# Patient Record
Sex: Female | Born: 1960 | Race: White | Hispanic: No | Marital: Married | Smoking: Never smoker
Health system: Southern US, Community
[De-identification: ages and names within clinical notes are randomized; demographics above are authoritative.]

## PROBLEM LIST (undated history)

## (undated) DIAGNOSIS — F319 Bipolar disorder, unspecified: Secondary | ICD-10-CM

## (undated) DIAGNOSIS — R112 Nausea with vomiting, unspecified: Secondary | ICD-10-CM

## (undated) DIAGNOSIS — Z9889 Other specified postprocedural states: Secondary | ICD-10-CM

## (undated) DIAGNOSIS — R51 Headache: Secondary | ICD-10-CM

## (undated) DIAGNOSIS — K5090 Crohn's disease, unspecified, without complications: Secondary | ICD-10-CM

## (undated) DIAGNOSIS — C50919 Malignant neoplasm of unspecified site of unspecified female breast: Principal | ICD-10-CM

## (undated) DIAGNOSIS — F99 Mental disorder, not otherwise specified: Secondary | ICD-10-CM

## (undated) DIAGNOSIS — G259 Extrapyramidal and movement disorder, unspecified: Secondary | ICD-10-CM

## (undated) DIAGNOSIS — I7300 Raynaud's syndrome without gangrene: Secondary | ICD-10-CM

## (undated) DIAGNOSIS — Z9221 Personal history of antineoplastic chemotherapy: Secondary | ICD-10-CM

## (undated) DIAGNOSIS — R5383 Other fatigue: Secondary | ICD-10-CM

## (undated) DIAGNOSIS — H8109 Meniere's disease, unspecified ear: Secondary | ICD-10-CM

## (undated) DIAGNOSIS — C50419 Malignant neoplasm of upper-outer quadrant of unspecified female breast: Secondary | ICD-10-CM

## (undated) DIAGNOSIS — K219 Gastro-esophageal reflux disease without esophagitis: Secondary | ICD-10-CM

## (undated) DIAGNOSIS — Z09 Encounter for follow-up examination after completed treatment for conditions other than malignant neoplasm: Secondary | ICD-10-CM

## (undated) DIAGNOSIS — Z923 Personal history of irradiation: Secondary | ICD-10-CM

## (undated) HISTORY — DX: Personal history of irradiation: Z92.3

## (undated) HISTORY — PX: SINUS EXPLORATION: SHX5214

## (undated) HISTORY — DX: Other fatigue: R53.83

## (undated) HISTORY — DX: Malignant neoplasm of unspecified site of unspecified female breast: C50.919

## (undated) HISTORY — DX: Malignant neoplasm of upper-outer quadrant of unspecified female breast: C50.419

## (undated) HISTORY — DX: Raynaud's syndrome without gangrene: I73.00

## (undated) HISTORY — DX: Mental disorder, not otherwise specified: F99

## (undated) HISTORY — PX: ABDOMINAL HYSTERECTOMY: SHX81

## (undated) HISTORY — DX: Extrapyramidal and movement disorder, unspecified: G25.9

## (undated) HISTORY — DX: Personal history of antineoplastic chemotherapy: Z92.21

## (undated) HISTORY — PX: OTHER SURGICAL HISTORY: SHX169

## (undated) HISTORY — DX: Crohn's disease, unspecified, without complications: K50.90

## (undated) HISTORY — DX: Meniere's disease, unspecified ear: H81.09

## (undated) HISTORY — PX: BREAST SURGERY: SHX581

## (undated) HISTORY — DX: Encounter for follow-up examination after completed treatment for conditions other than malignant neoplasm: Z09

## (undated) HISTORY — PX: BLADDER SUSPENSION: SHX72

---

## 1997-08-19 ENCOUNTER — Ambulatory Visit (HOSPITAL_BASED_OUTPATIENT_CLINIC_OR_DEPARTMENT_OTHER): Admission: RE | Admit: 1997-08-19 | Discharge: 1997-08-19 | Payer: Self-pay | Admitting: *Deleted

## 1998-03-28 ENCOUNTER — Other Ambulatory Visit: Admission: RE | Admit: 1998-03-28 | Discharge: 1998-03-28 | Payer: Self-pay | Admitting: Obstetrics and Gynecology

## 2000-08-28 ENCOUNTER — Other Ambulatory Visit: Admission: RE | Admit: 2000-08-28 | Discharge: 2000-08-28 | Payer: Self-pay | Admitting: Obstetrics and Gynecology

## 2001-09-29 ENCOUNTER — Other Ambulatory Visit: Admission: RE | Admit: 2001-09-29 | Discharge: 2001-09-29 | Payer: Self-pay | Admitting: Family Medicine

## 2002-04-15 ENCOUNTER — Emergency Department (HOSPITAL_COMMUNITY): Admission: EM | Admit: 2002-04-15 | Discharge: 2002-04-15 | Payer: Self-pay | Admitting: Emergency Medicine

## 2002-11-10 ENCOUNTER — Encounter (INDEPENDENT_AMBULATORY_CARE_PROVIDER_SITE_OTHER): Payer: Self-pay | Admitting: Specialist

## 2002-11-10 ENCOUNTER — Ambulatory Visit (HOSPITAL_COMMUNITY): Admission: RE | Admit: 2002-11-10 | Discharge: 2002-11-10 | Payer: Self-pay | Admitting: Gastroenterology

## 2002-12-28 ENCOUNTER — Encounter: Admission: RE | Admit: 2002-12-28 | Discharge: 2003-02-03 | Payer: Self-pay | Admitting: Sports Medicine

## 2003-03-28 ENCOUNTER — Ambulatory Visit: Admission: RE | Admit: 2003-03-28 | Discharge: 2003-03-28 | Payer: Self-pay | Admitting: Urology

## 2003-05-13 ENCOUNTER — Ambulatory Visit (HOSPITAL_COMMUNITY): Admission: RE | Admit: 2003-05-13 | Discharge: 2003-05-13 | Payer: Self-pay | Admitting: Urology

## 2003-08-16 ENCOUNTER — Observation Stay (HOSPITAL_COMMUNITY): Admission: RE | Admit: 2003-08-16 | Discharge: 2003-08-18 | Payer: Self-pay | Admitting: Urology

## 2003-08-16 ENCOUNTER — Encounter (INDEPENDENT_AMBULATORY_CARE_PROVIDER_SITE_OTHER): Payer: Self-pay | Admitting: Specialist

## 2003-09-10 ENCOUNTER — Emergency Department (HOSPITAL_COMMUNITY): Admission: EM | Admit: 2003-09-10 | Discharge: 2003-09-10 | Payer: Self-pay | Admitting: *Deleted

## 2003-09-26 ENCOUNTER — Encounter (INDEPENDENT_AMBULATORY_CARE_PROVIDER_SITE_OTHER): Payer: Self-pay | Admitting: Specialist

## 2003-09-26 ENCOUNTER — Emergency Department (HOSPITAL_COMMUNITY): Admission: EM | Admit: 2003-09-26 | Discharge: 2003-09-26 | Payer: Self-pay | Admitting: Emergency Medicine

## 2003-11-25 ENCOUNTER — Encounter (INDEPENDENT_AMBULATORY_CARE_PROVIDER_SITE_OTHER): Payer: Self-pay | Admitting: *Deleted

## 2003-11-25 ENCOUNTER — Encounter: Admission: RE | Admit: 2003-11-25 | Discharge: 2003-11-25 | Payer: Self-pay | Admitting: Family Medicine

## 2003-11-25 ENCOUNTER — Other Ambulatory Visit: Admission: RE | Admit: 2003-11-25 | Discharge: 2003-11-25 | Payer: Self-pay | Admitting: Diagnostic Radiology

## 2003-12-15 ENCOUNTER — Encounter (INDEPENDENT_AMBULATORY_CARE_PROVIDER_SITE_OTHER): Payer: Self-pay | Admitting: Specialist

## 2003-12-15 ENCOUNTER — Ambulatory Visit (HOSPITAL_COMMUNITY): Admission: RE | Admit: 2003-12-15 | Discharge: 2003-12-15 | Payer: Self-pay | Admitting: General Surgery

## 2003-12-15 ENCOUNTER — Ambulatory Visit (HOSPITAL_BASED_OUTPATIENT_CLINIC_OR_DEPARTMENT_OTHER): Admission: RE | Admit: 2003-12-15 | Discharge: 2003-12-15 | Payer: Self-pay | Admitting: General Surgery

## 2004-10-16 ENCOUNTER — Encounter: Admission: RE | Admit: 2004-10-16 | Discharge: 2004-10-16 | Payer: Self-pay | Admitting: Gastroenterology

## 2004-11-27 ENCOUNTER — Observation Stay (HOSPITAL_COMMUNITY): Admission: RE | Admit: 2004-11-27 | Discharge: 2004-11-28 | Payer: Self-pay | Admitting: *Deleted

## 2004-11-27 ENCOUNTER — Encounter (INDEPENDENT_AMBULATORY_CARE_PROVIDER_SITE_OTHER): Payer: Self-pay | Admitting: *Deleted

## 2006-01-05 ENCOUNTER — Emergency Department (HOSPITAL_COMMUNITY): Admission: EM | Admit: 2006-01-05 | Discharge: 2006-01-05 | Payer: Self-pay | Admitting: Emergency Medicine

## 2007-02-11 ENCOUNTER — Ambulatory Visit: Payer: Self-pay | Admitting: Psychiatry

## 2007-02-11 ENCOUNTER — Emergency Department (HOSPITAL_COMMUNITY): Admission: EM | Admit: 2007-02-11 | Discharge: 2007-02-11 | Payer: Self-pay | Admitting: *Deleted

## 2007-02-11 ENCOUNTER — Inpatient Hospital Stay (HOSPITAL_COMMUNITY): Admission: AD | Admit: 2007-02-11 | Discharge: 2007-02-19 | Payer: Self-pay | Admitting: Psychiatry

## 2007-02-17 ENCOUNTER — Emergency Department (HOSPITAL_COMMUNITY): Admission: EM | Admit: 2007-02-17 | Discharge: 2007-02-17 | Payer: Self-pay | Admitting: Emergency Medicine

## 2007-02-23 ENCOUNTER — Other Ambulatory Visit (HOSPITAL_COMMUNITY): Admission: RE | Admit: 2007-02-23 | Discharge: 2007-03-02 | Payer: Self-pay | Admitting: Psychiatry

## 2008-01-05 ENCOUNTER — Emergency Department (HOSPITAL_BASED_OUTPATIENT_CLINIC_OR_DEPARTMENT_OTHER): Admission: EM | Admit: 2008-01-05 | Discharge: 2008-01-05 | Payer: Self-pay | Admitting: Emergency Medicine

## 2008-01-05 ENCOUNTER — Ambulatory Visit: Payer: Self-pay | Admitting: Psychiatry

## 2008-01-05 ENCOUNTER — Inpatient Hospital Stay (HOSPITAL_COMMUNITY): Admission: AD | Admit: 2008-01-05 | Discharge: 2008-01-07 | Payer: Self-pay | Admitting: Psychiatry

## 2008-09-08 ENCOUNTER — Ambulatory Visit: Payer: Self-pay | Admitting: Radiology

## 2008-09-08 ENCOUNTER — Emergency Department (HOSPITAL_BASED_OUTPATIENT_CLINIC_OR_DEPARTMENT_OTHER): Admission: EM | Admit: 2008-09-08 | Discharge: 2008-09-08 | Payer: Self-pay | Admitting: Emergency Medicine

## 2010-05-05 LAB — URINE MICROSCOPIC-ADD ON

## 2010-05-05 LAB — URINALYSIS, ROUTINE W REFLEX MICROSCOPIC
Glucose, UA: NEGATIVE mg/dL
Ketones, ur: >80 mg/dL — AB
Leukocytes, UA: NEGATIVE
Nitrite: NEGATIVE
Protein, ur: 100 mg/dL — AB
Specific Gravity, Urine: 1.029 (ref 1.005–1.030)
Urobilinogen, UA: 0.2 mg/dL (ref 0.0–1.0)
pH: 6 (ref 5.0–8.0)

## 2010-05-05 LAB — POCT TOXICOLOGY PANEL
Benzodiazepines: POSITIVE
TCA Scrn: POSITIVE

## 2010-05-05 LAB — SALICYLATE LEVEL: Salicylate Lvl: <1 mg/dL — ABNORMAL LOW (ref 2.8–20.0)

## 2010-05-05 LAB — ETHANOL: Alcohol, Ethyl (B): <5 mg/dL (ref 0–10)

## 2010-05-05 LAB — BASIC METABOLIC PANEL
BUN: 19 mg/dL (ref 6–23)
CO2: 29 mEq/L (ref 19–32)
Calcium: 10.2 mg/dL (ref 8.4–10.5)
Chloride: 98 mEq/L (ref 96–112)
Creatinine, Ser: 0.8 mg/dL (ref 0.4–1.2)
GFR calc Af Amer: >60 mL/min (ref >60)
GFR calc non Af Amer: >60 mL/min (ref >60)
Glucose, Bld: 89 mg/dL (ref 70–99)
Potassium: 4 mEq/L (ref 3.5–5.1)
Sodium: 141 mEq/L (ref 135–145)

## 2010-05-05 LAB — ACETAMINOPHEN LEVEL: Acetaminophen (Tylenol), Serum: <10 ug/mL — ABNORMAL LOW (ref 10–30)

## 2010-05-05 LAB — DIFFERENTIAL
Basophils Absolute: 0.3 10*3/uL — ABNORMAL HIGH (ref 0.0–0.1)
Basophils Relative: 3 % — ABNORMAL HIGH (ref 0–1)
Eosinophils Absolute: 0 10*3/uL (ref 0.0–0.7)
Eosinophils Relative: 0 % (ref 0–5)
Lymphocytes Relative: 14 % (ref 12–46)
Lymphs Abs: 1.6 10*3/uL (ref 0.7–4.0)
Monocytes Absolute: 1 10*3/uL (ref 0.1–1.0)
Monocytes Relative: 9 % (ref 3–12)
Neutro Abs: 8.1 10*3/uL — ABNORMAL HIGH (ref 1.7–7.7)
Neutrophils Relative %: 74 % (ref 43–77)

## 2010-05-05 LAB — CBC
HCT: 46.1 % — ABNORMAL HIGH (ref 36.0–46.0)
Hemoglobin: 15.9 g/dL — ABNORMAL HIGH (ref 12.0–15.0)
MCHC: 34.4 g/dL (ref 30.0–36.0)
MCV: 93.4 fL (ref 78.0–100.0)
Platelets: 401 10*3/uL — ABNORMAL HIGH (ref 150–400)
RBC: 4.94 MIL/uL (ref 3.87–5.11)
RDW: 12.4 % (ref 11.5–15.5)
WBC: 11 10*3/uL — ABNORMAL HIGH (ref 4.0–10.5)

## 2010-05-14 ENCOUNTER — Emergency Department (HOSPITAL_COMMUNITY)
Admission: EM | Admit: 2010-05-14 | Discharge: 2010-05-15 | Disposition: A | Discharge status: Other Acute Inpatient Hospital | Payer: Medicare Other | Source: Home / Self Care | Attending: Emergency Medicine | Admitting: Emergency Medicine

## 2010-05-14 ENCOUNTER — Ambulatory Visit (HOSPITAL_COMMUNITY)
Admission: RE | Admit: 2010-05-14 | Discharge: 2010-05-14 | Disposition: A | Discharge status: Home / Self Care | Payer: Medicare Other | Attending: Psychiatry | Admitting: Psychiatry

## 2010-05-14 DIAGNOSIS — F3113 Bipolar disorder, current episode manic without psychotic features, severe: Secondary | ICD-10-CM | POA: Insufficient documentation

## 2010-05-14 LAB — DIFFERENTIAL
Basophils Absolute: 0 10*3/uL (ref 0.0–0.1)
Basophils Relative: 0 % (ref 0–1)
Eosinophils Absolute: 0 10*3/uL (ref 0.0–0.7)
Eosinophils Relative: 0 % (ref 0–5)
Lymphocytes Relative: 24 % (ref 12–46)
Lymphs Abs: 1.8 10*3/uL (ref 0.7–4.0)
Monocytes Absolute: 0.7 10*3/uL (ref 0.1–1.0)
Monocytes Relative: 9 % (ref 3–12)
Neutro Abs: 5.1 10*3/uL (ref 1.7–7.7)
Neutrophils Relative %: 67 % (ref 43–77)

## 2010-05-14 LAB — COMPREHENSIVE METABOLIC PANEL
ALT: 15 U/L (ref 0–35)
AST: 23 U/L (ref 0–37)
Albumin: 4.1 g/dL (ref 3.5–5.2)
Alkaline Phosphatase: 36 U/L — ABNORMAL LOW (ref 39–117)
BUN: 15 mg/dL (ref 6–23)
CO2: 28 mEq/L (ref 19–32)
Calcium: 9.3 mg/dL (ref 8.4–10.5)
Chloride: 98 mEq/L (ref 96–112)
Creatinine, Ser: 0.83 mg/dL (ref 0.4–1.2)
GFR calc Af Amer: >60 mL/min (ref >60)
GFR calc non Af Amer: >60 mL/min (ref >60)
Glucose, Bld: 88 mg/dL (ref 70–99)
Potassium: 3.3 mEq/L — ABNORMAL LOW (ref 3.5–5.1)
Sodium: 138 mEq/L (ref 135–145)
Total Bilirubin: 1.3 mg/dL — ABNORMAL HIGH (ref 0.3–1.2)
Total Protein: 7.4 g/dL (ref 6.0–8.3)

## 2010-05-14 LAB — CBC
HCT: 42.1 % (ref 36.0–46.0)
Hemoglobin: 14.2 g/dL (ref 12.0–15.0)
MCH: 31.1 pg (ref 26.0–34.0)
MCHC: 33.7 g/dL (ref 30.0–36.0)
MCV: 92.1 fL (ref 78.0–100.0)
Platelets: 286 10*3/uL (ref 150–400)
RBC: 4.57 MIL/uL (ref 3.87–5.11)
RDW: 12.1 % (ref 11.5–15.5)
WBC: 7.7 10*3/uL (ref 4.0–10.5)

## 2010-05-14 LAB — RAPID URINE DRUG SCREEN, HOSP PERFORMED
Amphetamines: NOT DETECTED
Barbiturates: NOT DETECTED
Benzodiazepines: NOT DETECTED
Cocaine: NOT DETECTED
Opiates: NOT DETECTED
Tetrahydrocannabinol: NOT DETECTED

## 2010-05-14 LAB — ETHANOL: Alcohol, Ethyl (B): <5 mg/dL (ref 0–10)

## 2010-05-15 ENCOUNTER — Inpatient Hospital Stay (HOSPITAL_COMMUNITY)
Admission: AD | Admit: 2010-05-15 | Discharge: 2010-05-16 | DRG: 885 | Disposition: A | Discharge status: Home / Self Care | Payer: Medicare Other | Source: Ambulatory Visit | Attending: Psychiatry | Admitting: Psychiatry

## 2010-05-15 DIAGNOSIS — F3160 Bipolar disorder, current episode mixed, unspecified: Principal | ICD-10-CM

## 2010-05-15 DIAGNOSIS — I10 Essential (primary) hypertension: Secondary | ICD-10-CM

## 2010-05-15 DIAGNOSIS — Z6379 Other stressful life events affecting family and household: Secondary | ICD-10-CM

## 2010-05-15 DIAGNOSIS — F319 Bipolar disorder, unspecified: Secondary | ICD-10-CM

## 2010-05-15 DIAGNOSIS — Z91199 Patient's noncompliance with other medical treatment and regimen due to unspecified reason: Secondary | ICD-10-CM

## 2010-05-15 DIAGNOSIS — Z818 Family history of other mental and behavioral disorders: Secondary | ICD-10-CM

## 2010-05-15 DIAGNOSIS — Z9119 Patient's noncompliance with other medical treatment and regimen: Secondary | ICD-10-CM

## 2010-05-15 NOTE — Consult Note (Addendum)
NAME:  Vanessa Mills, Vanessa Mills             ACCOUNT NO.:  [id number]  MEDICAL RECORD NO.:  [id number]           PATIENT TYPE:  E  LOCATION:  WLED                         FACILITY:  [facility]  PHYSICIAN:  Marlou Sa, MD DATE OF BIRTH:  1960-09-15  DATE OF CONSULTATION:  05/14/2010 DATE OF DISCHARGE:                                CONSULTATION   REASON FOR CONSULTATION:  Psychotic episode.  HISTORY OF PRESENT ILLNESS:  I saw the patient and reviewed the medical records.  Briefly, 50 year old white female with history of bipolar disorder, followed by Dr. Toy Care in the outpatient setting, came to the [facility], drove by herself, and she was not sleeping.  When the patient came to [facility] ED, she reported that she is hearing voices and she is also having visual hallucinations.  When I saw her, patient denied hearing any voices, denied seeing things. She told me it is just the sleep.  Because of that she was seeing things and hearing voices. She has not slept for the last few days.  The patient also told me that she is noncompliant with medication for the last 4 months.  She stopped taking medication without discussing with Dr. Toy Care.  She told me, "I do not think I need medication, that is why I stopped."  The patient also tried to run out of the ER.  The patient has a history of multiple suicide attempts in the past, one with carbon monoxide poisoning.  Denies any drug abuse.  The patient has multiple hospitalizations in the past, at [place].  MEDICAL ISSUES:  The patient has a history of; 1. Crohn disease. 2. Migraines.  LABORATORY DATA:  Within normal limits.  PHYSICAL EXAMINATION:  Within normal limits.  MENTAL STATUS EXAMINATION:  The patient is calm, cooperative during the interview.  Fair eye contact.  Mood, irritable. Affect, mood congruent. Thought process at the time of interview, logical and goal directed, but the patient is going through mini psychotic  episodes.  Thought content, the patient denies any suicidal or homicidal ideations.  No paranoid behavior reported.  The patient does not seem to be delusional at this time.  The patient denies hearing any voices currently, but earlier she reported hearing voices and seeing things.  The patient does not seem to be internally preoccupied.  Cognition, alert, awake, oriented x3. Memory; immediate, recent, remote fair. Attention and concentration good.  Abstraction ability fair.  Insight and judgment fair.  DIAGNOSES:  Axis I:  As per history bipolar disorder with psychotic features. Axis II:  Deferred. Axis III:  Crohn disease, history of migraine. Axis IV:  Noncompliant with medication for the last 4 months. Axis V:  40-50.  RECOMMENDATIONS: 1. The patient will be started on Depakote ER 1500 mg p.o. daily. 2. Saphris 10 mg p.o. daily and Benadryl 50 mg at bedtime. 3. The patient will be admitted to [facility] for further     observation and stabilization.     Marlou Sa, MD     SA/MEDQ  D:  05/15/2010  T:  05/15/2010  Job:  [id number]  Electronically Signed by Marlou Sa  on 05/15/2010  06:51:42 PM

## 2010-05-16 ENCOUNTER — Emergency Department (INDEPENDENT_AMBULATORY_CARE_PROVIDER_SITE_OTHER): Payer: Medicare Other

## 2010-05-16 ENCOUNTER — Emergency Department (HOSPITAL_BASED_OUTPATIENT_CLINIC_OR_DEPARTMENT_OTHER)
Admission: EM | Admit: 2010-05-16 | Discharge: 2010-05-17 | Disposition: A | Discharge status: Home / Self Care | Payer: Medicare Other | Source: Home / Self Care | Attending: Emergency Medicine | Admitting: Emergency Medicine

## 2010-05-16 DIAGNOSIS — S1093XA Contusion of unspecified part of neck, initial encounter: Secondary | ICD-10-CM

## 2010-05-16 DIAGNOSIS — W108XXA Fall (on) (from) other stairs and steps, initial encounter: Secondary | ICD-10-CM

## 2010-05-16 DIAGNOSIS — S01119A Laceration without foreign body of unspecified eyelid and periocular area, initial encounter: Secondary | ICD-10-CM | POA: Insufficient documentation

## 2010-05-16 DIAGNOSIS — F411 Generalized anxiety disorder: Secondary | ICD-10-CM | POA: Insufficient documentation

## 2010-05-16 DIAGNOSIS — Z79899 Other long term (current) drug therapy: Secondary | ICD-10-CM | POA: Insufficient documentation

## 2010-05-16 DIAGNOSIS — Y92009 Unspecified place in unspecified non-institutional (private) residence as the place of occurrence of the external cause: Secondary | ICD-10-CM | POA: Insufficient documentation

## 2010-05-16 DIAGNOSIS — S0003XA Contusion of scalp, initial encounter: Secondary | ICD-10-CM

## 2010-05-16 DIAGNOSIS — K5090 Crohn's disease, unspecified, without complications: Secondary | ICD-10-CM | POA: Insufficient documentation

## 2010-05-16 DIAGNOSIS — S0280XA Fracture of other specified skull and facial bones, unspecified side, initial encounter for closed fracture: Secondary | ICD-10-CM | POA: Insufficient documentation

## 2010-05-16 DIAGNOSIS — M549 Dorsalgia, unspecified: Secondary | ICD-10-CM

## 2010-05-16 DIAGNOSIS — M25579 Pain in unspecified ankle and joints of unspecified foot: Secondary | ICD-10-CM | POA: Insufficient documentation

## 2010-05-16 DIAGNOSIS — M79609 Pain in unspecified limb: Secondary | ICD-10-CM

## 2010-05-16 DIAGNOSIS — F3160 Bipolar disorder, current episode mixed, unspecified: Secondary | ICD-10-CM

## 2010-05-16 LAB — BASIC METABOLIC PANEL
BUN: 17 mg/dL (ref 6–23)
CO2: 29 mEq/L (ref 19–32)
Calcium: 10 mg/dL (ref 8.4–10.5)
Chloride: 101 mEq/L (ref 96–112)
Creatinine, Ser: 0.93 mg/dL (ref 0.4–1.2)
GFR calc Af Amer: >60 mL/min (ref >60)
GFR calc non Af Amer: >60 mL/min (ref >60)
Glucose, Bld: 77 mg/dL (ref 70–99)
Potassium: 4 mEq/L (ref 3.5–5.1)
Sodium: 140 mEq/L (ref 135–145)

## 2010-05-17 NOTE — H&P (Addendum)
NAME:  Vanessa Mills, Vanessa Mills             ACCOUNT NO.:  [id number]  MEDICAL RECORD NO.:  [id number]           PATIENT TYPE:  I  LOCATION:  0500                          [facility]  [facility]  PHYSICIAN:  Carloyn Jaeger, MD     DATE OF BIRTH:  17-Jan-1961  DATE OF ADMISSION:  05/15/2010 DATE OF DISCHARGE:  05/16/2010                      PSYCHIATRIC ADMISSION ASSESSMENT   CHIEF COMPLAINT:  "I was hearing voices."  HISTORY OF PRESENT ILLNESS:  Vanessa Mills is a 50 year old married white female, who was admitted to [facility] on May 15, 2010, after stopping some of her medications for approximately 6 months and beginning to experience a return of her psychotic symptoms.  The patient states that she takes the medication Saphris at 10 mg a day, but with attempting to decrease the medication when her symptoms returned.  Since decreasing the medication, she reports that she was having initiating and maintaining sleep x4 weeks.  She reports a good appetite, but states that prior to admission she was experiencing moderate feelings of sadness, anhedonia and depressed mood.  She denied any suicidal or homicidal ideations, as well as any visual hallucinations.  She, however, reports that just prior to admission she was experiencing auditory hallucinations that were "vague."  She denied any command hallucinations instructing her either to harm herself or others.  The patient reports that she had been at the [facility] emergency room for 2 days prior to admission and, therefore, has been back on her psychiatric medications with good results.  Today, the patient denies any suicidal or homicidal ideations, as well as any auditory or visual hallucinations or delusional thinking.  Staff does report that she has expressed concerns that she "must cleanse herself."  However, after staff discussed these symptoms with the patient and her husband, both are interested in her discharge today stating that  remaining in the hospital is a "detriment" to her getting better due to her not being able to sleep well while on the unit.  Since the patient is not expressing any thoughts of harming herself or others and says her symptoms are under fair to good control, she will be discharged today as requested.  She has a return appointment with her outpatient psychiatrist tomorrow at 1 p.m.  PAST PSYCHIATRIC HISTORY:  The patient reports that she has at least 5-6 past psychiatric hospitalizations secondary to psychiatric symptoms. The patient reports that she sees Dr. Toy Care as an outpatient in [place] and is satisfied with her care with Dr. Toy Care.  The patient again has an appointment with her outpatient psychiatrist tomorrow at 1 p.m.  The patient denies any substance abuse related issues.  PAST MEDICAL HISTORY:  CURRENT MEDICATIONS: 1. Propranolol 60 mg p.o. b.i.d. 2. Klonopin 0.5 mg p.o. b.i.d. 3. Saphris sublingual 10 mg p.o. q.a.m. 4. Maxzide/Dyazide 37.5/25 mg tablets 1 tablet p.o. q.a.m. 5. Depakote 1500 mg p.o. nightly.  ALLERGIES:  NKDA.  PAST MEDICAL HISTORY:  Hypertension.  PAST OPERATIONS:  Not reported.  FAMILY HISTORY:  The patient states that her father died of congestive heart failure at the age of 84.  She reports that her mother is alive and is 60  years of age and has arthritis and diabetes.  She reports to having two sisters, one 31 years of age, who has "bipolar disorder," as well as abuses alcohol.  She states a second sister is 72 years of age and "lives in [place]" and sees a psychiatrist.  The patient's having one child 87 years of age, who is in good health.  SOCIAL HISTORY:  The patient was born in [place] and was raised in [place] and has lived in [place], [place] since 1978.  She states that she and her husband live at home.  She denies any use of tobacco products and reports very rare use of alcohol.  She denies any use of illicit  drugs.  MENTAL STATUS EXAM:  General:  The patient was alert and oriented x3. She was cooperative throughout the evaluation.  Speech was appropriate in rate and volume with no pressuring noted.  Mood appeared mildly depressed today.  Affect appeared mildly irritable.  Thoughts; the patient denied any auditory or visual hallucinations, as well as any delusional thinking.  She also denied any suicidal or homicidal ideations.  Judgement and insight both appeared fair.  IMPRESSION:  Axis I:  Bipolar disorder - mixed - currently under fair to good control. Axis II:  None noted. Axis III:  Hypertension. Axis IV:  Serious chronic mental illness.  Noncompliance with medications. Axis V:  Global Assessment of Functioning at time of admission approximately 35.  Global Assessment of Functioning at time of discharge approximately 60.  PLAN: 1. The patient was restarted on her psychiatric medications as     mentioned above with good results. 2. The patient was monitored for safety, as well as for her     psychiatric symptoms on a daily basis during her hospitalization. 3. Since the patient's symptoms are under fair to good control and she     is denying any thoughts of harming herself or others and since both     she and her husband are requesting discharge today, the patient was     discharged as requested. 4. The patient has a return appointment with her outpatient     psychiatrist, Dr. Toy Care tomorrow, Thursday, May 17, 2010, at 1     p.m.          ______________________________ Carloyn Jaeger, MD     RR/MEDQ  D:  05/16/2010  T:  05/16/2010  Job:  [id number]  Electronically Signed by Carloyn Jaeger MD on 05/17/2010 05:09:59 PM

## 2010-05-18 ENCOUNTER — Ambulatory Visit (HOSPITAL_COMMUNITY)
Admission: RE | Admit: 2010-05-18 | Discharge: 2010-05-18 | Disposition: A | Discharge status: Home / Self Care | Payer: Medicare Other | Attending: Psychiatry | Admitting: Psychiatry

## 2010-05-18 DIAGNOSIS — F312 Bipolar disorder, current episode manic severe with psychotic features: Secondary | ICD-10-CM | POA: Insufficient documentation

## 2010-05-19 ENCOUNTER — Emergency Department (HOSPITAL_COMMUNITY)
Admission: EM | Admit: 2010-05-19 | Discharge: 2010-05-19 | Disposition: A | Discharge status: Other Acute Inpatient Hospital | Payer: Medicare Other | Source: Home / Self Care | Attending: Emergency Medicine | Admitting: Emergency Medicine

## 2010-05-19 ENCOUNTER — Inpatient Hospital Stay (HOSPITAL_COMMUNITY)
Admission: RE | Admit: 2010-05-19 | Discharge: 2010-06-04 | DRG: 885 | Disposition: A | Discharge status: Home / Self Care | Payer: Medicare Other | Source: Ambulatory Visit | Attending: Psychiatry | Admitting: Psychiatry

## 2010-05-19 DIAGNOSIS — F312 Bipolar disorder, current episode manic severe with psychotic features: Principal | ICD-10-CM

## 2010-05-19 DIAGNOSIS — X80XXXA Intentional self-harm by jumping from a high place, initial encounter: Secondary | ICD-10-CM

## 2010-05-19 DIAGNOSIS — K5090 Crohn's disease, unspecified, without complications: Secondary | ICD-10-CM

## 2010-05-19 DIAGNOSIS — G43909 Migraine, unspecified, not intractable, without status migrainosus: Secondary | ICD-10-CM

## 2010-05-19 DIAGNOSIS — I10 Essential (primary) hypertension: Secondary | ICD-10-CM

## 2010-05-19 DIAGNOSIS — Z9119 Patient's noncompliance with other medical treatment and regimen: Secondary | ICD-10-CM

## 2010-05-19 DIAGNOSIS — G252 Other specified forms of tremor: Secondary | ICD-10-CM

## 2010-05-19 DIAGNOSIS — G250 Essential tremor: Secondary | ICD-10-CM

## 2010-05-19 DIAGNOSIS — Z91199 Patient's noncompliance with other medical treatment and regimen due to unspecified reason: Secondary | ICD-10-CM

## 2010-05-19 DIAGNOSIS — S0230XA Fracture of orbital floor, unspecified side, initial encounter for closed fracture: Secondary | ICD-10-CM

## 2010-05-19 DIAGNOSIS — F29 Unspecified psychosis not due to a substance or known physiological condition: Secondary | ICD-10-CM | POA: Insufficient documentation

## 2010-05-19 LAB — DIFFERENTIAL
Basophils Relative: 0 % (ref 0–1)
Eosinophils Absolute: 0 10*3/uL (ref 0.0–0.7)
Eosinophils Relative: 1 % (ref 0–5)
Lymphs Abs: 1.4 10*3/uL (ref 0.7–4.0)
Monocytes Absolute: 0.8 10*3/uL (ref 0.1–1.0)
Monocytes Relative: 12 % (ref 3–12)

## 2010-05-19 LAB — CBC
MCH: 31.3 pg (ref 26.0–34.0)
MCHC: 33.7 g/dL (ref 30.0–36.0)
MCV: 92.9 fL (ref 78.0–100.0)
Platelets: 206 10*3/uL (ref 150–400)

## 2010-05-19 LAB — COMPREHENSIVE METABOLIC PANEL
ALT: 11 U/L (ref 0–35)
AST: 15 U/L (ref 0–37)
Albumin: 3.5 g/dL (ref 3.5–5.2)
CO2: 27 mEq/L (ref 19–32)
Calcium: 8.8 mg/dL (ref 8.4–10.5)
Chloride: 101 mEq/L (ref 96–112)
Creatinine, Ser: 0.92 mg/dL (ref 0.4–1.2)
GFR calc Af Amer: >60 mL/min (ref >60)
GFR calc non Af Amer: >60 mL/min (ref >60)
Sodium: 140 mEq/L (ref 135–145)
Total Bilirubin: 0.6 mg/dL (ref 0.3–1.2)

## 2010-05-19 LAB — RAPID URINE DRUG SCREEN, HOSP PERFORMED
Barbiturates: NOT DETECTED
Benzodiazepines: POSITIVE — AB

## 2010-05-19 LAB — ETHANOL: Alcohol, Ethyl (B): 69 mg/dL — ABNORMAL HIGH (ref 0–10)

## 2010-05-21 ENCOUNTER — Other Ambulatory Visit (HOSPITAL_COMMUNITY): Payer: Medicare Other

## 2010-05-26 LAB — VALPROIC ACID LEVEL: Valproic Acid Lvl: 53.7 ug/mL (ref 50.0–100.0)

## 2010-05-26 LAB — COMPREHENSIVE METABOLIC PANEL
ALT: 12 U/L (ref 0–35)
AST: 17 U/L (ref 0–37)
CO2: 32 mEq/L (ref 19–32)
Chloride: 96 mEq/L (ref 96–112)
GFR calc Af Amer: >60 mL/min (ref >60)
GFR calc non Af Amer: >60 mL/min (ref >60)
Glucose, Bld: 96 mg/dL (ref 70–99)
Sodium: 138 mEq/L (ref 135–145)
Total Bilirubin: 0.7 mg/dL (ref 0.3–1.2)

## 2010-05-28 ENCOUNTER — Other Ambulatory Visit (HOSPITAL_COMMUNITY): Payer: Medicare Other

## 2010-05-29 ENCOUNTER — Ambulatory Visit (HOSPITAL_COMMUNITY): Payer: Medicare Other

## 2010-06-04 DIAGNOSIS — F29 Unspecified psychosis not due to a substance or known physiological condition: Secondary | ICD-10-CM

## 2010-06-04 DIAGNOSIS — F319 Bipolar disorder, unspecified: Secondary | ICD-10-CM

## 2010-06-08 NOTE — Discharge Summary (Signed)
NAME:  Vanessa Mills, Vanessa Mills             ACCOUNT NO.:  [id number]  MEDICAL RECORD NO.:  [id number]           PATIENT TYPE:  I  LOCATION:  0401                          [facility]ACILITY:  [facility]  PHYSICIAN:  Marlou Sa, MD DATE OF BIRTH:  10/29/1960  DATE OF ADMISSION:  05/19/2010 DATE OF DISCHARGE:  06/04/2010                              DISCHARGE SUMMARY   IDENTIFYING INFORMATION:  This is a 49 year old Caucasian female, married.  This is an involuntary admission.  HISTORY OF PRESENT ILLNESS:  This was the second recent admission for Vanessa Mills who had been previously on our unit from April 17 to April 18 under the care of Dr. Louie Casa Readling.  On this occasion, she presented after two visits to our emergency room.  She had been seen there on April 18 after going out of a window on to the deck below and stated she thought she was jumping into the arms of Jesus.  She appeared to be responding to internal stimuli in the emergency room, hearing voices, talking about how glorious it would be to go to heaven.  Vanessa Mills has a history of bipolar disorder and is currently followed by Dr. Chucky May, and has a history of 5-6 previous hospitalizations. On a recent admission here, she had reported a history of recent problems with insomnia and some mood fluctuation.  She initially presented with poor eye contact.  Speech soft and slow thinking tangential and exhibited some thought blocking.  She acknowledged auditory hallucinations.  Insight and judgment significantly impaired.  MEDICAL EVALUATION AND DIAGNOSTIC STUDIES:  Full physical exam was done in the emergency room.  Her husband reported in the ER that she had been "wandering and was agitated and confused.  This is a slim built Caucasian female with right eye and facial bruising who had been previously evaluated in [facility] emergency room where she refused a CT scan citing financial reasons, but agreed to plain films which showed  a possible right orbital blowout fracture.  In this evaluation, she was noted to have a normal CBC with hemoglobin 12.4, hematocrit 36.8, platelets 206,000.  Urine drug screen was positive for benzodiazepines. Chemistry normal.  BUN 16, creatinine 0.92.  Liver enzymes normal. Alcohol level 69 and a valproate level 104.1.  At the time of exam, she was noted to have significant bruising around her arms and extremities in addition to her facial bruising.  Admitting vital signs temperature 98.7, pulse 74, respirations 16, blood pressure 118/69 with a pulse oximetry of 98%.  Her husband had reported that she does not use alcohol regularly.  COURSE OF HOSPITALIZATION:  She was admitted to our acute stabilization and evaluation unit and she initially presented bright, up and active, stating I feel great and ready to go home, although it was clear that she had little insight into her underlying illness.  She was restarted on her home medications of triamterene HCTZ 37.5/2.5 mg daily, Depakote ER 500 mg 2 tablets p.o. q.h.s. and Saphris 10 mg 1 tablet daily.  She was also restarted on propranolol 60 mg b.i.d. which is prescribed for her chronic tremor possibly benign essential tremor, unconfirmed. Eventually,  her triamterene HCTZ was completely discontinued as her p.o. intake was variable.  We did not restart her alprazolam but added Klonopin 2 mg on a one-time basis which seemed helpful, and we continued her on a Klonopin dose throughout her stay here.  Vanessa Mills reported that she had had insomnia for about 4 weeks preceding the onset of auditory hallucinations.  The hallucinations were troublesome to her as she complained that they were "not with God."  She was given an initial working diagnosis of bipolar disorder with psychotic features, rule out schizoaffective disorder and for the first 2 weeks on our unit was significantly psychotic with religious preoccupation and impaired p.o. intake.   We monitored her food and fluid intake on a regular basis, and her Risperdal was increased from 2 mg p.o. q.h.s. to 4 mg at bedtime.  Her Klonopin was titrated to 1 mg p.o. q.h.s.  Her Depakote was eventually discontinued when her level went to 139.3 on May 27.  Throughout her stay, she consistently refused to have a maxillofacial CT scan for improved the imaging of her right facial trauma.  We offered her a consult with Dr. Melissa Montane, but she consistently refused this. Finally, on May 1, she agreed to the CT scan which was performed that showed a prominent right medial orbital floor blowout fracture with an inferior displacement at 11 mm.  Ultimately, she requested to make her own outpatient ENT consult arrangements to which we agreed.  Our case manager worked with her husband who visited frequently and demonstrated support.  He was bringing her favorite drinks and food and monitored her care.  Risperdal was ultimately increased to 6 mg p.o. q.h.s. on May 3.  At that point, her Depakote was still discontinued. By May 7, she was stable for discharge with no more delusional thinking. Insight was satisfactory that she could explain to Dr. Sherlynn Stalls that she recognized that her thinking had been disorder.  She reported she had spoken with her husband who was also in agreement.  Her hygiene was improved.  Clothing and grooming in order.  Polite with good eye contact and interacting appropriately.  DISCHARGE/PLAN:  Follow up with Dr. Chucky May on Friday Jun 08, 2010, at 11:45 a.m.  She was given information on how to access ear, nose and throat consultation.  DISCHARGE MEDICATIONS: 1. Klonopin 1 mg 2 tablets q.h.s. 2. Risperdal 6 mg p.o. q.h.s. 3. Propranolol 60 mg b.i.d. 4. She was instructed to discontinue Depakote, triamterene/HCTZ,     Saphris and alprazolam.  DISCHARGE DIAGNOSIS:  AXIS I:  Bipolar disorder with psychotic features, manic, stabilized. AXIS II:  No  diagnosis. AXIS III:  Chronic tremor NOS, inferior right orbital blowout fracture. AXIS IV:  Supportive marriage and stable home as an asset. AXIS V: Current 60, past year 27 estimated.     Margaret A. Nicki Reaper, N.P.   ______________________________ Marlou Sa, MD    MAS/MEDQ  D:  06/06/2010  T:  06/06/2010  Job:  [id number]  Electronically Signed by Lanell Persons N.P. on 06/06/2010 03:34:56 PM Electronically Signed by Marlou Sa  on 06/08/2010 09:37:14 AM

## 2010-06-12 NOTE — Discharge Summary (Signed)
NAME:  Vanessa Mills, Vanessa Mills NO.:  [id number]   MEDICAL RECORD NO.:  [id number]          PATIENT TYPE:  IPS   LOCATION:  0407                          FACILITY:  [facility]   PHYSICIAN:  Norm Salt, MD  DATE OF BIRTH:  [birth year]   DATE OF ADMISSION:  02/11/2007  DATE OF DISCHARGE:  02/19/2007                               DISCHARGE SUMMARY   IDENTIFYING DATA AND REASON FOR ADMISSION:  This was an inpatient  psychiatric admission for Vanessa Mills, a 50 year old married white female  admitted due to mental status changes consistent with psychosis.  Please  refer to the admission note for further details pertaining to the  symptoms, circumstances and history that led to her hospitalization.  She was given an initial Axis I diagnosis of rule out bipolar affective  disorder, manic with psychosis.   MEDICAL LABORATORY:  The patient was medically and physically assessed  by the psychiatric nurse practitioner.  She was in good health without  any active or chronic medical problems, with the exception of Crohn's  disease, for which she had been taking Pentasa.  She was continued on  her usual dose of 2000 mg b.i.d.   She was tested for a urinary tract infection, but did not appear to have  this.  She was sent to the [facility] emergency department at  one point, due to dehydration that appeared to be the result of  diarrhea.  She was hydrated and given supplemental potassium.  She was  also given zinc oxide, due to excoriation around the perineal, rectal  area.  She was also given Imitrex for a headache, on one occasion during  her stay.   HOSPITAL COURSE:  The patient was admitted to the adult inpatient  psychiatric service.  She presented as a well-nourished, well-developed  woman who in the initial interview was alert, fully oriented, pleasant,  open, and highly verbal.  She was clearly delusional, with grandiose  thinking.  She was cheerful to the point of the  giddiness, extremely  inappropriate degree of affect for her situation.  She stated sleeping  has been difficult for me, but I feel just great.  She was clearly in  no distress.  The impression was that of manic psychosis.   We learned that the patient had recently had an overdose, and had also  been hospitalized at [facility] inpatient service, sometime during  2008.  She had been seeing Dr. Toy Care, a psychiatrist, and had been  treated with Topamax and Xanax.   We began a regimen of Risperdal and Depakote, to address a bipolar mania  with psychosis.  Initially, the patient was resistant to taking  medication.  She was religiously pre-occupied, and spoke about the book  of revelations, and the rapture coming.  She could not accept  explanations that she appeared to have a significant psychiatric  illness.   The undersigned and the case manager met with the patient's husband, to  obtain more history and discussed treatment needs and course.   The husband indicated that there is a positive family history for  bipolar disorder,  within Vanessa Mills's family.  The patient's husband gave  further recent history that was completely consistent with that of manic  psychosis.   The patient eventually did begin taking her medication on a regular  basis, and over the next few days, stabilized nicely.  By the time of  discharge, the patient was agreeable to continuing treatment in our  intensive outpatient program, and continuing outpatient medication  regimen.  On the day of discharge, there was a family session involving  the patient and her husband.  Her husband remarked in that meeting that  he felt good about her discharge.  The patient made statements in that  meeting that she intended stay on medication and that she would return  tomorrow morning for the intensive outpatient program.   The patient and her husband did discuss the possibility of divorce,  which apparently was accepted rather  well by the husband.  They agreed  that would not discuss the end of their marriage, until after their  daughter's birthday coming up in a couple of weeks.  They were both  quite supportive of one another.  The patient was discharged following  this.   AFTERCARE:  The patient was to follow up with Dr. Toy Care, with an  appointment on February 24, 2007.  She was to return the following  morning, February 20, 2007, for the [facility] intensive outpatient  psychiatric program.   DISCHARGE MEDICATIONS:  1. Risperdal 2 mg q.h.s.  2. Depakote 1000 mg q.h.s.  3. Topamax 50 mg b.i.d.  4. Pentasa 2000 mg b.i.d..   DISCHARGE DIAGNOSES:  Axis I:  Bipolar affective disorder, most recently  manic with psychotic features, resolving.  Axis II:  Deferred.  Axis III:  History of Crohn's disease.  Axis IV:  Stressors severe.  Axis V: GAF on discharge 63.      Norm Salt, MD  Electronically Signed     SPB/MEDQ  D:  02/20/2007  T:  02/20/2007  Job:  [id number]

## 2010-06-12 NOTE — H&P (Signed)
NAME:  Vanessa Mills, Vanessa Mills             ACCOUNT NO.:  [id number]   MEDICAL RECORD NO.:  [id number]          PATIENT TYPE:  IPS   LOCATION:  0402                          FACILITY:  [facility]   PHYSICIAN:  Norm Salt, MD  DATE OF BIRTH:  [birth year]   DATE OF ADMISSION:  02/11/2007  DATE OF DISCHARGE:                       PSYCHIATRIC ADMISSION ASSESSMENT   TIME:  1510.   IDENTIFYING INFORMATION:  A 50 year old married white female as an  involuntary admission.   HISTORY OF PRESENT ILLNESS:  This patient presents on petition by her  family after 1-1/2 weeks of some increasingly bizarre behavior.  She had  a lot of hyper-religious thoughts, believing in the coming  rapture,  writing lengthy notes and instructions to her family, believing that the  world is coming to end and that the anti-Christ is coming.  She had been  taking excessive amounts of Xanax.  Had reported to her husband that she  had overdosed on Xanax and alcohol last Friday.  Then having used up all  of her prescription, she attempted to get another prescription filled  early.  Husband had reported that her behavior had become a bit  aggressive, that she seemed to be interested in fighting with him.  She  continues to be preoccupied with the rapture today.  Has expressed no  overtly suicidal or homicidal thoughts.   PAST PSYCHIATRIC HISTORY:  No prior psychiatric admissions.  Sees  Chucky May, MD, here in [place], her psychiatrist and is  prescribed Xanax 1 mg, frequency unclear.   SOCIAL HISTORY:  Married white female living at home with her husband.  No known legal charges.   FAMILY HISTORY:  Not known.   ALCOHOL/DRUG HISTORY:  She has denied any regular substance abuse.   PRIMARY CARE [facility]:  Dr. Doy Mince.   CURRENT MEDICAL PROBLEMS:  Include Crohn's disease and migraine  headaches.  She also has a history of exercise-induced asthma.   PAST MEDICAL HISTORY:  1. Remarkable for a partial hysterectomy.  2. History of prolapsed bladder.   CURRENT MEDICATIONS:  1. Topamax 50 mg b.i.d.  2. Pentasa 500 mg 4 tablets b.i.d.  3. Xanax 1 mg q.a.m. and q.h.s.  4. At one point had been on nortriptyline 25 mg p.o. q.h.s. to help      with sleep.   DRUG ALLERGIES:  NONE.   REVIEW OF SYSTEMS:  She does give a limited review of systems today  because of her hypervigilance and flight of ideas.   PHYSICAL EXAMINATION:  GENERAL:  Physical examination done in the  emergency room.  Well-nourished, well-developed, healthy-appearing  female in no physical distress.  VITAL SIGNS:  5 feet 8 inches tall, 124 pounds, temperature 97.9, pulse  76, respirations 18, blood pressure 139/83.   LABORATORY DATA:  CBC:  WBC 6.7, hemoglobin 13.3, hematocrit 39.1 and  platelets 299,000.  Chemistry:  Sodium 140, potassium 4.1, chloride 108,  carbon dioxide 25, BUN 12, creatinine 0.86,  random glucose 103.  Liver  enzymes:  SGOT 18, SGPT 13, alkaline phosphatase 31, total bilirubin  0.9.  Alcohol level was 5.  Urine drug screen positive  for  benzodiazepines.   DIAGNOSTICS:  CT scan was done of her brain in the emergency room which  revealed no acute findings.   MENTAL STATUS EXAM:  A fully alert female with a hyper-vigilant affect,  bright, quick responses.  She continues to write furiously.  Has written  several long notes about instructions for her admission with many  references to [place] and plans to go to [place].  States today  that she is having real revelations, sees things, knows things that  others cannot see.  She is cooperative and directable.  Speech is  hyperverbal.  No pressure.  She is cheerful, grandiose, clearly  delusional, cheerful to an inappropriate degree.  Says that sleep has  been quite difficult for her, but she feels really great in no distress.  Thought process reveals a lot of hyper-religious thinking and  grandiosity.  Cognition is preserved.   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  Benzodiazepine  abuse.  Rule out dependence.  AXIS II:  Deferred.  AXIS III:  Crohn's disease and exercise-induced asthma by history.  Migraine headaches.  AXIS IV:  Deferred.  AXIS V:  Current 35, past year not known.   PLAN:  Involuntarily admit the patient with q.15 minute checks in place.  We have placed her on our intensive care unit.  I have started her on a  Librium protocol to safely detox her from the of benzodiazepines.  We  have also started her on Depakote ER 1000 mg p.o. q.h.s. and Risperdal 2  mg p.o. q.h.s. and 1 mg q.a.m.  We will continue her routine  medications, but are not giving her any nortriptyline or Xanax at this  point.  Estimated length of stay is 5 days.      Margaret A. Nicki Reaper, N.P.      Norm Salt, MD  Electronically Signed    MAS/MEDQ  D:  02/12/2007  T:  02/12/2007  Job:  [id number]

## 2010-06-15 NOTE — H&P (Signed)
NAME:  Vanessa Mills, Vanessa Mills NO.:  [id number]   MEDICAL RECORD NO.:  [id number]                   PATIENT TYPE:  EMS   LOCATION:  MAJO                                 FACILITY:  [facility]   PHYSICIAN:  Jeryl Columbia, M.D.                 DATE OF BIRTH:  Dec 31, 1960   DATE OF ADMISSION:  09/26/2003  DATE OF DISCHARGE:                                HISTORY & PHYSICAL   HISTORY:  Vanessa Mills is a long-term patient of mine with very mild Crohn's  disease.  She has not had many symptoms of late, has actually been feeling  good.  She has had some compliance issues in the past.  A colonoscopy  roughly a year ago was normal.  She, about 1-2 months ago, had tubal  ligation and a bladder tack by Dr. Reece Agar and her gynecologist which was  complicated by some infection and she was on Cipro for a moderate amount of  time.  She did have some increased diarrhea with increased abdominal cramps,  has been feeling lousy, has not had any injury.  Her cramps do get better  when she passes her bowels.  She has also seen some increased bright red  blood per rectum of late.  She has had some low grade fevers but denies any  other complaints like skin lesions, rashes, etc.  She had called me and we  discussed these symptoms.  Phenergan and Zofran may help her nausea a little  bit and, based on being on prolonged Cipro, we discussed options on the  phone late Friday afternoon and decided to put her on empiric Flagyl just in  case which really has not helped, 500 t.i.d.  I did offer her ER evaluation  by my partner this weekend, Dr. Oletta Lamas, for walk in evaluation with  electrolytes and IV fluids and a decision regarding admission at that  junction.   PAST MEDICAL HISTORY:  Pertinent for the surgeries as above but no chronic  medical problems.   FAMILY HISTORY:  Pertinent for no sick contents other than a family history  of colon polyps.  Grandmother had ulcerative colitis.   CURRENT  MEDICATIONS:  Flagyl, Zofran, and Topamax for some migraine  headaches which she has quit taking.   ALLERGIES:  None.   SOCIAL HISTORY:  She has not drank in some time.  She does not smoke.  She  minimizes over the counter medicine use.   REVIEW OF SYMPTOMS:  Negative except for above.   PHYSICAL EXAMINATION:  VITAL SIGNS:  See chart, afebrile.  LUNGS:  Clear.  HEART:  Regular rate and rhythm.  ABDOMEN:  Soft, nontender, good bowel sounds.   LABORATORY DATA:  Labs normal.  X-ray normal.   ASSESSMENT:  Increased diarrhea and some lower GI bleeding of questionable  etiology, patient with history of mild Crohn's disease, mother with  ulcerative colitis.   PLAN:  Go ahead and do an unprepped flex sig to evaluate for pseudomembranes  active colitis, etc., and decide further workup pending those findings.                                                Jeryl Columbia, M.D.    MEM/MEDQ  D:  09/26/2003  T:  09/26/2003  Job:  [id number]

## 2010-06-15 NOTE — Op Note (Signed)
NAME:  Vanessa Mills, Vanessa Mills NO.:  [id number]   MEDICAL RECORD NO.:  [id number]                   PATIENT TYPE:  OBV   LOCATION:  0361                                 FACILITY:  [facility]   PHYSICIAN:  Nelida Gores, M.D.             DATE OF BIRTH:  1960-09-27   DATE OF PROCEDURE:  08/16/2003  DATE OF DISCHARGE:                                 OPERATIVE REPORT   GYNECOLOGY:  Diona Foley, M.D.   UROLOGY:  Nelida Gores, M.D.   FAMILY PRACTICE:  Carola J. Jonny Ruiz, M.D.   PREOPERATIVE DIAGNOSIS:  Stress urinary incontinence.   POSTOPERATIVE DIAGNOSIS:  Stress urinary incontinence.   OPERATION PERFORMED:  Pubovaginal sling.   DRAINS:  6 French Foley catheter.   ANESTHESIA:  General.   DESCRIPTION OF PROCEDURE:  The patient was prepped and draped in the dorsal  lithotomy position after institution of an adequate level of general  anesthesia.  A 24 French Foley catheter with a 30 mL balloon was inserted.  The labia were then sewn back.  Anterior wall of the vagina was injected  with 0.25% lidocaine with epinephrine.  An 8 x 2.5 cm fascial sling was then  prepared with a helical stitch of 0 nylon at either end.  A U-shaped  incision in the vagina was then carried out covering basically the middle  portion to proximal third of the urethra.  Once an adequate flap had been  created, an index finger was used to dissect lateral to the urethra  posterior to the symphysis pubis, carried up to the posterior rectus sheath.  A similar technique was used on both the right and left sides.  A transverse  incision was made suprapubically, carried down to the fascia of the rectus  abdominis.  Lateral to the bladder neck using the index finger as a guide,  right and left Stamey needles were then passed alongside the bladder neck.  Once the needles were in place, indwelling catheter was removed.  Bladder  was carefully inspected with a 12 degree and 70 degree  lenses, no evidence  of bladder perforation was noted.  There was clear efflux at the right and  left ureteral orifice.  Ureteral catheter was passed easily at 15 cm on both  sides.  #1 nylon sutures were then passed through the eyelets of the Stamey  needle.  It was retracted superiorly with sling in position.  Tails of the  nylon suture were then tied down taking care to not place any undue pressure  on the sling.  The butt end of a DeBakey forcep was kept between the urethra  and the sling.  Tails of the sling were then sewn  together in the midline.  Subcutaneous tissue was reapproximated with 2-0  Vicryl.  The skin was reapproximated with skin staples.  2-0 Vicryl sutures  were then used to close the U-shaped incision in the vagina.  An 61 [place]  Foley catheter was left to straight drain.  Dr. Jeffie Pollock will dictate her  portion of the procedure.                                               Nelida Gores, M.D.    RH/MEDQ  D:  08/16/2003  T:  08/16/2003  Job:  [id number]   cc:   Diona Foley, M.D.  [address]  [place]  [place] [address]  Fax: [place]. Jonny Ruiz, M.D.  [place]  [place] [address]  Fax: [phone]

## 2010-06-15 NOTE — Op Note (Signed)
   NAME:  Vanessa Mills, Vanessa Mills                       ACCOUNT NO.:  [id number]   MEDICAL RECORD NO.:  [id number]                   PATIENT TYPE:  AMB   LOCATION:  ENDO                                 FACILITY:  [facility]   PHYSICIAN:  Jeryl Columbia, M.D.                 DATE OF BIRTH:  03-24-1960   DATE OF PROCEDURE:  11/10/2002  DATE OF DISCHARGE:                                 OPERATIVE REPORT   PROCEDURE:  Colonoscopy with biopsy.   INDICATIONS FOR PROCEDURE:  Patient with history of Crohn's, history of  colon polyps, here for repeat screening.   CONSENT:  Consent was signed after risks, benefits, and options were  thoroughly discussed in the office on multiple occasions.   MEDICATIONS USED:  Demerol 90, Versed 9.   PROCEDURE:  Rectal inspection was pertinent for external hemorrhoids.  Digital exam was negative.  The video pediatric adjustable colonoscope was  inserted and easily advanced around the colon to the cecum.  This did  require some abdominal pressure but no position changes, no obvious  abnormality was seen on insertion.  The cecum was identified by the  appendiceal orifice and the ileocecal valve.  The scope was inserted a short  ways into the terminal ileum which was normal.  Photodocumentation was  obtained.  The scope was slowly withdrawn, random biopsies of the TI were  obtained and put in the first container and random biopsies of the colon  were obtained and put in the second container.  No abnormalities were seen  on slow withdrawal back to the rectum.  The prep was adequate, there was  some liquid stool that required washing and suctioning.  Anorectal pull  through and retroflexion did reveal some minimal hemorrhoids.  The scope was  straightened and readvanced a short way up the left side of the colon, air  was suctioned, the scope was removed.  The patient tolerated the procedure  well.  There was no obvious complications.   ENDOSCOPIC DIAGNOSIS:  1. Internal  and external hemorrhoids.  2. Otherwise, within normal limits to the terminal ileum status post random     biopsies throughout.   PLAN:  Wait pathology.  Happy to see back p.r.n.  Yearly rectals and guaiacs  per Dr. Jonny Ruiz.  OK to stop the Pentasa for now, restart p.r.n. and if  no further problem, repeat screening at age 48.                                               Jeryl Columbia, M.D.    MEM/MEDQ  D:  11/10/2002  T:  11/10/2002  Job:  [id number]

## 2010-06-15 NOTE — Op Note (Signed)
NAME:  Vanessa Mills, Vanessa Mills                       ACCOUNT NO.:  [id number]   MEDICAL RECORD NO.:  [id number]                   PATIENT TYPE:  OBV   LOCATION:  0361                                 FACILITY:  [facility]   PHYSICIAN:  Diona Foley, M.D.                DATE OF BIRTH:  1960-07-24   DATE OF PROCEDURE:  08/16/2003  DATE OF DISCHARGE:                                 OPERATIVE REPORT   This is a redictation.  Previous dictation was discontinued.   PREOPERATIVE DIAGNOSIS:  Desires permanent sterilization.   POSTOPERATIVE DIAGNOSIS:  Desires permanent sterilization.   PROCEDURE:  Laparoscopic tubal sterilization.   SURGEON:  Diona Foley, M.D.   ANESTHESIA:  General endotracheal.   COMPLICATIONS:  None.   ESTIMATED BLOOD LOSS:  Minimal.   FINDINGS:  Normal-appearing ovaries bilaterally, normal fallopian tubes, 1  cm simple-appearing paratubal cyst on the right fallopian tube, a 2-3 cm  exophytic fundal fibroid.  Otherwise, normal uterus.   INDICATIONS:  This is a 50 year old, gravida 1, para 1, white female, who  desires permanent sterilization.  The patient has been counseled on  alternative methods of contraception including oral contraceptive pills,  injectable contraception, barrier method, and intrauterine device.  The  patient has reviewed all of her options and desires to proceed with  permanent sterilization, as she has desire for no future childbearing.  Prior to the surgery, the risks of the procedure were reviewed with the  patient, and informed consent was obtained.  Specifically, we discussed the  risks of hemorrhage requiring transfusion, infection prolonging her  hospitalization and possibly causing intra-abdominal scarring which could  cause chronic pain in the future, injury to the bowel, the bladder, the  ureter, or other intra-abdominal organs which would require additional  surgery and could prolong her hospitalization and result in the need for  further surgeries in the future and __________ complications and even death.  The patient voiced understanding of all these risks and agreed to proceed.  Consent was obtained, and all questions were answered before proceeding to  the OR.   DESCRIPTION OF PROCEDURE:  The patient was already in the operating room for  a concomitant suburethral sling placement by Dr. Reece Agar.  The patient was  already in the dorsal lithotomy position.  After Dr. Reece Agar finished his  procedure, the abdomen and vagina were then re-prepped with Hibiclens.  A  Foley catheter was already in place, and bimanual exam was performed which  confirmed the presence of a retroflexed uterus with normal adnexa, no  obvious pelvic masses.  The uterus was approximately 6 weeks size.  The  speculum was then placed in the vagina, and the cervix was grasped with a  single-tooth tenaculum.  The Hulka tenaculum was then introduced into the  cervix to use for manipulation of the uterus throughout the remaining  portion of the procedure.  The single-tooth tenaculum and speculum were then  removed  from the vagina, and attention was then turned to the patient's  abdomen after sterile gown and glove were donned.  At this point, 5 mL of  0.25% plain Marcaine were injected in the infraumbilical area.  A 10 mm skin  incision was made with a scalpel.  This was carried down sharply to the  fascia.  The fascia was then grasped between two Kocher clamps, elevated,  and the fascia was then entered sharply with the scalpel.  This incision was  then extended such that a finger could be introduced in the incision.  The  finger was then swept beneath the fascia, and there was no evidence of any  intra-abdominal adhesions.  The fascia was then secured with a pursestring  suture 0 Vicryl suture.  The Hasson trocar and sleeve were then introduced,  and the operative hysteroscope was introduced to confirm intra-abdominal  placement.  CO2 gas was  then allowed to insufflate.  Careful inspection of  the omentum of the bowel revealed that there was no evidence of any injury  to the bowel or omentum during entry into the abdomen.  At this point, the  pelvis was then inspected with the findings noted above.  Given the  patient's retroflexed uterus, it was very difficult to visualize the ovaries  simply with the use of the port on the operative hysteroscope.  Therefore, a  second 5 mm skin incision was then made with the scalpel.  This was made in  the patient's left lower quadrant in an area that was free from the area of  the inferior epigastric vessels.  Then 2 mL of 0.25% Marcaine plain were  injected in the skin prior to making the skin incision.  The 5 mm trocar and  sleeve were then introduced under direct visualization.  An atraumatic  grasper was then introduced through this 5 mm port, and the pelvis was  inspected with the findings noted above.  The appendix was inspected and  appeared normal.  The gallbladder and liver were inspected and appeared  normal as well.  At this point, the fallopian tubes were identified.  There  was a small paratubal cyst coming from the distal end of the fallopian tube.  This was cauterized at its base with bipolar cautery and was removed through  the 5 mm port intact.  The uterus was inspected, and there was noted to be  an approximately 2-3 cm exophytic fibroid at the fundus.  This appeared  benign, consistent with a uterine leiomyoma.  Given that the patient had not  had any complaints of pelvic pain or irregular menses, the fibroid was left  intact.  At this point, the right fallopian tube was then grasped with the  Kleppingers, and a 3 cm of the mid section of the tube was then cauterized  with bipolar cautery, and good blanching was noted.  There was no evidence  of any compromise of the infundibulopelvic ligament, and the cauterization occurred in an area that was well-distanced from the  bowel.  During the  cauterization, there was an exudative material that expelled from the end of  the fallopian tube.  This was easily removed using an atraumatic grasper  through the 5 mm port, and this was sent to pathology for evaluation.  Both  fallopian tubes, with the exception of the small paratubal cyst, were  normal.  There was no evidence of any dilation of the tubes, endometriosis,  or clubbed fimbria suggestive of a chronic PID,  and the patient had no prior  history of sexually transmitted infections.  After cautery of the right  fallopian tube was complete, attention was then turned to the left side  where the left fallopian tube was cauterized in similar manner, taking a 3  cm mid section portion of the tube and cauterizing until adequate blanching  was achieved.  Again, there was no evidence of any injury to the  infundibulopelvic ligament, and cautery was performed clearly away from the  underlying bowel.  At this point, the Nezhat was then introduced into the  pelvis, and the pelvis was thoroughly irrigated.  The fallopian tubes and  ovaries were inspected and appeared normal, and there was no bleeding coming  from either fallopian tube.  At this point, the instruments were removed  from the patient's abdomen.  The 5 mm incision site was carefully inspected,  as the 5 mm port was removed and was hemostatic.  CO2 gas was then allowed  to escape, and the Hasson trocar port was removed from the umbilical  incision.  A finger was then placed into the fascial incision and swept  beneath the fascia to ensure there was no bowel, omentum looped into the  fascial suture, and the fascial stitch was tied, and the fascia was  adequately closed.  The 10 mm umbilical incision was closed with a 4-0  Vicryl suture in subcutaneous fashion, and the left lower quadrant port was  closed with Dermabond.  At this point, attention was then turned to the  patient's vagina where the speculum was  reintroduced.  The Hulka tenaculum  was removed.  There was no bleeding noted from the tenaculum site.  Very  minimal bleeding was coming from the cervical os.  Dr. Reece Agar then  proceeded to pack the vagina to finish his portion of the case.   The patient was then taken out of the dorsal lithotomy position and was  awakened from general anesthesia, was transferred to the recovery room awake  and in stable condition.  All sponge, lap, needle, and instrument counts  were correct x 2.  There were no complications.  Given the exudative  material that was seen coming from the fallopian tube during the cautery  process, the patient will be continued on antibiotics postoperatively.                                               Diona Foley, M.D.    JW/MEDQ  D:  08/18/2003  T:  08/18/2003  Job:  [id number]

## 2010-06-15 NOTE — H&P (Signed)
NAME:  Vanessa Mills, Vanessa Mills                       ACCOUNT NO.:  [id number]   MEDICAL RECORD NO.:  [id number]                   PATIENT TYPE:  INP   LOCATION:  NA                                   FACILITY:  [facility]   PHYSICIAN:  Selinda Orion, M.D.               DATE OF BIRTH:  [birth year]   DATE OF ADMISSION:  DATE OF DISCHARGE:                                HISTORY & PHYSICAL   CHIEF COMPLAINT:  Stress urinary incontinence.  Heavy periods.   HISTORY OF PRESENT ILLNESS:  Vanessa Mills is a 50 year old nulligravid female  with stress incontinence who has been evaluated by Dr. Reece Agar who feels  that a sling procedure is indicated.  She also has profuse periods and is  agreeable to a vaginal hysterectomy in conjunction with this pelvic  reconstruction.  Her Pap smears are normal.   She has two comorbidities, she is on Lisinopril for hypertension, and she  has a history of Crohn's disease and multiple colonoscopies.  She has taken  no herbal products in the last two weeks.   ALLERGIES:  No known drug allergies.   The last menstrual period was about two weeks ago.   REVIEW OF SYSTEMS:  HEENT:  She has no headache, she wears glasses, but has  noted no decrease in visual or auditory acuity.  CARDIOVASCULAR:  She has  been treated for hypertension, this is well controlled.  She denies chest  pain, no shortness of breath, she has no history of mitral valve prolapse or  rheumatic fever.  PULMONARY:  She has multiple allergies.  She takes Zyrtec  and Flonase.  No asthma.  NEUROLOGIC:  She has no headaches, no history of  _____________neurologic disorder, no dizziness.  GENITOURINARY:  She has  stress urinary incontinence, but very little urge.   FAMILY HISTORY:  She has multiple aunts on her mother's side with breast  cancer.  She has no colon cancer, no ovarian cancer, no heart disease.  No  diabetes or osteoporosis.  Her mother has melanoma.  Her mother is 63 and  living.  Her father is 41  and living.  She has two sisters who are in good  health.  Her father has mild hypertension.   PHYSICAL EXAMINATION:  GENERAL:  A well-developed, well-nourished female who  appears to be her stated age.  She has a slight head tremor.  VITAL SIGNS:  Weight 156, blood pressure 118/70.  HEENT:  Unremarkable.  Oropharynx is not injected.  NECK:  Supple, carotid pulses are equal without bruits.  Thyroid is not  enlarged.  LUNGS:  Clear to P&A.  BREASTS:  No masses or tenderness.  She has a breast cyst in the right  breast which has been proven by ultrasound and mammogram.  This measures  about 2 x3 cm.  Axilla negative.  LUNGS:  Clear.  HEART:  Normal sinus rhythm.  No murmurs.  ABDOMEN:  Soft  on plane, no masses felt, no tenderness.  Liver, spleen, and  kidneys are not enlarged.  No bruits heard.  PELVIC:  A fairly well supported urethra.  There is a second degree  cystocele.  The cervix is clean.  The uterus is retroverted, normal size and  shape.  Adnexa negative.  Rectovaginal confirms.   IMPRESSION:  1. Stress urinary incontinence.  2. Profuse periods.   PLAN:  Vaginal hysterectomy with sling, and possible anterior repair.  I  doubt posterior repair.  Detailed informed consent given to the patient, and  she understands the risks of the procedure, including infection, hemorrhage,  damage to bladder and bowel.                                               Selinda Orion, M.D.    SDM/MEDQ  D:  05/20/2003  T:  05/20/2003  Job:  [id number]

## 2010-06-15 NOTE — Op Note (Signed)
NAME:  Vanessa Mills, Vanessa Mills             ACCOUNT NO.:  [id number]   MEDICAL RECORD NO.:  [id number]          PATIENT TYPE:  AMB   LOCATION:  [place]                           FACILITY:  [facility]   PHYSICIAN:  [place] B. Rosana Hoes, M.D.  DATE OF BIRTH:  [birth year]   DATE OF PROCEDURE:  11/27/2004  DATE OF DISCHARGE:                                 OPERATIVE REPORT   PREOPERATIVE DIAGNOSES:  1.  Abnormal bleeding.  2.  Uterine fibroids.   POSTOPERATIVE DIAGNOSES:  1.  Abnormal bleeding.  2.  Uterine fibroids.   PROCEDURE:  Laparoscopic supracervical hysterectomy.   SURGEON:  Blair Dolphin. Rosana Hoes, M.D.   ASSISTANT:  Diona Foley, M.D.   ANESTHESIA:  General.   SPECIMENS:  Uterus and upper cervix.   ESTIMATED BLOOD LOSS:  100 mL.   COMPLICATIONS:  None.   INDICATIONS:  Patient with a history of heavy menstrual bleeding and  associated dysmenorrhea.  Ultrasound consistent with uterine fibroids.  The  patient desires minimally-invasive therapy and prefers supracervical  hysterectomy to minimize time out of work.  She has no history of abnormal  Paps.  She is aware that she will need continued cytology yearly.  Also,  preop endometrial biopsy was benign.   PROCEDURE:  The patient was taken to the operating room and general  anesthesia obtained.  She was placed in the [place], prepped and  draped in standard fashion, Foley catheter inserted into the bladder.   A 10 mm incision placed in the umbilicus and carried sharply to the fascia.  The fascia was elevated and incised.  The posterior sheath of the peritoneum  divided sharply, pursestring suture of 0 Vicryl placed around the fascial  defect.  The Hasson cannula inserted and secured.  Pneumoperitoneum obtained  with CO2.   An 11 mm port placed in the left lower quadrant, a 5 mm in the right lower  quadrant under direct laparoscopic visualization.   There were some filmy adhesions of the sigmoid colon to the left pelvic  sidewall.   Those were taken down sharply.  The course of each ureter was  identified and found to be well away from the area of interest.   The left round ligament was placed on traction, sealed and divided with a  Harmonic.  The uterine-ovarian pedicle divided in similar fashion.  Anterior  leaf of the broad ligament developed with the Harmonic, posterior leaf  developed in similar fashion.  Bladder flap was advanced.  Uterine artery  and vein were skeletonized on the left.  Each was individually clamped with  the Harmonic, sealed and divided.  The entire procedure repeated on the  right side in the exact same manner.   The cervix was truncated in a reverse cone fashion with the Harmonic.  There  was one bleeder on the right uterine artery.  This was made hemostatic with  the bipolar cautery.  Hemostasis obtained.   The uterus was morcellated in standard fashion, all debris was removed.  Pressure was taken down and lines of dissection inspected.  They were  hemostatic.  The cervical canal was cauterized  with the Harmonic on max  setting for 20 seconds and Interceed placed over the cervical stump.   The left lower quadrant trocar site fascia was reapproximated with a figure-  of-eight stitch of 0 Vicryl with the abdomen insufflated under direct  laparoscopic supervision.   The scope was removed, gas released and Hasson cannula removed.  The  abdominal wall was elevated with an [employer], pursestring suture was  snugged down.  This obliterated the fascial defect.  No intra-abdominal  contents herniated through prior to closure.  The skin was closed at the  lower site with Dermabond and at the umbilicus with 4-0 Vicryl.   The patient tolerated the procedure well.  There were no complications.  She  was taken to the recovery room awake, alert and in stable condition.  All  counts were correct per the operating room staff.      [place] B. Rosana Hoes, M.D.  Electronically Signed     WBD/MEDQ  D:   11/27/2004  T:  11/27/2004  Job:  [id number]

## 2010-06-15 NOTE — Op Note (Signed)
NAME:  TIEA, MANNINEN NO.:  [id number]   MEDICAL RECORD NO.:  [id number]                   PATIENT TYPE:  EMS   LOCATION:  [place]                                 FACILITY:  [facility]   PHYSICIAN:  Jeryl Columbia, M.D.                 DATE OF BIRTH:  11/26/1960   DATE OF PROCEDURE:  09/26/2003  DATE OF DISCHARGE:                                 OPERATIVE REPORT   PROCEDURE:  Colonoscopy.   INDICATIONS FOR PROCEDURE:  Diarrhea, history of Crohn's.  Consent was  signed after risks, benefits, methods, and options were thoroughly discussed  multiple times in the past.   MEDICATIONS USED:  Demerol 100, Versed 10.   PROCEDURE:  Rectal inspection was pertinent for external hemorrhoids.  Digital exam was negative.  The video pediatric adjustable colonoscope was  inserted and easily advanced around the colon to the cecum.  This did  require some abdominal pressure but no position changes.  On insertion, it  was an unprepped exam, so formed stool was seen, but no liquid diarrhea.  The underlying mucosa that was seen was normal.  The cecum was identified by  the appendiceal orifice and the ileocecal valve.  The scope was inserted a  short ways into the terminal ileum which was normal.  Photodocumentation and  scattered biopsies were obtained and put in the first container.  We did  wash and suction some of the stool and some of it was sent for the usual  study.  The scope was slowly withdrawn.  The right side had more stool that  the left and certainly some parts of the wall could not be seen due to  stool.  Random colon biopsies were obtained as we slowly withdrew back to  the rectum, but no signs of Crohn's colitis or other abnormalities were seen  as we slowly withdrew back to the rectum.  The random colon biopsies were  obtained and put in the second container.  Anorectal pull through and  retroflexion confirmed some small hemorrhoids.  The scope was reinserted a  short ways up the left side of the colon, air was suctioned, the scope was  removed.  The patient tolerated the procedure well.  There was no obvious  immediate complications.   ENDOSCOPIC DIAGNOSIS:  1. Small internal and external hemorrhoids.  2. Essentially normal exam unprepped to the terminal ileum, status post     random biopsies throughout as well as stool studies being collected.   PLAN:  Continue Flagyl for now, await pathology, probably small bowel series  next, await stool studies and path.  Call me p.r.n.  Otherwise check on her  at the end of this week when we review studies.  Jeryl Columbia, M.D.    MEM/MEDQ  D:  09/26/2003  T:  09/26/2003  Job:  [id number]

## 2010-06-15 NOTE — Op Note (Signed)
NAME:  Vanessa Mills, Vanessa Mills             ACCOUNT NO.:  [id number]   MEDICAL RECORD NO.:  [id number]          PATIENT TYPE:  AMB   LOCATION:  [place]                          FACILITY:  [facility]   PHYSICIAN:  Rudell Cobb. Annamaria Boots, M.D.   DATE OF BIRTH:  [birth year]   DATE OF PROCEDURE:  12/15/2003  DATE OF DISCHARGE:                                 OPERATIVE REPORT   PREOPERATIVE DIAGNOSIS:  Recurrent cyst of the right breast.   POSTOPERATIVE DIAGNOSIS:  Recurrent cyst of the right breast.   PROCEDURE:  Excision of recurrent cyst of the right breast.   SURGEON:  Rudell Cobb. Annamaria Boots, M.D.   ANESTHESIA:  MAC.   DESCRIPTION OF PROCEDURE:  The patient was placed on the operating table  with the arms extended on the arm board.  The right breast was prepped and  draped in the usual sterile fashion.  A curved circumareolar incision  centered at the 12 o'clock position was outlined with a marking pencil which  was directly over the recurrent cyst.  Area was then infiltrated with a  local anesthetic mixture.   The incision was made and the cyst was excised.  We did enter the cyst in  part, so there is probably an incomplete excision of the cyst wall and I  cauterized it.  There was clearly an artery leading into the cyst which I  think explains the bloody fluid.  With good hemostasis, I then closed with  an interrupted subcuticular 4-0 Monocryls and Steri-Strips.  Dressings were  applied.  The patient transferred to the recovery room in satisfactory  condition having tolerated the procedure well.      [name]   PRY/MEDQ  D:  12/15/2003  T:  12/15/2003  Job:  [id number]

## 2010-06-28 ENCOUNTER — Observation Stay (HOSPITAL_COMMUNITY)
Admission: RE | Admit: 2010-06-28 | Discharge: 2010-06-29 | Disposition: A | Discharge status: Home / Self Care | Payer: Medicare Other | Source: Ambulatory Visit | Attending: Otolaryngology | Admitting: Otolaryngology

## 2010-06-28 ENCOUNTER — Ambulatory Visit (HOSPITAL_COMMUNITY): Payer: Medicare Other

## 2010-06-28 DIAGNOSIS — I10 Essential (primary) hypertension: Secondary | ICD-10-CM | POA: Insufficient documentation

## 2010-06-28 DIAGNOSIS — Z01810 Encounter for preprocedural cardiovascular examination: Secondary | ICD-10-CM | POA: Insufficient documentation

## 2010-06-28 DIAGNOSIS — Z01811 Encounter for preprocedural respiratory examination: Secondary | ICD-10-CM | POA: Insufficient documentation

## 2010-06-28 DIAGNOSIS — Z01812 Encounter for preprocedural laboratory examination: Secondary | ICD-10-CM | POA: Insufficient documentation

## 2010-06-28 DIAGNOSIS — H532 Diplopia: Secondary | ICD-10-CM | POA: Insufficient documentation

## 2010-06-28 DIAGNOSIS — W1789XA Other fall from one level to another, initial encounter: Secondary | ICD-10-CM | POA: Insufficient documentation

## 2010-06-28 DIAGNOSIS — Y92009 Unspecified place in unspecified non-institutional (private) residence as the place of occurrence of the external cause: Secondary | ICD-10-CM | POA: Insufficient documentation

## 2010-06-28 DIAGNOSIS — F319 Bipolar disorder, unspecified: Secondary | ICD-10-CM | POA: Insufficient documentation

## 2010-06-28 DIAGNOSIS — K5090 Crohn's disease, unspecified, without complications: Secondary | ICD-10-CM | POA: Insufficient documentation

## 2010-06-28 DIAGNOSIS — S0230XA Fracture of orbital floor, unspecified side, initial encounter for closed fracture: Principal | ICD-10-CM | POA: Insufficient documentation

## 2010-06-28 LAB — BASIC METABOLIC PANEL
Calcium: 9 mg/dL (ref 8.4–10.5)
Creatinine, Ser: 0.81 mg/dL (ref 0.4–1.2)
GFR calc Af Amer: >60 mL/min (ref >60)
GFR calc non Af Amer: >60 mL/min (ref >60)
Sodium: 139 mEq/L (ref 135–145)

## 2010-06-28 LAB — CBC
MCH: 32 pg (ref 26.0–34.0)
MCHC: 34.4 g/dL (ref 30.0–36.0)
Platelets: 233 10*3/uL (ref 150–400)
RDW: 13.4 % (ref 11.5–15.5)

## 2010-06-28 LAB — SURGICAL PCR SCREEN
MRSA, PCR: NEGATIVE
Staphylococcus aureus: NEGATIVE

## 2010-07-12 NOTE — H&P (Signed)
NAME:  Vanessa Mills, Vanessa Mills             ACCOUNT NO.:  [id number]  MEDICAL RECORD NO.:  [id number]           PATIENT TYPE:  I  LOCATION:  0307                          FACILITY:  [facility]  PHYSICIAN:  Norm Salt, MD  DATE OF BIRTH:  1960-09-14  DATE OF ADMISSION:  05/19/2010 DATE OF DISCHARGE:                      PSYCHIATRIC ADMISSION ASSESSMENT   HISTORY OF PRESENT ILLNESS:  This is a voluntary admission to the services of Dr. Waymon Amato.  This is a 50 year old married white female.  He brought her to [facility].  He reported that she had been wondering.  She had been agitated and confused.  He had found some evidence to suggest she had jumped out of a second story window and was found by the police with a packed suitcase. She had been seen by Dr. Robina Ade twice earlier in the week.  In the emergency room her alcohol level was 69, her glucose was elevated at 120 and her UDS was positive for benzodiazepines.  She was actually seen at [facility] on 04/19 after jumping from a second story window because she thought she was jumping into the arms of [facility].  She was slow to respond. She appeared to be responding to internal stimuli.  She did reports she was hearing voices and does not like what they are saying to her. They are telling her what to do.  Her spouse reports she has been wandering, hearing deceptive voices telling her to go to the airport.  She has been thinking about going to heaven and how glorious it will be.  She was seen at the [facility] on 04/19, they went home.  She appeared here at the [facility] on the 20th, but was unwilling to be admitted, came back on the 21st and said she was now ready to be admitted and to have her meds adjusted.  PAST PSYCHIATRIC HISTORY:  She was with [place] on the 17th being discharged on the 18th.  Apparently, she was experiencing psychotic symptoms at that time after she had stopped taking her medication.  She  was attempting to decrease Saphris at that time and her symptoms returned. Her past psychiatric history is that she has had at least 5 or 6 hospitalizations secondary to psychiatric symptoms.  She sees Dr. Robina Ade on an outpatient basis and as she had an appointment with her outpatient psychiatrist the following day and requested discharge, she was allowed to be discharged on April 18.  PAST MEDICAL HISTORY:  She is positive for migraine headaches, Crohn's disease.  CURRENT MEDICATIONS:  She is supposed to be taking propranolol 60 mg p.o. b.i.d., Klonopin 0.5 mg p.o. b.i.d., Saphris sublingual 10 mg daily, Maxzide/ Dyazide 37.5/25 one tablet p.o. daily, and Depakote 1500 mg p.o. at bedtime.  ALLERGIES:  She has no known drug allergies disease.  FAMILY HISTORY:  Her father died of congestive heart failure at age 61. Her mother is alive at 44 years of age; she has arthritis and diabetes. A sister 76 who has bipolar, as well as abuses alcohol and a second sister 86 years old who also sees a Teacher, music.  The  patient has one child 85 years of age in good health.  SOCIAL HISTORY:  She was born in [place], raised in [place], and currently lives in [place] since 1978.  She her husband live at home. She denies any tobacco products, reports very rare use of alcohol although she was using alcohol today, and she denies any use of illicit drugs.  MENTAL STATUS EXAM:  She was seen in conjunction with Dr. Adele Schilder. She was noted to have poor eye contact, her speech was slow and soft.  Her thought process was tangential.  She did exhibit thought-blocking.  She acknowledged auditory hallucinations.  She was still suicidal and delusional and she was alert and oriented.  Insight and judgment were poor.  DIAGNOSIS:  AXIS I:  Schizoaffective disorder versus bipolar. AXIS II:  None known. AXIS III:  Hypertension. AXIS IV:  Chronic mental illness, noncompliance with medications. AXIS V:   Approximately 25.  PLAN:  The plan is to admit for safety and stabilization.  Her medications will be adjusted as indicated and we will collaborate with her outpatient psychiatrist, Dr. Robina Ade.     Mickie Kerry Dory, P.A.-C.   ______________________________ Norm Salt, MD    MD/MEDQ  D:  05/20/2010  T:  05/20/2010  Job:  [id number]  Electronically Signed by Emiliano Dyer ADAMS P.A.-C. on 06/18/2010 08:07:19 PM Electronically Signed by Hampton Abbot MD on 07/12/2010 06:14:34 AM

## 2010-08-20 ENCOUNTER — Ambulatory Visit: Payer: Medicare Other | Admitting: Physical Therapy

## 2010-08-23 NOTE — Op Note (Signed)
NAME:  Vanessa Mills, Vanessa Mills             ACCOUNT NO.:  [id number]  MEDICAL RECORD NO.:  [id number]           PATIENT TYPE:  O  LOCATION:  7026                         FACILITY:  [facility]  PHYSICIAN:  Melissa Montane, M.D.       DATE OF BIRTH:  1960-04-04  DATE OF PROCEDURE:  06/28/2010 DATE OF DISCHARGE:                              OPERATIVE REPORT   PREOPERATIVE DIAGNOSIS:  Right floor of orbit fracture.  POSTOPERATIVE DIAGNOSIS:  Right floor of orbit fracture.  SURGICAL PROCEDURES:  Open reduction and internal fixation of right orbital floor fracture.  ANESTHESIA:  General.  ESTIMATED BLOOD LOSS:  Less than 10 mL.  INDICATIONS:  A 50 year old who approximately 4-5 weeks ago fell out of a window and hit her face and sustained a significant orbital fracture that had a large defect and lot of orbital fat into the maxillary sinus. She is having slight amount of diplopia and numbness over the right cheek.  She now has recovered from her psychiatric break and is able to consent for the procedure.  She was informed of the risk and benefits of the procedure and options were discussed.  All questions were answered and consent was obtained.  OPERATION:  The patient was taken to the operating room, placed in supine position.  After general endotracheal tube anesthesia, she was prepped and draped in the usual sterile manner and injected with 1% lidocaine with 1;100,000 epinephrine along the lateral orbit and inferior orbit and incision was made just lateral to the lateral canthus, dissected down to the bone and then the lateral canthus was divided with scissor dissection.  Careful dissection along the inferior orbital rim to preserve the periorbita and the conjunctiva was cut as the dissection was carried along the inferior border dissecting the lower lid downward and exposing the orbital rim.  Once this was reformed, the periostium was divided and the orbit was entered.  There was a large  amount of fat that was extruded down into the maxillary sinus and was dissected to the point of finding the bone that was trapped toward into the maxillary sinus.  It was freed up and brought back into its anatomic position.  The orbital nerve was dissected to make sure it stayed inferior so there was an area to place the plate that would suspend from solid orbital floor on the right and left which was dissected and exposed.  Once all of this was freed up, a 10 plate was placed into the orbital floor and then a Medpor mesh material was placed into the floor, contoured to the 10 plate, fit very nicely under the orbital rim, supported all the contents.  The inferior mucosa of the maxillary sinus could be seen and was left inferior in the maxillary sinus.  The orbital nerve was intact.  The periosteum was then closed over the edge of the orbital rim and the lateral canthus was reattached using a 6-0 nylon reapproximating its perfect anatomic position and then the subcu incision was closed with interrupted 4-0 chromic and a 5-0 nylon interrupted to close the incision.  BSS was used to irrigate the eye and  a corneal shield had been placed in the beginning of the case and then was removed at this point.  She was awakened and brought to recovery room in stable condition.  Counts correct.          ______________________________ Melissa Montane, M.D.     JB/MEDQ  D:  06/28/2010  T:  06/29/2010  Job:  [id number]  Electronically Signed by Melissa Montane M.D. on 08/23/2010 09:16:16 AM

## 2010-08-30 NOTE — Discharge Summary (Signed)
  NAMEMarland Mills  SHUNTA, MCLAURIN NO.:  [id number]  MEDICAL RECORD NO.:  [id number]  LOCATION:  2549                         FACILITY:  [facility]  PHYSICIAN:  Melissa Montane, M.D.       DATE OF BIRTH:  January 11, 1961  DATE OF ADMISSION:  06/28/2010 DATE OF DISCHARGE:  06/29/2010                              DISCHARGE SUMMARY   ADMISSION DIAGNOSIS:  Right orbital floor fracture.  DISCHARGE DIAGNOSIS:  Right orbital floor fracture.  SURGICAL PROCEDURES:  Open reduction and internal fixation of right orbital floor fracture.  HOSPITAL COURSE:  The patient was here for a repair of a large orbital floor fracture that was operated on on Jun 26, 2010, and postoperatively on the floor, she was doing well, did complain of double vision and pain.  She had no significant swelling or issues with the eye itself and the extraocular motor muscles seemed to be moving well, and her vision was grossly intact.  On postop day 2, she was doing very well, washappy.  There was no bleeding.  Her vision was excellent, and she said that the movement of her eye was good.  There was minimal swelling.  No issues with the conjunctiva, swelling, or any chemosis.  The pain was controlled.  She was discharged to home to follow up in 1 week, use her medications as instructed as outlined in the medication list and call if there are any issues or problems that arise, and these were discussed.          ______________________________ Melissa Montane, M.D.     JB/MEDQ  D:  08/23/2010  T:  08/23/2010  Job:  [id number]  Electronically Signed by Melissa Montane M.D. on 08/30/2010 83:09:40 AM

## 2010-10-02 ENCOUNTER — Other Ambulatory Visit: Payer: Self-pay | Admitting: Radiology

## 2010-10-03 ENCOUNTER — Other Ambulatory Visit: Payer: Self-pay | Admitting: Radiology

## 2010-10-03 DIAGNOSIS — C50912 Malignant neoplasm of unspecified site of left female breast: Secondary | ICD-10-CM

## 2010-10-08 ENCOUNTER — Ambulatory Visit
Admission: RE | Admit: 2010-10-08 | Discharge: 2010-10-08 | Disposition: A | Discharge status: Home / Self Care | Payer: Medicare Other | Source: Ambulatory Visit | Attending: Radiology | Admitting: Radiology

## 2010-10-08 DIAGNOSIS — C50912 Malignant neoplasm of unspecified site of left female breast: Secondary | ICD-10-CM

## 2010-10-08 MED ORDER — GADOBENATE DIMEGLUMINE 529 MG/ML IV SOLN
13.0000 mL | Freq: Once | INTRAVENOUS | Status: AC | PRN
  Administered 2010-10-08: 13 mL via INTRAVENOUS

## 2010-10-10 ENCOUNTER — Other Ambulatory Visit: Payer: Self-pay | Admitting: Oncology

## 2010-10-10 ENCOUNTER — Encounter (INDEPENDENT_AMBULATORY_CARE_PROVIDER_SITE_OTHER): Payer: Self-pay | Admitting: Surgery

## 2010-10-10 ENCOUNTER — Ambulatory Visit (HOSPITAL_BASED_OUTPATIENT_CLINIC_OR_DEPARTMENT_OTHER): Payer: Medicare Other | Admitting: Surgery

## 2010-10-10 ENCOUNTER — Encounter (HOSPITAL_BASED_OUTPATIENT_CLINIC_OR_DEPARTMENT_OTHER): Payer: Medicare Other | Admitting: Oncology

## 2010-10-10 VITALS — BP 117/75 | HR 48 | Temp 98.2°F | Resp 20 | Ht 68.0 in | Wt 147.3 lb

## 2010-10-10 DIAGNOSIS — C50419 Malignant neoplasm of upper-outer quadrant of unspecified female breast: Secondary | ICD-10-CM

## 2010-10-10 HISTORY — DX: Malignant neoplasm of upper-outer quadrant of unspecified female breast: C50.419

## 2010-10-10 LAB — COMPREHENSIVE METABOLIC PANEL
ALT: 15 U/L (ref 0–35)
AST: 18 U/L (ref 0–37)
Albumin: 3.9 g/dL (ref 3.5–5.2)
Alkaline Phosphatase: 55 U/L (ref 39–117)
Calcium: 9.6 mg/dL (ref 8.4–10.5)
Chloride: 101 mEq/L (ref 96–112)
Creatinine, Ser: 0.73 mg/dL (ref 0.50–1.10)
Potassium: 4.2 mEq/L (ref 3.5–5.3)

## 2010-10-10 LAB — CBC WITH DIFFERENTIAL/PLATELET
BASO%: 0.4 % (ref 0.0–2.0)
EOS%: 1 % (ref 0.0–7.0)
MCH: 32 pg (ref 25.1–34.0)
MCHC: 33.8 g/dL (ref 31.5–36.0)
MONO%: 6.6 % (ref 0.0–14.0)
RDW: 12.6 % (ref 11.2–14.5)
lymph#: 1.7 10*3/uL (ref 0.9–3.3)

## 2010-10-10 NOTE — Progress Notes (Signed)
Chief Complaint  Patient presents with  . Breast Cancer    HPI Vanessa Mills is a 50 y.o. female.  She found an area that she was worried about in the left breast a few weeks ago. She also noted some nipple inversion on left about 3 weeks ago. 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 MRI has shown a fairly large area of abnormality. There is no evidence of an axillary metastasis. She comes to the breast multidisciplinary clinic for evaluation. Her primary physician is Dr.Westerman.[name] HPI  Past Medical History  Diagnosis Date  . Chronic mental illness   . Raynaud's disease   . Meniere's syndrome   . Crohn's disease     Past Surgical History  Procedure Date  . Abdominal hysterectomy   . Bladder tuck   . 2 orbital fracture surgeries   . Sinus exploration     No family history on file.  Social History History  Substance Use Topics  . Smoking status: Never Smoker   . Smokeless tobacco: Not on file  . Alcohol Use: 0.5 oz/week    1 drink(s) per week    No Known Allergies  Current Outpatient Prescriptions  Medication Sig Dispense Refill  . ALPRAZolam (XANAX XR) 1 MG 24 hr tablet Take 1 mg by mouth every morning.        [name] ALPRAZolam (XANAX) 1 MG tablet Take 1 mg by mouth daily as needed.        [name] asenapine (SAPHRIS) 5 MG SUBL Place 10 mg under the tongue.        . Multiple Vitamin (MULTIVITAMIN) capsule Take 1 capsule by mouth daily.        . propranolol (INDERAL) 60 MG tablet Take 60 mg by mouth 2 (two) times daily.          Review of Systems Review of Systems I have gone over her past history and review of systems. Is positive for history of Crohn's disease, anxiety, migraines, Mnire's disease. Therefore the details in other sections of epic.  Blood pressure 117/75, pulse 48, temperature 98.2 F (36.8 C), resp. rate 20, height 5' 8"   (1.727 m), weight 147 lb 4.8 oz (66.815 kg).  Physical Exam Physical Exam GENERAL: The patient is alert, oriented, and generally healthy-appearing, NAD. Mood and affect are normal.  HEENT: The head is normocephalic, the eyes nonicteric, the pupils were round regular and equal. EOMs are normal. Pharynx normal. Dentition good.  NECK: The neck is supple and there are no masses or thyromegaly.  LUNGS: Normal respirations and clear to auscultation.  HEART: Regular rhythm, with no murmurs rubs or gallops. Pulses are intact carotid dorsalis pedis and posterior tibial. No significant varicosities are noted.  BREASTS: the right breast is normal to both inspection and palpation. The left breast has a large mass involving most of the lateral third of the breast. It extends from the areolar area laterally to the edge of the breast and transversely about 5-6 cm. There is a fairly large ecchymosis so I am not sure how much of this represents tumor but I think most of it does. The left nipple is slightly inverted. There are no skin changes to suggest inflammatory cancer.  LYMPHATICS: There are no axillary or supraclavicular or lymph nodes that feel pathologic although there is a somewhat enlarged from the left axilla but it  is soft.  ABDOMEN: Soft, flat, and nontender. No masses or organomegaly is noted. No hernias are noted. Bowel sounds are normal.  EXTREMITIES: Good range of motion, no edema.   Data Reviewed I have reviewed the mammogram films, ultrasound films, both reports, reviewed and the radiologist, reviewed the MRI scan. I reviewed the reports of the pathology and the slides with the pathologist. I have also discussed her situation with her medical and radiation oncologist.  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 Currently she is not a  candidate for a lumpectomy because the area is too big she is already reviewed this with the medical oncologist and has decided she would prefer to have a mastectomy. Did discuss both options in detail today as well  I then went over the details of a mastectomy including was involved,the need for drains, and need for central lymph node evaluation possible node dissection. Talked about actually at least one day in the hospital if not 2. I think she has a good understanding of all the issues and would like to proceed with scheduling.   I told her I would like to delay a few weeks to let the ecchymosis from the biopsy settle down. She was okay with that and we will try to make arrangements as efficiently as he can.       Tyjuan Demetro J 10/10/2010, 3:37 PM

## 2010-10-10 NOTE — Patient Instructions (Signed)
We will schedule surgery for you in about 3 weeks. We want to allow the bruising to improve.we will plan to do a mastectomy and a sentinel node evaluation. You will need to stay in the hospital overnight.if you have any questions please call my office at [phone] and ask for my nurse, Luvenia Starch

## 2010-10-11 ENCOUNTER — Encounter (INDEPENDENT_AMBULATORY_CARE_PROVIDER_SITE_OTHER): Payer: Self-pay | Admitting: Surgery

## 2010-10-11 ENCOUNTER — Other Ambulatory Visit (INDEPENDENT_AMBULATORY_CARE_PROVIDER_SITE_OTHER): Payer: Self-pay | Admitting: Surgery

## 2010-10-11 DIAGNOSIS — C50912 Malignant neoplasm of unspecified site of left female breast: Secondary | ICD-10-CM

## 2010-10-17 LAB — COMPREHENSIVE METABOLIC PANEL
ALT: 13
AST: 18
Albumin: 4.2
Calcium: 9.2
Creatinine, Ser: 0.86
GFR calc Af Amer: >60
GFR calc non Af Amer: >60
Sodium: 140
Total Protein: 6.9

## 2010-10-17 LAB — URINALYSIS, ROUTINE W REFLEX MICROSCOPIC
Bilirubin Urine: NEGATIVE
Nitrite: NEGATIVE
Protein, ur: NEGATIVE
Urobilinogen, UA: 0.2
pH: 6

## 2010-10-17 LAB — DIFFERENTIAL
Eosinophils Absolute: 0
Eosinophils Relative: 0
Lymphocytes Relative: 20
Lymphs Abs: 1.4
Monocytes Relative: 7

## 2010-10-17 LAB — CBC
MCHC: 34.1
MCV: 92.9
Platelets: 299
RBC: 4.21
RDW: 13.3

## 2010-10-17 LAB — RAPID URINE DRUG SCREEN, HOSP PERFORMED
Amphetamines: NOT DETECTED
Cocaine: NOT DETECTED
Tetrahydrocannabinol: NOT DETECTED

## 2010-10-18 ENCOUNTER — Other Ambulatory Visit (INDEPENDENT_AMBULATORY_CARE_PROVIDER_SITE_OTHER): Payer: Self-pay | Admitting: Surgery

## 2010-10-18 ENCOUNTER — Ambulatory Visit (HOSPITAL_COMMUNITY)
Admission: RE | Admit: 2010-10-18 | Discharge: 2010-10-18 | Disposition: A | Discharge status: Home / Self Care | Payer: Medicare Other | Source: Ambulatory Visit | Attending: Surgery | Admitting: Surgery

## 2010-10-18 ENCOUNTER — Telehealth (INDEPENDENT_AMBULATORY_CARE_PROVIDER_SITE_OTHER): Payer: Self-pay | Admitting: General Surgery

## 2010-10-18 ENCOUNTER — Encounter (HOSPITAL_COMMUNITY)
Admission: RE | Admit: 2010-10-18 | Discharge: 2010-10-18 | Disposition: A | Discharge status: Home / Self Care | Payer: Medicare Other | Source: Ambulatory Visit | Attending: Surgery | Admitting: Surgery

## 2010-10-18 DIAGNOSIS — M4120 Other idiopathic scoliosis, site unspecified: Secondary | ICD-10-CM | POA: Insufficient documentation

## 2010-10-18 DIAGNOSIS — Z01812 Encounter for preprocedural laboratory examination: Secondary | ICD-10-CM | POA: Insufficient documentation

## 2010-10-18 DIAGNOSIS — Z01811 Encounter for preprocedural respiratory examination: Secondary | ICD-10-CM | POA: Insufficient documentation

## 2010-10-18 DIAGNOSIS — C50919 Malignant neoplasm of unspecified site of unspecified female breast: Secondary | ICD-10-CM

## 2010-10-18 DIAGNOSIS — Z01810 Encounter for preprocedural cardiovascular examination: Secondary | ICD-10-CM | POA: Insufficient documentation

## 2010-10-18 LAB — SURGICAL PCR SCREEN
MRSA, PCR: NEGATIVE
Staphylococcus aureus: NEGATIVE

## 2010-10-18 LAB — COMPREHENSIVE METABOLIC PANEL
BUN: 16
CO2: 22
Calcium: 8.4
Chloride: 107
Creatinine, Ser: 0.86
GFR calc non Af Amer: >60
Total Bilirubin: 0.7

## 2010-10-18 LAB — BASIC METABOLIC PANEL
CO2: 31 mEq/L (ref 19–32)
Chloride: 103 mEq/L (ref 96–112)
Creatinine, Ser: 0.84 mg/dL (ref 0.50–1.10)
Glucose, Bld: 73 mg/dL (ref 70–99)
Sodium: 142 mEq/L (ref 135–145)

## 2010-10-18 LAB — CBC
HCT: 42.1 % (ref 36.0–46.0)
HCT: 38.4
MCH: 31.5 pg (ref 26.0–34.0)
MCHC: 34.1
MCV: 92.7 fL (ref 78.0–100.0)
MCV: 92.6
Platelets: 329 10*3/uL (ref 150–400)
RBC: 4.15
RDW: 12.1 % (ref 11.5–15.5)
WBC: 5.8 10*3/uL (ref 4.0–10.5)
WBC: 7.5

## 2010-10-18 LAB — DIFFERENTIAL
Basophils Absolute: 0.1
Eosinophils Relative: 0
Lymphocytes Relative: 4 — ABNORMAL LOW
Neutro Abs: 7
Neutrophils Relative %: 94 — ABNORMAL HIGH

## 2010-10-18 LAB — APTT: aPTT: 30 seconds (ref 24–37)

## 2010-10-18 NOTE — Telephone Encounter (Signed)
Received a call from [place]/ [facility] pre admit- pt refused to sign OP permit because she had questions regarding the procedure and regarding a right mastectomy also.

## 2010-10-19 LAB — HEPATIC FUNCTION PANEL
ALT: 11
AST: 17
Albumin: 4.4
Alkaline Phosphatase: 36 — ABNORMAL LOW
Total Protein: 7.1

## 2010-10-19 LAB — CBC
MCV: 92
Platelets: 263
RBC: 4.5
WBC: 5.8

## 2010-10-19 LAB — URINALYSIS, ROUTINE W REFLEX MICROSCOPIC
Bilirubin Urine: NEGATIVE
Glucose, UA: NEGATIVE
Hgb urine dipstick: NEGATIVE
Ketones, ur: NEGATIVE
Nitrite: NEGATIVE
Specific Gravity, Urine: 1.036 — ABNORMAL HIGH
pH: 6

## 2010-10-19 LAB — COMPREHENSIVE METABOLIC PANEL
ALT: 15
AST: 22
Albumin: 3.9
Calcium: 8.6
Chloride: 102
Creatinine, Ser: 0.73
GFR calc Af Amer: >60
Sodium: 138
Total Bilirubin: 1

## 2010-10-19 LAB — URINE CULTURE
Colony Count: NO GROWTH
Culture: NO GROWTH
Special Requests: POSITIVE

## 2010-10-19 LAB — DIFFERENTIAL
Eosinophils Absolute: 0
Eosinophils Relative: 0
Lymphocytes Relative: 14
Lymphs Abs: 0.8
Monocytes Absolute: 0.5

## 2010-10-19 LAB — VALPROIC ACID LEVEL: Valproic Acid Lvl: <1 — ABNORMAL LOW

## 2010-10-22 NOTE — Telephone Encounter (Signed)
Patient called back, she was questioning getting the right breast removed based on genetic testing results. Patient has not gotten results of genetic testing back yet. Patient will go ahead and have the left mastectomy done tomorrow and think about the right side after we get genetic testing results. Patient still has ovaries from her hysterectomy and if she is BRCA + she will think about removing ovaries and I explained to the patient we could always coordinate a right mastectomy with an oophorectomy. Patient will go ahead with surgery tomorrow.

## 2010-10-23 ENCOUNTER — Ambulatory Visit (HOSPITAL_COMMUNITY)
Admission: RE | Admit: 2010-10-23 | Discharge: 2010-10-25 | Disposition: A | Discharge status: Home / Self Care | Payer: Medicare Other | Source: Ambulatory Visit | Attending: Surgery | Admitting: Surgery

## 2010-10-23 ENCOUNTER — Encounter (INDEPENDENT_AMBULATORY_CARE_PROVIDER_SITE_OTHER): Payer: Self-pay | Admitting: Surgery

## 2010-10-23 ENCOUNTER — Ambulatory Visit (HOSPITAL_COMMUNITY)
Admission: RE | Admit: 2010-10-23 | Discharge: 2010-10-23 | Disposition: A | Discharge status: Home / Self Care | Payer: Medicare Other | Source: Ambulatory Visit | Attending: Surgery | Admitting: Surgery

## 2010-10-23 ENCOUNTER — Other Ambulatory Visit (INDEPENDENT_AMBULATORY_CARE_PROVIDER_SITE_OTHER): Payer: Self-pay | Admitting: Surgery

## 2010-10-23 DIAGNOSIS — Z01812 Encounter for preprocedural laboratory examination: Secondary | ICD-10-CM | POA: Insufficient documentation

## 2010-10-23 DIAGNOSIS — I10 Essential (primary) hypertension: Secondary | ICD-10-CM | POA: Insufficient documentation

## 2010-10-23 DIAGNOSIS — C50912 Malignant neoplasm of unspecified site of left female breast: Secondary | ICD-10-CM

## 2010-10-23 DIAGNOSIS — F319 Bipolar disorder, unspecified: Secondary | ICD-10-CM | POA: Insufficient documentation

## 2010-10-23 DIAGNOSIS — Z01818 Encounter for other preprocedural examination: Secondary | ICD-10-CM | POA: Insufficient documentation

## 2010-10-23 DIAGNOSIS — Z23 Encounter for immunization: Secondary | ICD-10-CM | POA: Insufficient documentation

## 2010-10-23 DIAGNOSIS — G8929 Other chronic pain: Secondary | ICD-10-CM | POA: Insufficient documentation

## 2010-10-23 DIAGNOSIS — Z01810 Encounter for preprocedural cardiovascular examination: Secondary | ICD-10-CM | POA: Insufficient documentation

## 2010-10-23 DIAGNOSIS — C50919 Malignant neoplasm of unspecified site of unspecified female breast: Secondary | ICD-10-CM

## 2010-10-23 DIAGNOSIS — K449 Diaphragmatic hernia without obstruction or gangrene: Secondary | ICD-10-CM | POA: Insufficient documentation

## 2010-10-23 DIAGNOSIS — K5090 Crohn's disease, unspecified, without complications: Secondary | ICD-10-CM | POA: Insufficient documentation

## 2010-10-23 HISTORY — PX: MASTECTOMY MODIFIED RADICAL: SUR848

## 2010-10-23 MED ORDER — TECHNETIUM TC 99M SULFUR COLLOID FILTERED
1.0000 | Freq: Once | INTRAVENOUS | Status: AC | PRN
  Administered 2010-10-23: 1 via INTRADERMAL

## 2010-10-25 NOTE — Op Note (Signed)
NAME:  Vanessa Mills, Vanessa Mills NO.:  [id number]  MEDICAL RECORD NO.:  [id number]  LOCATION:  2505                         FACILITY:  [facility]  PHYSICIAN:  Haywood Lasso, M.D.DATE OF BIRTH:  1960-07-13  DATE OF PROCEDURE:  10/23/2010 DATE OF DISCHARGE:                              OPERATIVE REPORT   PREOPERATIVE DIAGNOSIS:  Carcinoma, left breast lobular clinical stage II.  POSTOPERATIVE DIAGNOSIS:  Carcinoma, left breast lobular clinical stage II.  PROCEDURE:  Left mastectomy with blue dye injection, sentinel lymph node biopsy, and completion, axillary dissection (left modified radical mastectomy).  SURGEON:  Haywood Lasso, MD  ASSISTANT:  Imogene Burn. Tsuei, MD  ANESTHESIA:  General.  CLINICAL HISTORY:  This is a 50 year old lady, recently presented with a fairly bulky lobular carcinoma of the left breast with clinical negative axilla.  After discussion with the patient and presentation at the Tumor Board, she elected to proceed to a total mastectomy with sentinel node evaluation and possible completion of node dissection.  DESCRIPTION OF PROCEDURE:  I saw the patient in the holding area.  She had no further questions.  We confirmed the plans for the procedure and I initialed the left breast.  The patient was taken to the operating room after satisfactory general LMA anesthesia had been obtained.  The left breast was prepped and draped.  I injected 5 mL of dilute methylene blue prior to prepping and draping, but after doing the time-out.  An elliptical incision was outlined and the skin incision made.  A superior flap was raised going to the clavicles, sternum, and then out into the axilla.  I entered the axilla.  I used the Neoprobe and found initially one hot blue lymph node that was taken in a second adjacent one that was hot, but not blue.  Using Neoprobe, I identified two other adjacent nodes, one blue and hot and the other just hot and these  four nodes were sent.  There was no obvious other tumor palpable.  I then made the inferior skin flap in usual fashion and then out to the latissimus and then removed the breast taking the fascia.  When I got to the clavipectoral fascia, I divided that, opened it, and divided the breast off of the chest wall.  I irrigated, made sure everything was dry.  At this point, the pathologist, Dr. Lyndon Code, called to report that at least three of the four nodes were positive.  I therefore went ahead with the completion of axillary dissection. Identified the area of the axillary vein and swept the axillary contents out, preserving long thoracodorsal nerves.  There was one other node that I thought was suspicious.  I tried not to go above the vein or posterior to it and then just the tissue off of the latissimus.  I spent again several minutes irrigating.  Hemostasis was with either cautery or clips.  Once I was convinced everything was dry, I got ready to close.  After I finished the mastectomy portion, I had already put a medial drain in so here I put a lateral drain in, these were both 19 Blakes.  Once the drains were secured, I did another irrigation.  I  then closed with interrupted 3-0 Vicryl running 4-0 Monocryl subcuticular, Dermabond, and Steri-Strips.  The patient tolerated the procedure well.  There were no complications. All counts were correct.  Estimated blood loss was something under 100 mL.  There were no operative complications.     Haywood Lasso, M.D.     CJS/MEDQ  D:  10/23/2010  T:  10/23/2010  Job:  [id number]  cc:   Teodoro Kil. Jonny Ruiz, M.D.  Electronically Signed by [name] M.D. on 10/25/2010 06:19:34 AM

## 2010-10-30 ENCOUNTER — Telehealth (INDEPENDENT_AMBULATORY_CARE_PROVIDER_SITE_OTHER): Payer: Self-pay | Admitting: General Surgery

## 2010-10-30 NOTE — Telephone Encounter (Signed)
Patient's husband left a voicemail asking for path results. I called and told him Dr Margot Chimes had not reviewed them and given me a message to call, so I wrote a message to him and let him know they were looking for the results. I made them aware that he is out of town and we would call them as soon as we could. He also asked about bathing, I let him know as long as they kept the drain/ wound out of the water that she could bath and sponge bath around it. Her drain has been draining around 20 cc twice a day, but slowed down today. They have an appt early next week. I let him know if it was consistently under 20 cc until Thursday to give me a call and I would try to fit her in this week. He agreed with this plan and will call with any problems.

## 2010-11-01 ENCOUNTER — Encounter (INDEPENDENT_AMBULATORY_CARE_PROVIDER_SITE_OTHER): Payer: Self-pay | Admitting: Surgery

## 2010-11-01 ENCOUNTER — Ambulatory Visit (INDEPENDENT_AMBULATORY_CARE_PROVIDER_SITE_OTHER): Payer: Medicare Other | Admitting: Surgery

## 2010-11-01 VITALS — BP 122/86 | HR 60 | Temp 98.6°F | Resp 16 | Ht 68.0 in | Wt 151.0 lb

## 2010-11-01 DIAGNOSIS — C50419 Malignant neoplasm of upper-outer quadrant of unspecified female breast: Secondary | ICD-10-CM

## 2010-11-01 NOTE — Progress Notes (Signed)
Vanessa Mills    [id number] 11/01/2010    02-22-1960   CC: Post op   HPI: The patient returns for post op follow-up. She underwent a Left MRM on 10/23/10. Over all she feels that she is doing well.   PE: The incision is healing nicely and there is no evidence of infection or hematoma.  The drains are slowing.[name]  DATA REVIEWED: Pathology report showed T2N2 cancer  IMPRESSION: Patient doing well. Medial drain ready to remove  PLAN: Her next visit will be in four days. I discussed her path and reviewed PAC placement if she needs one for chemo. I told her it is likely she will need chemo.

## 2010-11-01 NOTE — Patient Instructions (Signed)
Change the dressings as needed on the drain. The incision does not need another dressing.  You need to go to the ABC class to learn about her shoulder exercises and mobility as well as lymphedema prevention.  Come to your next appointment as scheduled. Call if there are any problems before that.

## 2010-11-02 LAB — COMPREHENSIVE METABOLIC PANEL
BUN: 23 mg/dL (ref 6–23)
CO2: 26 mEq/L (ref 19–32)
Calcium: 9.9 mg/dL (ref 8.4–10.5)
Creatinine, Ser: 1.1 mg/dL (ref 0.4–1.2)
GFR calc non Af Amer: 53 mL/min — ABNORMAL LOW (ref >60)
Glucose, Bld: 104 mg/dL — ABNORMAL HIGH (ref 70–99)

## 2010-11-02 LAB — URINALYSIS, ROUTINE W REFLEX MICROSCOPIC
Protein, ur: NEGATIVE mg/dL
Urobilinogen, UA: 0.2 mg/dL (ref 0.0–1.0)

## 2010-11-02 LAB — POCT TOXICOLOGY PANEL
Benzodiazepines: POSITIVE
TCA Scrn: POSITIVE

## 2010-11-02 LAB — DIFFERENTIAL
Eosinophils Absolute: 0 10*3/uL (ref 0.0–0.7)
Lymphocytes Relative: 20 % (ref 12–46)
Lymphs Abs: 2.4 10*3/uL (ref 0.7–4.0)
Neutro Abs: 8.3 10*3/uL — ABNORMAL HIGH (ref 1.7–7.7)
Neutrophils Relative %: 69 % (ref 43–77)

## 2010-11-02 LAB — URINE MICROSCOPIC-ADD ON

## 2010-11-02 LAB — CBC
Hemoglobin: 14.2 g/dL (ref 12.0–15.0)
MCHC: 33.8 g/dL (ref 30.0–36.0)
MCV: 91.5 fL (ref 78.0–100.0)
RBC: 4.6 MIL/uL (ref 3.87–5.11)

## 2010-11-02 LAB — VALPROIC ACID LEVEL: Valproic Acid Lvl: <10 ug/mL — ABNORMAL LOW (ref 50.0–100.0)

## 2010-11-02 LAB — SALICYLATE LEVEL: Salicylate Lvl: <1 mg/dL — ABNORMAL LOW (ref 2.8–20.0)

## 2010-11-05 ENCOUNTER — Other Ambulatory Visit: Payer: Self-pay | Admitting: Oncology

## 2010-11-05 ENCOUNTER — Encounter (INDEPENDENT_AMBULATORY_CARE_PROVIDER_SITE_OTHER): Payer: Self-pay | Admitting: General Surgery

## 2010-11-05 ENCOUNTER — Ambulatory Visit (INDEPENDENT_AMBULATORY_CARE_PROVIDER_SITE_OTHER): Payer: Medicare Other | Admitting: General Surgery

## 2010-11-05 ENCOUNTER — Encounter (HOSPITAL_BASED_OUTPATIENT_CLINIC_OR_DEPARTMENT_OTHER): Payer: Medicare Other | Admitting: Oncology

## 2010-11-05 VITALS — BP 118/88 | HR 64 | Temp 96.8°F | Resp 20 | Ht 68.0 in | Wt 153.1 lb

## 2010-11-05 DIAGNOSIS — Z09 Encounter for follow-up examination after completed treatment for conditions other than malignant neoplasm: Secondary | ICD-10-CM

## 2010-11-05 DIAGNOSIS — C50919 Malignant neoplasm of unspecified site of unspecified female breast: Secondary | ICD-10-CM

## 2010-11-05 DIAGNOSIS — C50419 Malignant neoplasm of upper-outer quadrant of unspecified female breast: Secondary | ICD-10-CM

## 2010-11-05 LAB — CBC WITH DIFFERENTIAL/PLATELET
BASO%: 0.3 % (ref 0.0–2.0)
EOS%: 2.4 % (ref 0.0–7.0)
HCT: 34.7 % — ABNORMAL LOW (ref 34.8–46.6)
LYMPH%: 27.2 % (ref 14.0–49.7)
MCH: 31.9 pg (ref 25.1–34.0)
MCHC: 34.2 g/dL (ref 31.5–36.0)
MCV: 93.4 fL (ref 79.5–101.0)
MONO%: 6.4 % (ref 0.0–14.0)
NEUT%: 63.7 % (ref 38.4–76.8)
Platelets: 299 10*3/uL (ref 145–400)

## 2010-11-05 LAB — COMPREHENSIVE METABOLIC PANEL
ALT: 14 U/L (ref 0–35)
AST: 18 U/L (ref 0–37)
Creatinine, Ser: 0.73 mg/dL (ref 0.50–1.10)
Total Bilirubin: 0.3 mg/dL (ref 0.3–1.2)

## 2010-11-05 NOTE — Progress Notes (Signed)
Subjective:     Patient ID: Vanessa Mills, female   DOB: 06/11/1960, 50 y.o.   MRN: [id number]  HPI This is a 50 year old female patient of Dr. Margot Chimes who comes in after a left modified radical mastectomy to have her second drain removed. She reports no complaints and her drain has put out less than 10 cc for the last 2 days.  Review of Systems     Objective:   Physical Exam Healing left mastectomy incision without infection    Assessment:     S/p mrm    Plan:         I removed her drain today. I told her she can shower. She's going to see Dr. Margot Chimes in  2 weeks. I  encouraged her to begin her exercises and OT after breast cancer physical therapy class.

## 2010-11-06 ENCOUNTER — Telehealth (INDEPENDENT_AMBULATORY_CARE_PROVIDER_SITE_OTHER): Payer: Self-pay | Admitting: General Surgery

## 2010-11-06 MED ORDER — OXYCODONE-ACETAMINOPHEN 5-325 MG PO TABS: 1.0000 | ORAL_TABLET | ORAL | Status: AC | PRN

## 2010-11-06 NOTE — Telephone Encounter (Signed)
Patient left message stating she is having pain in her mastectomy site and asking for a refill of her pain medicine.  Ok per Dr Margot Chimes. Please have partner write percocet 5/325 #30 with no refill. Patient aware and will pick up.

## 2010-11-07 ENCOUNTER — Telehealth (INDEPENDENT_AMBULATORY_CARE_PROVIDER_SITE_OTHER): Payer: Self-pay | Admitting: Surgery

## 2010-11-07 DIAGNOSIS — C50919 Malignant neoplasm of unspecified site of unspecified female breast: Secondary | ICD-10-CM

## 2010-11-07 DIAGNOSIS — M25619 Stiffness of unspecified shoulder, not elsewhere classified: Secondary | ICD-10-CM

## 2010-11-07 NOTE — Telephone Encounter (Signed)
When patient calls back, also need to inform that Dr Radene Journey an order for physical therapy. He tends to wait until after a patient attends ABC class, but since patient is very stiff and sore he gave the okay to order physical therapy.

## 2010-11-07 NOTE — Telephone Encounter (Signed)
Left message for patient to call me back. 

## 2010-11-08 NOTE — Telephone Encounter (Signed)
Spoke with patient, made aware the only restriction is no heavy lifting with her arm yet. Also made aware she could go ahead and get cotton bras from [facility] [employer] [place] [facility], because she inquired about doing that. She said the oncologist did tell her that she will need a PAC placed. I told her they usually contact our scheduling department and Dr Margot Chimes will write orders for it, but that I hadn't heard anything. Patient to call back if she doesn't hear from them.

## 2010-11-12 ENCOUNTER — Encounter (HOSPITAL_COMMUNITY)
Admission: RE | Admit: 2010-11-12 | Discharge: 2010-11-12 | Disposition: A | Discharge status: Home / Self Care | Payer: Medicare Other | Source: Ambulatory Visit | Attending: Oncology | Admitting: Oncology

## 2010-11-12 DIAGNOSIS — C50919 Malignant neoplasm of unspecified site of unspecified female breast: Secondary | ICD-10-CM

## 2010-11-12 DIAGNOSIS — Z9010 Acquired absence of unspecified breast and nipple: Secondary | ICD-10-CM | POA: Insufficient documentation

## 2010-11-12 DIAGNOSIS — Z90710 Acquired absence of both cervix and uterus: Secondary | ICD-10-CM | POA: Insufficient documentation

## 2010-11-12 DIAGNOSIS — Z79899 Other long term (current) drug therapy: Secondary | ICD-10-CM | POA: Insufficient documentation

## 2010-11-12 MED ORDER — FLUDEOXYGLUCOSE F - 18 (FDG) INJECTION
20.4000 | Freq: Once | INTRAVENOUS | Status: AC | PRN
  Administered 2010-11-12: 20.4 via INTRAVENOUS

## 2010-11-14 ENCOUNTER — Ambulatory Visit (HOSPITAL_COMMUNITY)
Admission: RE | Admit: 2010-11-14 | Discharge: 2010-11-14 | Disposition: A | Discharge status: Home / Self Care | Payer: Medicare Other | Source: Ambulatory Visit | Attending: Oncology | Admitting: Oncology

## 2010-11-14 DIAGNOSIS — Z01818 Encounter for other preprocedural examination: Secondary | ICD-10-CM | POA: Insufficient documentation

## 2010-11-14 DIAGNOSIS — Z09 Encounter for follow-up examination after completed treatment for conditions other than malignant neoplasm: Secondary | ICD-10-CM

## 2010-11-14 DIAGNOSIS — C50919 Malignant neoplasm of unspecified site of unspecified female breast: Secondary | ICD-10-CM | POA: Insufficient documentation

## 2010-11-16 ENCOUNTER — Telehealth (INDEPENDENT_AMBULATORY_CARE_PROVIDER_SITE_OTHER): Payer: Self-pay | Admitting: General Surgery

## 2010-11-16 NOTE — Telephone Encounter (Signed)
Pt called into POD [facility] & I answered phone. Pt was calling to schedule PAC insertion surgery with Dr Margot Chimes.  I gave her name & number to one of our schedulers & told her our office would call her

## 2010-11-20 ENCOUNTER — Ambulatory Visit (INDEPENDENT_AMBULATORY_CARE_PROVIDER_SITE_OTHER): Payer: Medicare Other | Admitting: Surgery

## 2010-11-20 ENCOUNTER — Encounter (INDEPENDENT_AMBULATORY_CARE_PROVIDER_SITE_OTHER): Payer: Self-pay | Admitting: Surgery

## 2010-11-20 VITALS — BP 128/88 | HR 60 | Temp 97.4°F | Resp 16 | Ht 68.0 in | Wt 149.5 lb

## 2010-11-20 DIAGNOSIS — C50419 Malignant neoplasm of upper-outer quadrant of unspecified female breast: Secondary | ICD-10-CM

## 2010-11-20 NOTE — Patient Instructions (Signed)
Continue to do range of motion exercises for your left shoulder.  We will schedule you to have a port a cath placed as an outpatient

## 2010-11-20 NOTE — Progress Notes (Signed)
Vanessa Mills    [id number] 11/20/2010    1960/07/10   CC: Post op   HPI: The patient returns for post op follow-up. She underwent a Left MRM on 10/23/10. Over all she feels that she is doing well.   PE: The incision is healing nicely and there is no evidence of infection or hematoma.  The drains are out  DATA REVIEWED: No new data  IMPRESSION: Patient doing well.Needs port  PLAN: Will schedule port - discussed procedure and explained risks. She wishes to proceed

## 2010-11-26 ENCOUNTER — Encounter (HOSPITAL_COMMUNITY): Payer: Self-pay

## 2010-11-26 ENCOUNTER — Encounter (HOSPITAL_COMMUNITY)
Admission: RE | Admit: 2010-11-26 | Discharge: 2010-11-26 | Disposition: A | Discharge status: Home / Self Care | Payer: Medicare Other | Source: Ambulatory Visit | Attending: Surgery | Admitting: Surgery

## 2010-11-26 ENCOUNTER — Other Ambulatory Visit: Payer: Self-pay | Admitting: Oncology

## 2010-11-26 ENCOUNTER — Encounter (HOSPITAL_BASED_OUTPATIENT_CLINIC_OR_DEPARTMENT_OTHER): Payer: Medicare Other | Admitting: Oncology

## 2010-11-26 DIAGNOSIS — C50919 Malignant neoplasm of unspecified site of unspecified female breast: Secondary | ICD-10-CM

## 2010-11-26 DIAGNOSIS — Z923 Personal history of irradiation: Secondary | ICD-10-CM

## 2010-11-26 DIAGNOSIS — C50419 Malignant neoplasm of upper-outer quadrant of unspecified female breast: Secondary | ICD-10-CM

## 2010-11-26 DIAGNOSIS — C779 Secondary and unspecified malignant neoplasm of lymph node, unspecified: Secondary | ICD-10-CM

## 2010-11-26 HISTORY — DX: Headache: R51

## 2010-11-26 HISTORY — DX: Gastro-esophageal reflux disease without esophagitis: K21.9

## 2010-11-26 HISTORY — DX: Nausea with vomiting, unspecified: R11.2

## 2010-11-26 HISTORY — DX: Other specified postprocedural states: Z98.890

## 2010-11-26 HISTORY — DX: Mental disorder, not otherwise specified: F99

## 2010-11-26 LAB — CBC
HCT: 40.3 % (ref 36.0–46.0)
Hemoglobin: 13.2 g/dL (ref 12.0–15.0)
MCV: 93.5 fL (ref 78.0–100.0)
RBC: 4.31 MIL/uL (ref 3.87–5.11)
WBC: 6 10*3/uL (ref 4.0–10.5)

## 2010-11-26 LAB — COMPREHENSIVE METABOLIC PANEL
AST: 14 U/L (ref 0–37)
Albumin: 3.8 g/dL (ref 3.5–5.2)
Alkaline Phosphatase: 57 U/L (ref 39–117)
BUN: 18 mg/dL (ref 6–23)
Potassium: 4.6 mEq/L (ref 3.5–5.3)
Sodium: 139 mEq/L (ref 135–145)

## 2010-11-26 LAB — CBC WITH DIFFERENTIAL/PLATELET
Basophils Absolute: 0 10*3/uL (ref 0.0–0.1)
EOS%: 2.4 % (ref 0.0–7.0)
MCH: 31.2 pg (ref 25.1–34.0)
MCV: 93.1 fL (ref 79.5–101.0)
MONO%: 7.7 % (ref 0.0–14.0)
RBC: 4.13 10*6/uL (ref 3.70–5.45)
RDW: 13.8 % (ref 11.2–14.5)

## 2010-11-26 NOTE — Pre-Procedure Instructions (Signed)
[place]  11/26/2010   Your procedure is scheduled on: Wednesday Nov 28, 2010   Report to [facility] at 9:30 AM.  Call this number if you have problems the morning of surgery: [phone]   Remember:   Do not eat food:After Midnight.  Do not drink clear liquids: 4 Hours before arrival.  Take these medicines the morning of surgery with A SIP OF WATER: xanax,valium,propranolol   Do not wear jewelry, make-up or nail polish.  Do not wear lotions, powders, or perfumes. You may wear deodorant.  Do not shave 48 hours prior to surgery.  Do not bring valuables to the hospital.  Contacts, dentures or bridgework may not be worn into surgery.  Leave suitcase in the car. After surgery it may be brought to your room.  For patients admitted to the hospital, checkout time is 11:00 AM the day of discharge.   Patients discharged the day of surgery will not be allowed to drive home.  Name and phone number of your driver: Vanessa Mills [phone]  Special Instructions: CHG Shower Use Special Wash: 1/2 bottle night before surgery and 1/2 bottle morning of surgery.   Please read over the following fact sheets that you were given: Pain Booklet, Coughing and Deep Breathing, MRSA Information and Surgical Site Infection Prevention

## 2010-11-27 ENCOUNTER — Telehealth: Payer: Self-pay | Admitting: *Deleted

## 2010-11-27 LAB — BASIC METABOLIC PANEL
BUN: 17 mg/dL (ref 6–23)
CO2: 31 mEq/L (ref 19–32)
Chloride: 103 mEq/L (ref 96–112)
Creatinine, Ser: 0.73 mg/dL (ref 0.50–1.10)
GFR calc Af Amer: >90 mL/min (ref >90)
Glucose, Bld: 88 mg/dL (ref 70–99)

## 2010-11-28 ENCOUNTER — Ambulatory Visit (HOSPITAL_COMMUNITY)
Admission: RE | Admit: 2010-11-28 | Discharge: 2010-11-28 | Disposition: A | Discharge status: Home / Self Care | Payer: Medicare Other | Source: Ambulatory Visit | Attending: Surgery | Admitting: Surgery

## 2010-11-28 ENCOUNTER — Encounter (INDEPENDENT_AMBULATORY_CARE_PROVIDER_SITE_OTHER): Payer: Self-pay | Admitting: Surgery

## 2010-11-28 ENCOUNTER — Other Ambulatory Visit (INDEPENDENT_AMBULATORY_CARE_PROVIDER_SITE_OTHER): Payer: Self-pay | Admitting: Surgery

## 2010-11-28 DIAGNOSIS — R52 Pain, unspecified: Secondary | ICD-10-CM

## 2010-11-28 DIAGNOSIS — C50919 Malignant neoplasm of unspecified site of unspecified female breast: Secondary | ICD-10-CM

## 2010-11-28 DIAGNOSIS — Z01812 Encounter for preprocedural laboratory examination: Secondary | ICD-10-CM | POA: Insufficient documentation

## 2010-11-28 HISTORY — PX: PORTACATH PLACEMENT: SHX2246

## 2010-11-29 ENCOUNTER — Encounter (HOSPITAL_BASED_OUTPATIENT_CLINIC_OR_DEPARTMENT_OTHER): Payer: Medicare Other | Admitting: Oncology

## 2010-11-29 DIAGNOSIS — Z5111 Encounter for antineoplastic chemotherapy: Secondary | ICD-10-CM

## 2010-11-29 DIAGNOSIS — C50419 Malignant neoplasm of upper-outer quadrant of unspecified female breast: Secondary | ICD-10-CM

## 2010-11-30 ENCOUNTER — Telehealth (INDEPENDENT_AMBULATORY_CARE_PROVIDER_SITE_OTHER): Payer: Self-pay | Admitting: General Surgery

## 2010-11-30 ENCOUNTER — Encounter (HOSPITAL_BASED_OUTPATIENT_CLINIC_OR_DEPARTMENT_OTHER): Payer: Medicare Other | Admitting: Oncology

## 2010-11-30 DIAGNOSIS — C50419 Malignant neoplasm of upper-outer quadrant of unspecified female breast: Secondary | ICD-10-CM

## 2010-11-30 NOTE — Telephone Encounter (Signed)
LMOM on machine with appt date and time.

## 2010-11-30 NOTE — Op Note (Signed)
  NAME:  Vanessa Mills, Vanessa Mills NO.:  [id number]  MEDICAL RECORD NO.:  [id number]  LOCATION:  XRAY                         FACILITY:  [facility]  PHYSICIAN:  Haywood Lasso, M.D.DATE OF BIRTH:  [birth year]  DATE OF PROCEDURE: DATE OF DISCHARGE:                              OPERATIVE REPORT   PREOPERATIVE DIAGNOSIS:  Carcinoma of breast.  POSTOPERATIVE DIAGNOSIS:  Carcinoma of breast.  PROCEDURE:  Port-A-Cath placement.  SURGEON:  Haywood Lasso, MD  ASSISTANT:  Hale Drone, PA student.  ANESTHESIA:  General.  CLINICAL HISTORY:  This is a 50 year old lady getting ready to start chemotherapy tomorrow and she needed a Port-A-Cath for IV access.  DESCRIPTION OF PROCEDURE:  I saw the patient in the holding area.  She had no further questions.  We reviewed the plans for the procedure.  The patient was taken to the operating room, and after satisfactory general anesthesia had been obtained, the upper chest and lower neck were prepped and draped as a sterile field.  The time-out was done.  The patient was placed in Trendelenburg position.  I was able to enter the subclavian vein on the 2nd attempt and the guidewire threaded easily and with fluoro was positioned into the superior vena cava right atrial area.  I put some 0.25% plain Marcaine in the anterior chest wall and made a transverse incision, fashioned a pocket with cautery.  I brought the Port-A-Cath tubing from the port site into the guidewire site.  Using fluoro, I advanced the peel-away sheath dilator over the guidewire until it was in the superior vena cava.  The dilator and guidewire were removed and the Port-A-Cath tubing threaded to approximately 20 cm with the sheath then being removed.  Using fluoro, I backed the catheter up to about 17 cm and I used some contrast to make sure I could see the catheter well.  It appeared to be at the distal SVC.  The catheter aspirated and flushed easily.  The  reservoir was flushed, attached, and locking mechanism engaged.  This aspirated and flushed easily.  It was placed in the pocket.  Final fluoro check was made and there was no kinking, tubing and the tip appeared to be in good position.  The incision was closed with 3-0 Vicryl and Dermabond.  I used a Huber point needle to place a needle in it and flushed that with dilute and then concentrated aqueous heparin and left that in so she could have chemo tomorrow.     Haywood Lasso, M.D.     CJS/MEDQ  D:  11/28/2010  T:  11/28/2010  Job:  [id number]  Electronically Signed by [name] M.D. on 11/30/2010 50:15:86 AM

## 2010-12-05 ENCOUNTER — Other Ambulatory Visit: Payer: Self-pay | Admitting: Oncology

## 2010-12-06 ENCOUNTER — Other Ambulatory Visit: Payer: Self-pay | Admitting: Oncology

## 2010-12-06 ENCOUNTER — Ambulatory Visit: Payer: Medicare Other

## 2010-12-06 ENCOUNTER — Other Ambulatory Visit (HOSPITAL_BASED_OUTPATIENT_CLINIC_OR_DEPARTMENT_OTHER): Payer: Medicare Other | Admitting: Lab

## 2010-12-06 ENCOUNTER — Ambulatory Visit (HOSPITAL_BASED_OUTPATIENT_CLINIC_OR_DEPARTMENT_OTHER): Payer: Medicare Other | Admitting: Oncology

## 2010-12-06 VITALS — BP 115/71 | HR 58 | Temp 98.4°F | Ht 68.0 in | Wt 152.5 lb

## 2010-12-06 DIAGNOSIS — Z170 Estrogen receptor positive status [ER+]: Secondary | ICD-10-CM

## 2010-12-06 DIAGNOSIS — C50919 Malignant neoplasm of unspecified site of unspecified female breast: Secondary | ICD-10-CM

## 2010-12-06 DIAGNOSIS — D72819 Decreased white blood cell count, unspecified: Secondary | ICD-10-CM

## 2010-12-06 DIAGNOSIS — C50419 Malignant neoplasm of upper-outer quadrant of unspecified female breast: Secondary | ICD-10-CM

## 2010-12-06 DIAGNOSIS — C779 Secondary and unspecified malignant neoplasm of lymph node, unspecified: Secondary | ICD-10-CM

## 2010-12-06 LAB — CBC WITH DIFFERENTIAL/PLATELET
BASO%: 0.9 % (ref 0.0–2.0)
EOS%: 16 % — ABNORMAL HIGH (ref 0.0–7.0)
HCT: 36 % (ref 34.8–46.6)
LYMPH%: 56 % — ABNORMAL HIGH (ref 14.0–49.7)
MCH: 31.8 pg (ref 25.1–34.0)
MCHC: 34.3 g/dL (ref 31.5–36.0)
MCV: 92.6 fL (ref 79.5–101.0)
MONO%: 1.8 % (ref 0.0–14.0)
NEUT%: 25.3 % — ABNORMAL LOW (ref 38.4–76.8)
Platelets: 155 10*3/uL (ref 145–400)

## 2010-12-06 LAB — COMPREHENSIVE METABOLIC PANEL
ALT: 10 U/L (ref 0–35)
AST: 13 U/L (ref 0–37)
BUN: 16 mg/dL (ref 6–23)
Creatinine, Ser: 0.7 mg/dL (ref 0.50–1.10)
Total Bilirubin: 0.7 mg/dL (ref 0.3–1.2)

## 2010-12-06 NOTE — Progress Notes (Signed)
CC:   Carola J. Jonny Ruiz, M.D. Haywood Lasso, M.D. Blair Promise, Ph.D., M.D. Fannie Knee, M.D. Patria Mane, MD Chucky May, M.D.  DIAGNOSIS:  A 50 year old female with invasive lobular carcinoma of the left breast with associated lobular carcinoma in situ, clinical stage IIIA (T2 N2).  The patient is status post left mastectomy with axillary lymph node dissection on 10/23/2010.  CURRENT THERAPY:  The patient is now status post cycle 1 day 1 of Adriamycin and Cytoxan given 11/29/2010.  INTERVAL HISTORY:  The patient is seen in followup today.  Overall she is doing well.  She was able to tolerate chemotherapy well.  She is in fact apparently very pleasantly surprised how well she was able to do. She has no nausea, no vomiting, no fevers, no chills, no night sweats, no headaches, and no shortness of breath.  She has no peripheral paresthesias.  The remainder of the 10-point review of systems is negative.  ALLERGIES:  No known drug allergies.  MEDICATIONS:  Current medications are reviewed and they are recorded in the electronic medical record.  PHYSICAL EXAMINATION:  The patient is awake, alert; appears well.  Vital Signs:  Temperature is 98.4, pulse 58, respirations 20, blood pressure 115/71.  HEENT:  EOMI.  PERRLA.  Sclerae are anicteric.  No conjunctival pallor.  Oral mucosa is moist.  Neck:  Supple.  Lungs:  Clear bilaterally to auscultation and percussion.  Cardiovascular:  Regular rate and rhythm.  No murmurs, gallops or rubs.  Abdomen:  Soft, nontender, nondistended.  Bowel sounds are present.  No HSM. Extremities:  No edema.  Neuro:  Patient is alert, oriented; otherwise nonfocal.  LABORATORY DATA:  WBC 2.2, hemoglobin 12.4, hematocrit 36.0, platelets 155,000, ANC 0.5.  IMPRESSION AND PLAN:  A 50 year old female with stage IIIA invasive ductal and lobular carcinoma, status post mastectomy with axillary lymph node dissection, with the final pathology  revealing a 5.0 cm invasive ductal carcinoma with lobular features.  She had 6 lymph nodes that were positive for metastatic disease.  Tumor was ER receptor positive, PR receptor positive, HER-2/neu negative, with a proliferation marker Ki-67 of 12%.  She is receiving adjuvant chemotherapy.  She received her 1st cycle of Adriamycin and Cytoxan on 11/29/2010.  Once she completes 4 cycles of this, then our plan would be to start her on weekly Taxol for 12 weeks.  Her Port-A-Cath site looks great.  She has the appropriate prescriptions.  Her white count is slightly low with a total white of 2.2 and an ANC that is only 0.5.  I have given her a prescription for Cipro 500 mg twice a day number 14.  She will begin taking these today.  Also, neutropenic precautions were discussed with the patient.  She knows how to call me if any problems occur.    ______________________________ Marcy Panning, M.D. KK/MEDQ  D:  12/06/2010  T:  12/06/2010  Job:  [id number]

## 2010-12-07 ENCOUNTER — Encounter: Payer: Self-pay | Admitting: Oncology

## 2010-12-07 ENCOUNTER — Telehealth: Payer: Self-pay | Admitting: *Deleted

## 2010-12-07 NOTE — Telephone Encounter (Signed)
Pt called states " I started Cipro yesterday and I' ve had diarrhea today and I think it's the antibiotic. " Pt denies fever, chills, vomiting.  Per Dr. Humphrey Rolls, pt to stop Cipro, monitor for fevers , push no-caffinated fluids over weekend.  Pt advised she has Immodium at the house she will try if has more diarrhea. Pt verbalized instructions.

## 2010-12-13 ENCOUNTER — Ambulatory Visit (HOSPITAL_BASED_OUTPATIENT_CLINIC_OR_DEPARTMENT_OTHER): Payer: Medicare Other

## 2010-12-13 VITALS — BP 119/76 | HR 50 | Temp 97.0°F

## 2010-12-13 DIAGNOSIS — C50419 Malignant neoplasm of upper-outer quadrant of unspecified female breast: Secondary | ICD-10-CM

## 2010-12-13 DIAGNOSIS — Z5111 Encounter for antineoplastic chemotherapy: Secondary | ICD-10-CM

## 2010-12-13 MED ORDER — DEXAMETHASONE SODIUM PHOSPHATE 4 MG/ML IJ SOLN
12.0000 mg | Freq: Once | INTRAMUSCULAR | Status: AC
  Administered 2010-12-13: 12 mg via INTRAVENOUS

## 2010-12-13 MED ORDER — SODIUM CHLORIDE 0.9 % IJ SOLN
10.0000 mL | INTRAMUSCULAR | Status: DC | PRN
  Administered 2010-12-13: 10 mL
  Filled 2010-12-13: qty 10

## 2010-12-13 MED ORDER — SODIUM CHLORIDE 0.9 % IV SOLN
Freq: Once | INTRAVENOUS | Status: AC
  Administered 2010-12-13: 09:00:00 via INTRAVENOUS

## 2010-12-13 MED ORDER — DOXORUBICIN HCL CHEMO IV INJECTION 2 MG/ML
60.0000 mg/m2 | Freq: Once | INTRAVENOUS | Status: AC
  Administered 2010-12-13: 110 mg via INTRAVENOUS
  Filled 2010-12-13: qty 55

## 2010-12-13 MED ORDER — HEPARIN SOD (PORK) LOCK FLUSH 100 UNIT/ML IV SOLN
500.0000 [IU] | Freq: Once | INTRAVENOUS | Status: AC | PRN
  Administered 2010-12-13: 500 [IU]
  Filled 2010-12-13: qty 5

## 2010-12-13 MED ORDER — SODIUM CHLORIDE 0.9 % IV SOLN
150.0000 mg | Freq: Once | INTRAVENOUS | Status: AC
  Administered 2010-12-13: 150 mg via INTRAVENOUS
  Filled 2010-12-13: qty 5

## 2010-12-13 MED ORDER — SODIUM CHLORIDE 0.9 % IV SOLN
600.0000 mg/m2 | Freq: Once | INTRAVENOUS | Status: AC
  Administered 2010-12-13: 1100 mg via INTRAVENOUS
  Filled 2010-12-13: qty 55

## 2010-12-13 MED ORDER — PALONOSETRON HCL INJECTION 0.25 MG/5ML
0.2500 mg | Freq: Once | INTRAVENOUS | Status: AC
  Administered 2010-12-13: 0.25 mg via INTRAVENOUS

## 2010-12-14 ENCOUNTER — Encounter (INDEPENDENT_AMBULATORY_CARE_PROVIDER_SITE_OTHER): Payer: Self-pay | Admitting: Surgery

## 2010-12-14 ENCOUNTER — Ambulatory Visit (HOSPITAL_BASED_OUTPATIENT_CLINIC_OR_DEPARTMENT_OTHER): Payer: Medicare Other

## 2010-12-14 VITALS — BP 125/70 | HR 62 | Temp 99.2°F

## 2010-12-14 DIAGNOSIS — C50419 Malignant neoplasm of upper-outer quadrant of unspecified female breast: Secondary | ICD-10-CM

## 2010-12-14 MED ORDER — PEGFILGRASTIM INJECTION 6 MG/0.6ML
6.0000 mg | Freq: Once | SUBCUTANEOUS | Status: AC
  Administered 2010-12-14: 6 mg via SUBCUTANEOUS
  Filled 2010-12-14: qty 0.6

## 2010-12-21 ENCOUNTER — Telehealth: Payer: Self-pay | Admitting: *Deleted

## 2010-12-21 MED ORDER — AMBULATORY NON FORMULARY MEDICATION: Status: DC

## 2010-12-21 NOTE — Telephone Encounter (Signed)
Pt called request something for mouth sores. MD out of office. Reviewed with Dr. Truddie Coco, VO for Magic Mouthwash. Per pt request RX to [facility]

## 2010-12-25 ENCOUNTER — Ambulatory Visit (INDEPENDENT_AMBULATORY_CARE_PROVIDER_SITE_OTHER): Payer: Medicare Other | Admitting: Surgery

## 2010-12-25 ENCOUNTER — Encounter (INDEPENDENT_AMBULATORY_CARE_PROVIDER_SITE_OTHER): Payer: Self-pay | Admitting: Surgery

## 2010-12-25 VITALS — Ht 68.0 in | Wt 154.0 lb

## 2010-12-25 DIAGNOSIS — Z09 Encounter for follow-up examination after completed treatment for conditions other than malignant neoplasm: Secondary | ICD-10-CM

## 2010-12-25 NOTE — Progress Notes (Signed)
NAME: Vanessa Mills                                            DOB: 1960/12/18 DATE: 12/25/2010                                                  MRN: [id number]  CC: Post op   HPI: This patient comes in for post op follow-up.Sheunderwent PAC on 10/31/201. She feels that she is doing well.  PE: General: The patient appears to be healthy, NAD Port site healing nicely  DATA REVIEWED: No new data  IMPRESSION: The patient is doing well S/P Port .    PLAN: RTC six months

## 2010-12-27 ENCOUNTER — Other Ambulatory Visit: Payer: Self-pay | Admitting: *Deleted

## 2010-12-27 ENCOUNTER — Encounter (INDEPENDENT_AMBULATORY_CARE_PROVIDER_SITE_OTHER): Payer: Medicare Other | Admitting: Surgery

## 2010-12-27 ENCOUNTER — Other Ambulatory Visit: Payer: Self-pay | Admitting: Oncology

## 2010-12-27 ENCOUNTER — Ambulatory Visit (HOSPITAL_BASED_OUTPATIENT_CLINIC_OR_DEPARTMENT_OTHER): Payer: Medicare Other

## 2010-12-27 ENCOUNTER — Encounter: Payer: Medicare Other | Admitting: Oncology

## 2010-12-27 ENCOUNTER — Telehealth: Payer: Self-pay | Admitting: *Deleted

## 2010-12-27 VITALS — BP 124/82 | HR 48 | Temp 97.8°F

## 2010-12-27 DIAGNOSIS — C50419 Malignant neoplasm of upper-outer quadrant of unspecified female breast: Secondary | ICD-10-CM

## 2010-12-27 DIAGNOSIS — Z5111 Encounter for antineoplastic chemotherapy: Secondary | ICD-10-CM

## 2010-12-27 LAB — CBC WITH DIFFERENTIAL/PLATELET
BASO%: 0.7 % (ref 0.0–2.0)
Basophils Absolute: 0.1 10*3/uL (ref 0.0–0.1)
EOS%: 0.4 % (ref 0.0–7.0)
HCT: 37 % (ref 34.8–46.6)
HGB: 12 g/dL (ref 11.6–15.9)
LYMPH%: 20.8 % (ref 14.0–49.7)
MCH: 30.4 pg (ref 25.1–34.0)
MCHC: 32.4 g/dL (ref 31.5–36.0)
MONO#: 0.7 10*3/uL (ref 0.1–0.9)
NEUT%: 68.8 % (ref 38.4–76.8)
Platelets: 304 10*3/uL (ref 145–400)

## 2010-12-27 LAB — BASIC METABOLIC PANEL
Calcium: 9.2 mg/dL (ref 8.4–10.5)
Creatinine, Ser: 0.72 mg/dL (ref 0.50–1.10)

## 2010-12-27 MED ORDER — SODIUM CHLORIDE 0.9 % IV SOLN
150.0000 mg | Freq: Once | INTRAVENOUS | Status: AC
  Administered 2010-12-27: 150 mg via INTRAVENOUS
  Filled 2010-12-27: qty 5

## 2010-12-27 MED ORDER — DOXORUBICIN HCL CHEMO IV INJECTION 2 MG/ML
60.0000 mg/m2 | Freq: Once | INTRAVENOUS | Status: AC
  Administered 2010-12-27: 110 mg via INTRAVENOUS
  Filled 2010-12-27: qty 55

## 2010-12-27 MED ORDER — SODIUM CHLORIDE 0.9 % IV SOLN
Freq: Once | INTRAVENOUS | Status: AC
  Administered 2010-12-27: 11:00:00 via INTRAVENOUS

## 2010-12-27 MED ORDER — SODIUM CHLORIDE 0.9 % IV SOLN
600.0000 mg/m2 | Freq: Once | INTRAVENOUS | Status: AC
  Administered 2010-12-27: 1100 mg via INTRAVENOUS
  Filled 2010-12-27: qty 55

## 2010-12-27 MED ORDER — DEXAMETHASONE SODIUM PHOSPHATE 4 MG/ML IJ SOLN
12.0000 mg | Freq: Once | INTRAMUSCULAR | Status: AC
  Administered 2010-12-27: 20 mg via INTRAVENOUS

## 2010-12-27 MED ORDER — PALONOSETRON HCL INJECTION 0.25 MG/5ML
0.2500 mg | Freq: Once | INTRAVENOUS | Status: AC
  Administered 2010-12-27: 0.25 mg via INTRAVENOUS

## 2010-12-27 MED ORDER — SODIUM CHLORIDE 0.9 % IJ SOLN
3.0000 mL | INTRAMUSCULAR | Status: DC | PRN
  Filled 2010-12-27: qty 10

## 2010-12-27 MED ORDER — SODIUM CHLORIDE 0.9 % IJ SOLN
10.0000 mL | INTRAMUSCULAR | Status: DC | PRN
  Administered 2010-12-27: 10 mL
  Filled 2010-12-27: qty 10

## 2010-12-27 MED ORDER — HEPARIN SOD (PORK) LOCK FLUSH 100 UNIT/ML IV SOLN
500.0000 [IU] | Freq: Once | INTRAVENOUS | Status: AC | PRN
  Administered 2010-12-27: 500 [IU]
  Filled 2010-12-27: qty 5

## 2010-12-27 NOTE — Patient Instructions (Signed)
1325-Pt discharged ambulatory with next appointment confirmed.  Pt aware to call with any questions or concerns.

## 2010-12-27 NOTE — Telephone Encounter (Signed)
patient comes in on 12-28-2010 for injection will give patinet her schedule on 12-28-2010

## 2010-12-27 NOTE — Progress Notes (Signed)
This encounter was created in error - please disregard.

## 2010-12-28 ENCOUNTER — Encounter: Payer: Self-pay | Admitting: Oncology

## 2010-12-28 ENCOUNTER — Ambulatory Visit (HOSPITAL_BASED_OUTPATIENT_CLINIC_OR_DEPARTMENT_OTHER): Payer: Medicare Other | Admitting: Oncology

## 2010-12-28 ENCOUNTER — Ambulatory Visit (HOSPITAL_BASED_OUTPATIENT_CLINIC_OR_DEPARTMENT_OTHER): Payer: Medicare Other

## 2010-12-28 ENCOUNTER — Telehealth: Payer: Self-pay | Admitting: *Deleted

## 2010-12-28 VITALS — BP 110/65 | HR 58 | Temp 98.1°F

## 2010-12-28 VITALS — BP 114/71 | HR 53 | Temp 98.4°F | Ht 68.0 in | Wt 157.4 lb

## 2010-12-28 DIAGNOSIS — Z170 Estrogen receptor positive status [ER+]: Secondary | ICD-10-CM

## 2010-12-28 DIAGNOSIS — R5383 Other fatigue: Secondary | ICD-10-CM

## 2010-12-28 DIAGNOSIS — C773 Secondary and unspecified malignant neoplasm of axilla and upper limb lymph nodes: Secondary | ICD-10-CM

## 2010-12-28 DIAGNOSIS — Z09 Encounter for follow-up examination after completed treatment for conditions other than malignant neoplasm: Secondary | ICD-10-CM

## 2010-12-28 DIAGNOSIS — C50919 Malignant neoplasm of unspecified site of unspecified female breast: Secondary | ICD-10-CM

## 2010-12-28 DIAGNOSIS — C50419 Malignant neoplasm of upper-outer quadrant of unspecified female breast: Secondary | ICD-10-CM

## 2010-12-28 HISTORY — DX: Other fatigue: R53.83

## 2010-12-28 HISTORY — DX: Encounter for follow-up examination after completed treatment for conditions other than malignant neoplasm: Z09

## 2010-12-28 MED ORDER — PEGFILGRASTIM INJECTION 6 MG/0.6ML
6.0000 mg | Freq: Once | SUBCUTANEOUS | Status: AC
  Administered 2010-12-28: 6 mg via SUBCUTANEOUS
  Filled 2010-12-28: qty 0.6

## 2010-12-28 NOTE — Progress Notes (Signed)
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  PRIOR THERAPY:  #1 patient is status post mastectomy of the left breast with axillary lymph node dissection on 10/23/2010 appear  #2 the final pathology revealed a 5.0 cm invasive ductal carcinoma with lobular features 6 nodes were positive for metastatic disease. The tumor was estrogen receptor positive progesterone receptor positive HER-2/neu negative with a proliferation marker of 12%.  #3 patient has now gone on to receive adjuvant chemotherapy. She is currently receiving Adriamycin and Cytoxan given dose dense or a total of 4 cycles. She does far has completed 3 cycles.  #4 patient completes the a.c. she will then go on to receive weekly Taxolf or a total of 12 weeks.  CURRENT THERAPY: Patient is status post cycle #3 of dose dense Adriamycin and Cytoxan  INTERVAL HISTORY: Vanessa Mills 50 y.o. female returns for followup visit. Her last cycle was given on 12/27/2010. She thus far has been treated tolerating the treatment quite well without any significant complaints. She has not had any nausea or she does have some fatigue. She also did develop some mucositis. However she uses [employer] mouthwash and that helped her significantly. She has not had any fevers chills night sweats shortness of breath chest pains palpitations no myalgias or arthralgias associated with a hematuria hematochezia melena hemoptysis or hematemesis. Remainder of the 10 point review of systems is negative.  MEDICAL HISTORY: Past Medical History  Diagnosis Date  . Chronic mental illness   . Meniere's syndrome   . Crohn's disease   . PONV (postoperative nausea and vomiting)   . Raynaud's disease   . GERD  (gastroesophageal reflux disease)     does not take medications for   . Headache     takes midodrine for migraines prn  . Breast cancer, ILC, Left, receptor+, Her2- 10/10/2010  . Mental disorder     bipolar, takes saphris at hs  . Chemotherapy follow-up examination 12/28/2010  . Fatigue 12/28/2010    ALLERGIES:   has no known allergies.  MEDICATIONS:  Current Outpatient Prescriptions  Medication Sig Dispense Refill  . ALPRAZolam (XANAX) 1 MG tablet Take 1 mg by mouth daily as needed.        [name] asenapine (SAPHRIS) 5 MG SUBL Place 10 mg under the tongue.        . diazepam (VALIUM) 10 MG tablet       . docusate sodium (COLACE) 100 MG capsule Take 100 mg by mouth as needed.        . isometheptene-acetaminophen-dichloralphenazone (MIDRIN) 65-325-100 MG capsule       . Multiple Vitamin (MULTIVITAMIN) capsule Take 1 capsule by mouth daily.        . propranolol (INDERAL) 60 MG tablet Take 60 mg by mouth 2 (two) times daily.        [name] triamterene-hydrochlorothiazide (DYAZIDE) 37.5-25 MG per capsule Take 1 capsule by mouth every morning.        . AMBULATORY NON FORMULARY MEDICATION Medication Name: MAGIC MOUTHWASH 2 % Viscous Lidocaine  Maalox Benadryl Susp.  Disp: 1:1:1 287m bottle Sig: 159mPO Swish & Spit  q 3-4hrs. PRN mouth sores  200 mL  3   No current facility-administered medications for this visit.   Facility-Administered Medications Ordered in  Other Visits  Medication Dose Route Frequency Provider Last Rate Last Dose  . 0.9 %  sodium chloride infusion   Intravenous Once Deatra Robinson, MD      . cyclophosphamide (CYTOXAN) 1,100 mg in sodium chloride 0.9 % 250 mL chemo infusion  600 mg/m2 (Treatment Plan Actual) Intravenous Once Deatra Robinson, MD   1,100 mg at 12/27/10 1244  . DOXOrubicin (ADRIAMYCIN) chemo injection 110 mg  60 mg/m2 (Treatment Plan Actual) Intravenous Once Deatra Robinson, MD   110 mg at 12/27/10 1226  . fosaprepitant (EMEND) 150 mg in sodium chloride 0.9 %  145 mL IVPB  150 mg Intravenous Once Deatra Robinson, MD   150 mg at 12/27/10 1135  . heparin lock flush 100 unit/mL  500 Units Intracatheter Once PRN Deatra Robinson, MD   500 Units at 12/27/10 1320  . pegfilgrastim (NEULASTA) injection 6 mg  6 mg Subcutaneous Once Deatra Robinson, MD   6 mg at 12/28/10 1019  . DISCONTD: sodium chloride 0.9 % injection 10 mL  10 mL Intracatheter PRN Deatra Robinson, MD   10 mL at 12/27/10 1320  . DISCONTD: sodium chloride 0.9 % injection 3 mL  3 mL Intravenous PRN Deatra Robinson, MD        SURGICAL HISTORY:  Past Surgical History  Procedure Date  . Abdominal hysterectomy   . Bladder tuck   . 2 orbital fracture surgeries   . Sinus exploration   . Bladder suspension     done 2005  . Mastectomy modified radical 10/23/2010    Left Dr Margot Chimes  . Portacath placement 11/28/2010    via right subclavian - Dr Margot Chimes    REVIEW OF SYSTEMS:  Pertinent items are noted in HPI.   PHYSICAL EXAMINATION: General appearance: alert, cooperative and appears stated age Head: Normocephalic, without obvious abnormality, atraumatic Neck: no adenopathy, no carotid bruit, no JVD, supple, symmetrical, trachea midline and thyroid not enlarged, symmetric, no tenderness/mass/nodules Lymph nodes: Cervical, supraclavicular, and axillary nodes normal. Resp: clear to auscultation bilaterally and normal percussion bilaterally Back: symmetric, no curvature. ROM normal. No CVA tenderness. Cardio: regular rate and rhythm, S1, S2 normal, no murmur, click, rub or gallop GI: soft, non-tender; bowel sounds normal; no masses,  no organomegaly Extremities: extremities normal, atraumatic, no cyanosis or edema Neurologic: Alert and oriented X 3, normal strength and tone. Normal symmetric reflexes. Normal coordination and gait Left mastectomy scar is well healed there is no nodularity no skin changes. Right breast without any masses nipple discharge inversion or skin changes. ECOG PERFORMANCE  STATUS: 1 - Symptomatic but completely ambulatory  Blood pressure 114/71, pulse 53, temperature 98.4 F (36.9 C), temperature source Oral, height 5' 8"  (3.299 m), weight 157 lb 6.4 oz (71.396 kg).  LABORATORY DATA: Lab Results  Component Value Date   WBC 7.6 12/27/2010   HGB 12.0 12/27/2010   HCT 37.0 12/27/2010   MCV 93.7 12/27/2010   PLT 304 12/27/2010      Chemistry      Component Value Date/Time   NA 139 12/27/2010 1052   NA 139 12/27/2010 1052   K 4.1 12/27/2010 1052   K 4.1 12/27/2010 1052   CL 101 12/27/2010 1052   CL 101 12/27/2010 1052   CO2 28 12/27/2010 1052   CO2 28 12/27/2010 1052   BUN 14 12/27/2010 1052   BUN 14 12/27/2010 1052   CREATININE 0.72 12/27/2010 1052   CREATININE 0.72 12/27/2010 1052  Component Value Date/Time   CALCIUM 9.2 12/27/2010 1052   CALCIUM 9.2 12/27/2010 1052   ALKPHOS 78 12/06/2010 1135   AST 13 12/06/2010 1135   ALT 10 12/06/2010 1135   BILITOT 0.7 12/06/2010 1135       RADIOGRAPHIC STUDIES:  Dg Chest Portable 1 View  11/28/2010  *RADIOLOGY REPORT*  Clinical Data: Port-A-Cath placement.  PORTABLE CHEST - 1 VIEW  Comparison: 10/18/2010  Findings: Right Port-A-Cath is in place with the tip in the lower SVC.  No pneumothorax.  Lungs are clear.  Heart is upper limits normal in size.  No effusions.  IMPRESSION: Port-A-Cath tip in the lower SVC.  No pneumothorax.  Original Report Authenticated By: Raelyn Number, M.D.   Dg C-arm 1-60 Min-no Report  11/28/2010  CLINICAL DATA: porta cath   C-ARM 1-60 MINUTES  Fluoroscopy was utilized by the requesting physician.  No radiographic  interpretation.      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 Patient is status post mastectomy of the left breast with axillary lymph node dissection on 10/23/2010. The final pathology did reveal a 5.0 cm invasive ductal carcinoma with 6 lymph nodes  that were positive for metastatic disease. Tumor was ER positive PR positive HER-2/neu negative with a proliferation marker of 12%.  #2 patient is now receiving adjuvant chemotherapy. She is getting Adriamycin and Cytoxan every 2 weeks with day 2 Neulasta. Thus far she has received a total of 3 cycles. She will finish Adriamycin and Cytoxan on 01/10/2011.  #3 thereafter she will begin weekly Taxol starting on 01/31/2011.   PLAN:   #1 overall patient is doing well she had day 2 Neulasta per done to do injection. She is tolerating it quite well without any problems.  #2 oral mucositis has resolved. 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  #3 patient is encouraged to keep her appointments with me t #4 patient's chemotherapy orders have been placed in the computer.   All questions were answered. The patient knows to call the clinic with any problems, questions or concerns. We can certainly see the patient much sooner if necessary.  I spent 30 minutes counseling the patient face to face. The total time spent in the appointment was 40 minutes.    Marcy Panning, MD Medical/Oncology [facility] [phone] (beeper) [phone] (Office)  12/28/2010, 11:37 AM

## 2010-12-28 NOTE — Telephone Encounter (Signed)
made patient appointments for the new 12 weeks printed out and gave to the patient

## 2011-01-01 ENCOUNTER — Other Ambulatory Visit: Payer: Self-pay | Admitting: Oncology

## 2011-01-01 ENCOUNTER — Telehealth: Payer: Self-pay | Admitting: *Deleted

## 2011-01-01 NOTE — Telephone Encounter (Signed)
patient confirmed over the phone for the appointment on 01-03-2011 starting at 11:45am

## 2011-01-03 ENCOUNTER — Other Ambulatory Visit (HOSPITAL_BASED_OUTPATIENT_CLINIC_OR_DEPARTMENT_OTHER): Payer: Medicare Other | Admitting: Lab

## 2011-01-03 ENCOUNTER — Ambulatory Visit (HOSPITAL_BASED_OUTPATIENT_CLINIC_OR_DEPARTMENT_OTHER): Payer: Medicare Other | Admitting: Physician Assistant

## 2011-01-03 ENCOUNTER — Ambulatory Visit: Payer: Medicare Other

## 2011-01-03 DIAGNOSIS — Z09 Encounter for follow-up examination after completed treatment for conditions other than malignant neoplasm: Secondary | ICD-10-CM

## 2011-01-03 DIAGNOSIS — C50919 Malignant neoplasm of unspecified site of unspecified female breast: Secondary | ICD-10-CM

## 2011-01-03 DIAGNOSIS — R197 Diarrhea, unspecified: Secondary | ICD-10-CM

## 2011-01-03 DIAGNOSIS — C50419 Malignant neoplasm of upper-outer quadrant of unspecified female breast: Secondary | ICD-10-CM

## 2011-01-03 DIAGNOSIS — D709 Neutropenia, unspecified: Secondary | ICD-10-CM

## 2011-01-03 DIAGNOSIS — Z170 Estrogen receptor positive status [ER+]: Secondary | ICD-10-CM

## 2011-01-03 LAB — CBC WITH DIFFERENTIAL/PLATELET
Basophils Absolute: 0 10*3/uL (ref 0.0–0.1)
EOS%: 1.7 % (ref 0.0–7.0)
Eosinophils Absolute: 0 10*3/uL (ref 0.0–0.5)
HGB: 10.8 g/dL — ABNORMAL LOW (ref 11.6–15.9)
NEUT#: 0.6 10*3/uL — ABNORMAL LOW (ref 1.5–6.5)
RDW: 13.2 % (ref 11.2–14.5)
lymph#: 0.6 10*3/uL — ABNORMAL LOW (ref 0.9–3.3)

## 2011-01-03 LAB — BASIC METABOLIC PANEL
Calcium: 9.2 mg/dL (ref 8.4–10.5)
Chloride: 100 mEq/L (ref 96–112)
Creatinine, Ser: 0.72 mg/dL (ref 0.50–1.10)

## 2011-01-03 NOTE — Progress Notes (Signed)
Progress note dictated-CTS

## 2011-01-03 NOTE — Progress Notes (Signed)
CC:   Carola J. Jonny Ruiz, M.D.  DIAGNOSIS:  A 50 year old woman with a clinical stage IIIA (T2 N2) invasive lobular carcinoma of the left breast with associated LCIS, status post a left modified radical mastectomy with axillary node dissection with pathology revealing a 5-cm invasive ductal carcinoma with lobular features, 6 nodes positive for metastatic disease, ER positive, ER PR positive, and HER2-negative with a proliferation marker of 12%.  CURRENT THERAPY:  Day 7 cycle 3 of 4 planned adjuvant dose-dense Adriamycin/Cytoxan with Neulasta support on day 2.  SUBJECTIVE:  Ms. Brutus is seen today for followup after her 3rd of 4 planned adjuvant dose-dense Adriamycin/Cytoxan for her history of stage IIIA left breast carcinoma.  She actually feels pretty well overall, though she did develop diarrhea this morning, she states she has not taken any Imodium but she does have some at home which she will be taking. She also states that she is staying well-hydrated.  She denies any fevers, chills, shortness of breath, or chest pain.  Again, no nausea symptoms.  No diffuse bony pain, despite use of Neulasta.  No bleeding or bruising symptoms.  ALLERGIES:  No known drug allergies.  CURRENT MEDICATIONS:  Reviewed with the patient and as per EMR.  ECOG status of 1.  OBJECTIVE/PHYSICAL EXAM:  Vital signs:  Blood pressure is 112/68, pulse 54, respirations 20, temp 98.8, and weight 151 pounds. HEENT: Conjunctivae pink.  Sclerae anicteric.  Oropharynx is benign.  No evidence of oral mucositis or candidiasis.  Lungs are clear to auscultation without wheezing or rhonchi.  Heart:  Regular rate and rhythm without murmurs, rubs, gallops, or clicks.  Abdomen:  Soft, nontender, without organomegaly.  Normal bowel sounds.  Extremities are benign.  Neurologic exam is nonfocal.  LABORATORY DATA:  Hemoglobin 10.8 g, platelet count 148,000, and WBC 1300 with an ANC of 600.  IMPRESSION: 36. A  50 year old woman with a history of a stage IIIA (T2 N2) left in     infiltrating lobular carcinoma of the left breast with associated     lobular carcinoma in situ and evidence of 4 positive nodes, status     post left modified radical mastectomy with axillary node     dissection, currently day 7, cycle 3 of 4 planned adjuvant dose-     dense AC for her ER/PR positive, HER2-negative process. 2. Afebrile neutropenia. 3. Diarrhea symptoms as described for today. Case has been reviewed     with Dr. Humphrey Rolls.  PLAN:  Ms. Cressy will use Imodium as prescribed for over the counter directions.  She will push fluids.  If her symptoms do not improve or they should worsen by tomorrow she will contact our office for definitive therapy. From our standpoint, we will see her back in 1 week's time prior to day 1 cycle 4 of her adjuvant therapy.  She knows to contact us sooner if the need should arise.    ______________________________ Honor Junes, PA CS/MEDQ  D:  01/03/2011  T:  01/03/2011  Job:  [id number]

## 2011-01-05 ENCOUNTER — Telehealth: Payer: Self-pay | Admitting: Oncology

## 2011-01-05 NOTE — Telephone Encounter (Signed)
per MD called pts home lmovm that her labd and md appt was moved to 01/30/2011 and she is to keep her tx on 01/31/2011 @ 11am.  asked pt to rtn call to confirm changes

## 2011-01-10 ENCOUNTER — Ambulatory Visit (HOSPITAL_BASED_OUTPATIENT_CLINIC_OR_DEPARTMENT_OTHER): Payer: Medicare Other | Admitting: Oncology

## 2011-01-10 ENCOUNTER — Telehealth: Payer: Self-pay | Admitting: Oncology

## 2011-01-10 ENCOUNTER — Other Ambulatory Visit (HOSPITAL_BASED_OUTPATIENT_CLINIC_OR_DEPARTMENT_OTHER): Payer: Medicare Other | Admitting: Lab

## 2011-01-10 ENCOUNTER — Ambulatory Visit (HOSPITAL_BASED_OUTPATIENT_CLINIC_OR_DEPARTMENT_OTHER): Payer: Medicare Other

## 2011-01-10 VITALS — BP 122/76 | HR 52 | Temp 97.7°F | Ht 68.0 in | Wt 155.7 lb

## 2011-01-10 DIAGNOSIS — C50419 Malignant neoplasm of upper-outer quadrant of unspecified female breast: Secondary | ICD-10-CM

## 2011-01-10 DIAGNOSIS — C50919 Malignant neoplasm of unspecified site of unspecified female breast: Secondary | ICD-10-CM

## 2011-01-10 DIAGNOSIS — Z5111 Encounter for antineoplastic chemotherapy: Secondary | ICD-10-CM

## 2011-01-10 DIAGNOSIS — Z170 Estrogen receptor positive status [ER+]: Secondary | ICD-10-CM

## 2011-01-10 LAB — COMPREHENSIVE METABOLIC PANEL
ALT: <8 U/L (ref 0–35)
Albumin: 4.4 g/dL (ref 3.5–5.2)
CO2: 29 mEq/L (ref 19–32)
Glucose, Bld: 76 mg/dL (ref 70–99)
Potassium: 3.9 mEq/L (ref 3.5–5.3)
Sodium: 139 mEq/L (ref 135–145)
Total Protein: 6.8 g/dL (ref 6.0–8.3)

## 2011-01-10 LAB — CBC WITH DIFFERENTIAL/PLATELET
Basophils Absolute: 0.1 10*3/uL (ref 0.0–0.1)
Eosinophils Absolute: 0 10*3/uL (ref 0.0–0.5)
HGB: 11.7 g/dL (ref 11.6–15.9)
MONO#: 0.7 10*3/uL (ref 0.1–0.9)
NEUT#: 5 10*3/uL (ref 1.5–6.5)
RDW: 14.2 % (ref 11.2–14.5)
WBC: 7.4 10*3/uL (ref 3.9–10.3)
lymph#: 1.6 10*3/uL (ref 0.9–3.3)
nRBC: 0 % (ref 0–0)

## 2011-01-10 MED ORDER — PALONOSETRON HCL INJECTION 0.25 MG/5ML
0.2500 mg | Freq: Once | INTRAVENOUS | Status: AC
  Administered 2011-01-10: 0.25 mg via INTRAVENOUS

## 2011-01-10 MED ORDER — DEXAMETHASONE 4 MG PO TABS: ORAL_TABLET | ORAL | Status: DC

## 2011-01-10 MED ORDER — SODIUM CHLORIDE 0.9 % IV SOLN
600.0000 mg/m2 | Freq: Once | INTRAVENOUS | Status: AC
  Administered 2011-01-10: 1100 mg via INTRAVENOUS
  Filled 2011-01-10: qty 55

## 2011-01-10 MED ORDER — DOXORUBICIN HCL CHEMO IV INJECTION 2 MG/ML
60.0000 mg/m2 | Freq: Once | INTRAVENOUS | Status: AC
  Administered 2011-01-10: 110 mg via INTRAVENOUS
  Filled 2011-01-10: qty 55

## 2011-01-10 MED ORDER — SODIUM CHLORIDE 0.9 % IV SOLN: Freq: Once | INTRAVENOUS | Status: DC

## 2011-01-10 MED ORDER — DEXAMETHASONE SODIUM PHOSPHATE 4 MG/ML IJ SOLN
12.0000 mg | Freq: Once | INTRAMUSCULAR | Status: AC
  Administered 2011-01-10: 12 mg via INTRAVENOUS

## 2011-01-10 MED ORDER — SODIUM CHLORIDE 0.9 % IV SOLN
150.0000 mg | Freq: Once | INTRAVENOUS | Status: AC
  Administered 2011-01-10: 150 mg via INTRAVENOUS
  Filled 2011-01-10: qty 5

## 2011-01-10 NOTE — Progress Notes (Signed)
OFFICE PROGRESS NOTE  CC   Vanessa Shouts, MD, MD [address], [place] [address]  DIAGNOSIS: 50 year old female with stage IIIa (T2 N2) invasive lobular carcinoma of the left breast with associated LCIS status post left modified radical mastectomy with axillary lymph node dissection with a polyp pathology revealing a 5 cm invasive ductal carcinoma with lobular features 6 nodes were positive for metastatic disease tumor was ER positive PR positive HER-2/neu negative with a proliferation marker of 12%.  PRIOR THERAPY:  #1 status post mastectomy with axillary lymph node dissection for a stage IIIa invasive lobular carcinoma measuring 6 x 8 cm with 6 positive lymph nodes.  #2 patient is receiving adjuvant chemotherapy consisting of Adriamycin and Cytoxan given dose dense every 2 weeks she will complete cycle #4 today  #3 she will then begin weekly Taxol starting on 01/31/2011 for a total of 12 weeks.  CURRENT THERAPY: Patient is here for cycle 4 day 1 of Adriamycin and Cytoxan with day 2 Neulasta to be given on 01/11/2011.  INTERVAL HISTORY: Vanessa Mills 50 y.o. female returns for followup visit today. Overall she seems to be doing well she has tolerated the Adriamycin and Cytoxan remarkably well. She is denying any fevers chills night sweats headaches shortness of breath chest pains palpitations she has no myalgias or arthralgias her CBC looks terrific. She denies any shortness of breath she has no hematuria hematochezia no breast tenderness no chest wall tenderness. Remainder of the 10 point review of systems is negative.  MEDICAL HISTORY: Past Medical History  Diagnosis Date  . Chronic mental illness   . Meniere's syndrome   . Crohn's disease   . PONV (postoperative nausea and vomiting)   . Raynaud's disease   . GERD (gastroesophageal reflux disease)     does not take medications for   . Headache     takes  midodrine for migraines prn  . Breast cancer, ILC, Left, receptor+, Her2- 10/10/2010  . Mental disorder     bipolar, takes saphris at hs  . Chemotherapy follow-up examination 12/28/2010  . Fatigue 12/28/2010    ALLERGIES:   has no known allergies.  MEDICATIONS:  Current Outpatient Prescriptions  Medication Sig Dispense Refill  . ALPRAZolam (XANAX) 1 MG tablet Take 1 mg by mouth daily as needed.        . AMBULATORY NON FORMULARY MEDICATION Medication Name: MAGIC MOUTHWASH 2 % Viscous Lidocaine  Maalox Benadryl Susp.  Disp: 1:1:1 219m bottle Sig: 110mPO Swish & Spit  q 3-4hrs. PRN mouth sores  200 mL  3  . asenapine (SAPHRIS) 5 MG SUBL Place 10 mg under the tongue.        . Marland Kitchenexamethasone (DECADRON) 4 MG tablet Take 5 pills 12 hours and 6 hours prior to chemotherapy (Taxol)  25 tablet  1  . diazepam (VALIUM) 10 MG tablet       . docusate sodium (COLACE) 100 MG capsule Take 100 mg by mouth as needed.        . isometheptene-acetaminophen-dichloralphenazone (MIDRIN) 65-325-100 MG capsule       . Multiple Vitamin (MULTIVITAMIN) capsule Take 1 capsule by mouth daily.        . propranolol (INDERAL) 60 MG tablet Take 60 mg by mouth 2 (two) times daily.        . Marland Kitchenriamterene-hydrochlorothiazide (DYAZIDE) 37.5-25 MG per capsule Take 1 capsule by mouth every morning.  SURGICAL HISTORY:  Past Surgical History  Procedure Date  . Abdominal hysterectomy   . Bladder tuck   . 2 orbital fracture surgeries   . Sinus exploration   . Bladder suspension     done 2005  . Mastectomy modified radical 10/23/2010    Left Dr Margot Chimes  . Portacath placement 11/28/2010    via right subclavian - Dr Margot Chimes    REVIEW OF SYSTEMS:  A comprehensive review of systems was negative.   PHYSICAL EXAMINATION: General appearance: alert, cooperative and appears stated age Head: Normocephalic, without obvious abnormality, atraumatic Neck: no adenopathy, no carotid bruit, no JVD, supple, symmetrical, trachea  midline and thyroid not enlarged, symmetric, no tenderness/mass/nodules Lymph nodes: Cervical, supraclavicular, and axillary nodes normal. Resp: clear to auscultation bilaterally and normal percussion bilaterally Back: symmetric, no curvature. ROM normal. No CVA tenderness. Cardio: regular rate and rhythm, S1, S2 normal, no murmur, click, rub or gallop and normal apical impulse GI: soft, non-tender; bowel sounds normal; no masses,  no organomegaly Extremities: extremities normal, atraumatic, no cyanosis or edema Neurologic: Alert and oriented X 3, normal strength and tone. Normal symmetric reflexes. Normal coordination and gait Right breast is examined today she is noted to have a palpable mass without any nipple inversion or retraction. ECOG PERFORMANCE STATUS: 1 - Symptomatic but completely ambulatory  Blood pressure 122/76, pulse 52, temperature 97.7 F (36.5 C), height 5' 8"  (1.727 m), weight 155 lb 11.2 oz (70.625 kg).  LABORATORY DATA: Lab Results  Component Value Date   WBC 7.4 01/10/2011   HGB 11.7 01/10/2011   HCT 34.7* 01/10/2011   MCV 92.5 01/10/2011   PLT 272 01/10/2011      Chemistry      Component Value Date/Time   NA 139 01/03/2011 1205   K 3.9 01/03/2011 1205   CL 100 01/03/2011 1205   CO2 31 01/03/2011 1205   BUN 17 01/03/2011 1205   CREATININE 0.72 01/03/2011 1205      Component Value Date/Time   CALCIUM 9.2 01/03/2011 1205   ALKPHOS 78 12/06/2010 1135   AST 13 12/06/2010 1135   ALT 10 12/06/2010 1135   BILITOT 0.7 12/06/2010 1135       RADIOGRAPHIC STUDIES:  No results found.  ASSESSMENT: 50 year old female with stage III breast cancer s/p mastectomy of the left breast which showed a 5 cm mass with 6 positive lymph nodes lobular features. She was now on adjuvant chemotherapy consisting of Adriamycin and Cytoxan she will proceed with cycle #4 of dose dense a.c.   PLAN: Patient will be seen back in one week's time for interim labs. Then she will get 2 weeks  off and we will restart her treatment starting on 01/31/2011. She will see me the day before her scheduled Taxol.   All questions were answered. The patient knows to call the clinic with any problems, questions or concerns. We can certainly see the patient much sooner if necessary.  I spent 20 minutes counseling the patient face to face. The total time spent in the appointment was 30 minutes.    Marcy Panning, MD Medical/Oncology [facility] [phone] (beeper) [phone] (Office)  01/10/2011, 12:36 PM

## 2011-01-10 NOTE — Telephone Encounter (Signed)
Gv pt appt for [id number]

## 2011-01-10 NOTE — Telephone Encounter (Signed)
Scheduled pt for mammogram on 12/17 @ 2pm @ [facility]

## 2011-01-11 ENCOUNTER — Ambulatory Visit (HOSPITAL_BASED_OUTPATIENT_CLINIC_OR_DEPARTMENT_OTHER): Payer: Medicare Other

## 2011-01-11 VITALS — BP 115/68 | HR 57 | Temp 98.9°F

## 2011-01-11 DIAGNOSIS — C50419 Malignant neoplasm of upper-outer quadrant of unspecified female breast: Secondary | ICD-10-CM

## 2011-01-11 MED ORDER — PEGFILGRASTIM INJECTION 6 MG/0.6ML
6.0000 mg | Freq: Once | SUBCUTANEOUS | Status: AC
  Administered 2011-01-11: 6 mg via SUBCUTANEOUS
  Filled 2011-01-11: qty 0.6

## 2011-01-15 ENCOUNTER — Encounter: Payer: Self-pay | Admitting: Radiation Oncology

## 2011-01-17 ENCOUNTER — Other Ambulatory Visit (HOSPITAL_BASED_OUTPATIENT_CLINIC_OR_DEPARTMENT_OTHER): Payer: Medicare Other | Admitting: Lab

## 2011-01-17 ENCOUNTER — Ambulatory Visit (HOSPITAL_BASED_OUTPATIENT_CLINIC_OR_DEPARTMENT_OTHER): Payer: Medicare Other | Admitting: Physician Assistant

## 2011-01-17 DIAGNOSIS — C50919 Malignant neoplasm of unspecified site of unspecified female breast: Secondary | ICD-10-CM

## 2011-01-17 DIAGNOSIS — Z09 Encounter for follow-up examination after completed treatment for conditions other than malignant neoplasm: Secondary | ICD-10-CM

## 2011-01-17 LAB — CBC WITH DIFFERENTIAL/PLATELET
BASO%: 1.2 % (ref 0.0–2.0)
Basophils Absolute: 0 10*3/uL (ref 0.0–0.1)
EOS%: 1.7 % (ref 0.0–7.0)
HCT: 29.4 % — ABNORMAL LOW (ref 34.8–46.6)
LYMPH%: 38.3 % (ref 14.0–49.7)
MCH: 32 pg (ref 25.1–34.0)
MCHC: 34.4 g/dL (ref 31.5–36.0)
MCV: 93.2 fL (ref 79.5–101.0)
MONO%: 2.4 % (ref 0.0–14.0)
NEUT%: 56.4 % (ref 38.4–76.8)
lymph#: 0.5 10*3/uL — ABNORMAL LOW (ref 0.9–3.3)

## 2011-01-17 LAB — BASIC METABOLIC PANEL
BUN: 14 mg/dL (ref 6–23)
Calcium: 9.5 mg/dL (ref 8.4–10.5)
Creatinine, Ser: 0.64 mg/dL (ref 0.50–1.10)

## 2011-01-17 NOTE — Progress Notes (Signed)
Progress note dictated-CTS

## 2011-01-17 NOTE — Progress Notes (Signed)
CC:   Vanessa Mills, M.D.  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  CURRENT THERAPY:  Day 7, cycle 4 of 4 planned adjuvant dose-dense AC with Neulasta support on day 2.  SUBJECTIVE:  Vanessa Mills is seen today for followup after her 4th cycle of adjuvant dose-dense AC.  She denies any complaints whatsoever.  She does receive Neulasta support on day 2.  She denies any fevers, chills, night sweats, bone pain, shortness of breath, chest pain, nausea, emesis, diarrhea, or constipation issues.  She has had no metallic taste.  She denies any mouth sores this go round.  REVIEW OF SYSTEMS:  Negative.  ALLERGIES:  No known drug allergies.  CURRENT MEDICATIONS:  As per EMR.  PERFORMANCE STATUS:  ECOG status of 1.  PHYSICAL EXAMINATION:  Vital Signs:  Blood pressure is 97/65.  Pulse 60. Respirations 20.  Temp 98.  Weight 155 pounds.  HEENT:  Conjunctivae are pink.  Sclerae are anicteric.  Oropharynx is benign without oral mucositis or candidosis.  Lungs:  Clear to auscultation without wheezing or rhonchi.  Heart:  Regular rate and rhythm without murmurs, rubs, gallops, or clicks.  Abdomen:  Soft and nontender without organomegaly. Normal bowel sounds.  Extremities:  Free of pedal edema.  No PPE changes.  Neurologic Exam:  Nonfocal.  The patient is alert and orient x3.  LABORATORY DATA:  Hemoglobin 10.1 g, platelet count 158,000, WBC 1,200 with an ANC of 700.  IMPRESSION: 60. A 50 year old woman with a history of a stage III, T2 N2 left     breast carcinoma; specifically, an infiltrating ductal  carcinoma     with lobular features with lobular carcinoma in situ and evidence     of 6 positive nodes.  She underwent a left modified radical     mastectomy with axillary lymph node dissection.  She is currently     day 7, cycle 4 of 4 planned adjuvant dose-dense AC. 2. Afebrile neutropenia. The case has been reviewed Dr. Chancy Milroy.  PLAN:  We will not place her on antibiotics since she gets into trouble with quite a bit of diarrhea following antibiotic coverage.  Therefore, she we will have the next 2 weeks off prior to initiating her 1st of 12 planned weekly doses of Taxol.  Of note, the patient has also be given instructions on premedication with dexamethasone in anticipation of her 1st Taxol dosing.  She knows to contact us prior to her 2 week followup if the need should arise.    ______________________________ Vanessa Junes, PA CS/MEDQ  D:  01/17/2011  T:  01/17/2011  Job:  [id number]

## 2011-01-30 ENCOUNTER — Other Ambulatory Visit: Payer: Self-pay | Admitting: Oncology

## 2011-01-30 ENCOUNTER — Ambulatory Visit (HOSPITAL_BASED_OUTPATIENT_CLINIC_OR_DEPARTMENT_OTHER): Payer: Medicare Other | Admitting: Family

## 2011-01-30 ENCOUNTER — Other Ambulatory Visit: Payer: Medicare Other | Admitting: Lab

## 2011-01-30 ENCOUNTER — Encounter: Payer: Self-pay | Admitting: Family

## 2011-01-30 VITALS — BP 118/75 | HR 59 | Temp 98.4°F | Ht 68.0 in | Wt 157.5 lb

## 2011-01-30 DIAGNOSIS — C50919 Malignant neoplasm of unspecified site of unspecified female breast: Secondary | ICD-10-CM

## 2011-01-30 DIAGNOSIS — F411 Generalized anxiety disorder: Secondary | ICD-10-CM

## 2011-01-30 DIAGNOSIS — F3110 Bipolar disorder, current episode manic without psychotic features, unspecified: Secondary | ICD-10-CM | POA: Insufficient documentation

## 2011-01-30 DIAGNOSIS — F19982 Other psychoactive substance use, unspecified with psychoactive substance-induced sleep disorder: Secondary | ICD-10-CM

## 2011-01-30 DIAGNOSIS — F319 Bipolar disorder, unspecified: Secondary | ICD-10-CM

## 2011-01-30 LAB — CBC WITH DIFFERENTIAL/PLATELET
Eosinophils Absolute: 0 10*3/uL (ref 0.0–0.5)
HCT: 35.9 % (ref 34.8–46.6)
LYMPH%: 26.2 % (ref 14.0–49.7)
MCV: 94.7 fL (ref 79.5–101.0)
MONO#: 0.4 10*3/uL (ref 0.1–0.9)
MONO%: 15.4 % — ABNORMAL HIGH (ref 0.0–14.0)
NEUT#: 1.7 10*3/uL (ref 1.5–6.5)
NEUT%: 57.9 % (ref 38.4–76.8)
Platelets: 423 10*3/uL — ABNORMAL HIGH (ref 145–400)
WBC: 2.9 10*3/uL — ABNORMAL LOW (ref 3.9–10.3)

## 2011-01-30 LAB — COMPREHENSIVE METABOLIC PANEL
Alkaline Phosphatase: 60 U/L (ref 39–117)
BUN: 12 mg/dL (ref 6–23)
CO2: 28 mEq/L (ref 19–32)
Creatinine, Ser: 0.77 mg/dL (ref 0.50–1.10)
Glucose, Bld: 62 mg/dL — ABNORMAL LOW (ref 70–99)
Total Bilirubin: 0.3 mg/dL (ref 0.3–1.2)

## 2011-01-30 MED ORDER — ZOLPIDEM TARTRATE 10 MG PO TABS: 10.0000 mg | ORAL_TABLET | Freq: Every evening | ORAL | Status: DC | PRN

## 2011-01-30 NOTE — Progress Notes (Signed)
[place]  Name: Vanessa Mills                  DATE: 01/30/2011 MRN: [id number]                      DOB: May 17, 1960  CC: [name], MD         [name], MD.   REFERRING PHYSICIAN: [name], MD  DIAGNOSIS: Patient Active Problem List  Diagnoses Date Noted  . Bipolar disorder 01/30/2011    Priority: Medium  . Chemotherapy follow-up examination 12/28/2010  . Fatigue 12/28/2010  . Breast cancer, ILC, Left, receptor+, Her2- 10/10/2010     Encounter Diagnoses  Name Primary?  . 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 Breast cancer   . Bipolar disorder     CURRENT THERAPY:Completed dose dense Adriamycin, Cytoxan every 14 days for 4 cycles Now beginning weekly Taxol X 12.   INTERIM HISTORY: Stage IIIA (T2 N2) invasive lobular carcinoma of the left breast with associated LCIS, status post left modified radical mastectomy with axillary lymph node dissection, pathology revealed a 5 cm invasive ductal carcinoma with lobular features, 6 nodes positive for metastatic disease. ER positive PR positive HER-2/neu negative with a proliferation marker 12%.    Tolerated dose dense Adriamycin/Cytoxan every 2 weeks for 4 cycles well. Chief complaint during that therapy was stomatitis, used Magic Mouthwash with good results. None currently. Also experienced fatigue, self-limited.  Accompanied by her husband today, worried and anxious about dose scheduling of the Decadron prior to Taxol treatments. Chemotherapy is scheduled for 11:00 AM tomorrow, and she is concerned about taking a dose of Decadron at 11:00 PM this evening, and again at 5:00 AM as prescribed. Has a diagnosis of bipolar disorder, states it is extremely important that she get uninterrupted sleep. Has taken Ambien with bipolar medications in the past for insomnia.  PHYSICAL EXAM: BP 118/75  Pulse 59  Temp(Src) 98.4 F (36.9 C) (Oral)  Ht 5' 8"  (1.727 m)  Wt 157 lb 8 oz (71.442 kg)  BMI 23.95  kg/m2 General: Well developed, well nourished, white female in no acute distress. Accompanied by her husband. EENT: No ocular or oral lesions. No stomatitis.  Respiratory: Lungs are clear to auscultation bilaterally with normal respiratory movement and no accessory muscle use. Cardiac: No murmur, rub or tachycardia. No upper or lower extremity edema or tenderness.  GI: Abdomen is soft, no palpable hepatosplenomegaly. No fluid wave. No tenderness. Musculoskeletal: No kyphosis, no tenderness over the spine, ribs or hips. Lymph: No cervical, infraclavicular, axillary or inguinal adenopathy. Neuro: No focal neurological deficits. Psych: Alert and oriented X 3, mildly anxious, appropriate affect, although somewhat flat.   SOCIAL HISTORY:   Social History  . Marital Status: Married   Social History Main Topics  . Smoking status: Never Smoker   . Alcohol Use: 1.7 oz/week    1 Drinks containing 0.5 oz of alcohol, 2 Glasses of wine per week  . Drug Use: Yes    Special: Marijuana     in college   Social History Narrative   Married, One dtr- 51 years old- [name]- in [place]:   Results for orders placed in visit on 01/30/11  CBC WITH DIFFERENTIAL      Component Value Range   WBC 2.9 (*) 3.9 - 10.3 (10e3/uL)   NEUT# 1.7  1.5 - 6.5 (10e3/uL)   HGB 12.3  11.6 - 15.9 (g/dL)  HCT 35.9  34.8 - 46.6 (%)   Platelets 423 (*) 145 - 400 (10e3/uL)   MCV 94.7  79.5 - 101.0 (fL)   MCH 32.3  25.1 - 34.0 (pg)   MCHC 34.2  31.5 - 36.0 (g/dL)   RBC 3.79  3.70 - 5.45 (10e6/uL)   RDW 15.8 (*) 11.2 - 14.5 (%)   lymph# 0.8 (*) 0.9 - 3.3 (10e3/uL)   MONO# 0.4  0.1 - 0.9 (10e3/uL)   Eosinophils Absolute 0.0  0.0 - 0.5 (10e3/uL)   Basophils Absolute 0.0  0.0 - 0.1 (10e3/uL)   NEUT% 57.9  38.4 - 76.8 (%)   LYMPH% 26.2  14.0 - 49.7 (%)   MONO% 15.4 (*) 0.0 - 14.0 (%)   EOS% 0.2  0.0 - 7.0 (%)   BASO% 0.3  0.0 - 2.0 (%)  COMPREHENSIVE METABOLIC PANEL      Component Value  Range   Sodium 140  135 - 145 (mEq/L)   Potassium 3.8  3.5 - 5.3 (mEq/L)   Chloride 100  96 - 112 (mEq/L)   CO2 28  19 - 32 (mEq/L)   Glucose, Bld 62 (*) 70 - 99 (mg/dL)   BUN 12  6 - 23 (mg/dL)   Creatinine, Ser 0.77  0.50 - 1.10 (mg/dL)   Total Bilirubin 0.3  0.3 - 1.2 (mg/dL)   Alkaline Phosphatase 60  39 - 117 (U/L)   AST 16  0 - 37 (U/L)   ALT 11  0 - 35 (U/L)   Total Protein 6.9  6.0 - 8.3 (g/dL)   Albumin 4.9  3.5 - 5.2 (g/dL)   Calcium 9.7  8.4 - 10.5 (mg/dL)    IMPRESSION:  51 year old white female with: 1. Stage IIIA invasive lobular carcinoma, left breast. Status post left mastectomy, completed 4 cycles of dose dense Adriamycin/Cytoxan with good tolerance. 2. Fatigue, self-limited, related to chemotherapy. 3. Diagnosis of bipolar disorder, with associated anxiety related to dosing of pre-chemotherapy Decadron.  PLAN:   1. Amend dosing schedule of Decadron, she'll take 1 dose morning prior to the day of chemotherapy. We can add additional dose as needed IV with chemotherapy. 2. Prescription for Ambien 10 mg by mouth at hs as needed for sleep. 3. Begin weekly Taxol treatment tomorrow, this will be cycle 1 of 12 planned.  DISCUSSION: We discussed the importance of Decadron when beginning Taxol treatments but I let her know we are happy to work with her on dosing to affect the minimum amount of disruption to her delicate mental health balance as possible.

## 2011-01-31 ENCOUNTER — Other Ambulatory Visit: Payer: Self-pay | Admitting: Oncology

## 2011-01-31 ENCOUNTER — Ambulatory Visit (HOSPITAL_BASED_OUTPATIENT_CLINIC_OR_DEPARTMENT_OTHER): Payer: Medicare Other

## 2011-01-31 ENCOUNTER — Other Ambulatory Visit: Payer: Medicare Other | Admitting: Lab

## 2011-01-31 ENCOUNTER — Ambulatory Visit: Payer: Medicare Other | Admitting: Oncology

## 2011-01-31 VITALS — BP 105/65 | HR 61 | Temp 98.7°F

## 2011-01-31 DIAGNOSIS — Z5111 Encounter for antineoplastic chemotherapy: Secondary | ICD-10-CM

## 2011-01-31 DIAGNOSIS — C50419 Malignant neoplasm of upper-outer quadrant of unspecified female breast: Secondary | ICD-10-CM

## 2011-01-31 DIAGNOSIS — C779 Secondary and unspecified malignant neoplasm of lymph node, unspecified: Secondary | ICD-10-CM

## 2011-01-31 DIAGNOSIS — C50919 Malignant neoplasm of unspecified site of unspecified female breast: Secondary | ICD-10-CM

## 2011-01-31 MED ORDER — ONDANSETRON 8 MG/50ML IVPB (CHCC)
8.0000 mg | Freq: Once | INTRAVENOUS | Status: AC
  Administered 2011-01-31: 8 mg via INTRAVENOUS

## 2011-01-31 MED ORDER — HEPARIN SOD (PORK) LOCK FLUSH 100 UNIT/ML IV SOLN
500.0000 [IU] | Freq: Once | INTRAVENOUS | Status: AC | PRN
  Administered 2011-01-31: 500 [IU]
  Filled 2011-01-31: qty 5

## 2011-01-31 MED ORDER — SODIUM CHLORIDE 0.9 % IJ SOLN
10.0000 mL | INTRAMUSCULAR | Status: DC | PRN
  Administered 2011-01-31: 10 mL
  Filled 2011-01-31: qty 10

## 2011-01-31 MED ORDER — SODIUM CHLORIDE 0.9 % IV SOLN
Freq: Once | INTRAVENOUS | Status: AC
  Administered 2011-01-31: 12:00:00 via INTRAVENOUS

## 2011-01-31 MED ORDER — DIPHENHYDRAMINE HCL 50 MG/ML IJ SOLN
50.0000 mg | Freq: Once | INTRAMUSCULAR | Status: AC
  Administered 2011-01-31: 50 mg via INTRAVENOUS

## 2011-01-31 MED ORDER — DEXAMETHASONE SODIUM PHOSPHATE 4 MG/ML IJ SOLN
20.0000 mg | Freq: Once | INTRAMUSCULAR | Status: AC
  Administered 2011-01-31: 20 mg via INTRAVENOUS

## 2011-01-31 MED ORDER — FAMOTIDINE IN NACL 20-0.9 MG/50ML-% IV SOLN: 20.0000 mg | Freq: Once | INTRAVENOUS | Status: DC

## 2011-01-31 MED ORDER — PACLITAXEL CHEMO INJECTION 300 MG/50ML
80.0000 mg/m2 | Freq: Once | INTRAVENOUS | Status: AC
  Administered 2011-01-31: 150 mg via INTRAVENOUS
  Filled 2011-01-31: qty 25

## 2011-01-31 NOTE — Progress Notes (Signed)
TAXOL at 1255, VSS,denies distress. Infusion running at 63 mls per hour.

## 2011-01-31 NOTE — Progress Notes (Signed)
At 1245, TAXOL started at 63 mls per hour for a volume od 16 mls. Pt instructed to inform nurse at once if having difficulty breathing, chest pain, or sudden chills.

## 2011-01-31 NOTE — Progress Notes (Signed)
TAXOL At 1325, VSS,denies distress.  Infusion increased to  Run At 188 mls per hour for  47 mls. Denies distress.

## 2011-01-31 NOTE — Progress Notes (Signed)
At 1425, VSS,denies distress, infusion completed.

## 2011-01-31 NOTE — Progress Notes (Signed)
At 1305, VSS,denies distress. Infusion increased to flow at 125 mls per hour for 31 mls.

## 2011-01-31 NOTE — Progress Notes (Signed)
At 1340, VSS,denies distress. Infusion increased to flow at 250 mls for 63 mls.

## 2011-01-31 NOTE — Progress Notes (Signed)
TAXOL At 1300, VSS,denies distress, infsuing at 63 mls per hour.

## 2011-02-01 ENCOUNTER — Telehealth: Payer: Self-pay | Admitting: *Deleted

## 2011-02-01 NOTE — Telephone Encounter (Signed)
No complaints from 1st Taxol. Bowels moving, no nausea, eating well, No mouth sores or pain. She will call for any problems.

## 2011-02-04 ENCOUNTER — Encounter (INDEPENDENT_AMBULATORY_CARE_PROVIDER_SITE_OTHER): Payer: Self-pay | Admitting: Surgery

## 2011-02-07 ENCOUNTER — Other Ambulatory Visit: Payer: Medicare Other | Admitting: Lab

## 2011-02-07 ENCOUNTER — Ambulatory Visit: Payer: Medicare Other | Admitting: Oncology

## 2011-02-07 ENCOUNTER — Ambulatory Visit (HOSPITAL_BASED_OUTPATIENT_CLINIC_OR_DEPARTMENT_OTHER): Payer: Medicare Other

## 2011-02-07 VITALS — BP 138/93 | HR 57 | Temp 98.6°F | Ht 68.0 in | Wt 156.2 lb

## 2011-02-07 DIAGNOSIS — C50919 Malignant neoplasm of unspecified site of unspecified female breast: Secondary | ICD-10-CM

## 2011-02-07 DIAGNOSIS — C50419 Malignant neoplasm of upper-outer quadrant of unspecified female breast: Secondary | ICD-10-CM

## 2011-02-07 LAB — CBC WITH DIFFERENTIAL/PLATELET
Basophils Absolute: 0.1 10*3/uL (ref 0.0–0.1)
Eosinophils Absolute: 0 10*3/uL (ref 0.0–0.5)
HGB: 11.3 g/dL — ABNORMAL LOW (ref 11.6–15.9)
LYMPH%: 17.7 % (ref 14.0–49.7)
MCV: 94.4 fL (ref 79.5–101.0)
MONO#: 0.7 10*3/uL (ref 0.1–0.9)
NEUT#: 5.6 10*3/uL (ref 1.5–6.5)
Platelets: 472 10*3/uL — ABNORMAL HIGH (ref 145–400)
RBC: 3.59 10*6/uL — ABNORMAL LOW (ref 3.70–5.45)
WBC: 7.8 10*3/uL (ref 3.9–10.3)
nRBC: 0 % (ref 0–0)

## 2011-02-07 LAB — COMPREHENSIVE METABOLIC PANEL
ALT: 13 U/L (ref 0–35)
CO2: 28 mEq/L (ref 19–32)
Calcium: 9.9 mg/dL (ref 8.4–10.5)
Chloride: 100 mEq/L (ref 96–112)
Creatinine, Ser: 0.66 mg/dL (ref 0.50–1.10)
Glucose, Bld: 106 mg/dL — ABNORMAL HIGH (ref 70–99)
Sodium: 139 mEq/L (ref 135–145)
Total Protein: 6.7 g/dL (ref 6.0–8.3)

## 2011-02-07 MED ORDER — HEPARIN SOD (PORK) LOCK FLUSH 100 UNIT/ML IV SOLN
500.0000 [IU] | Freq: Once | INTRAVENOUS | Status: AC | PRN
  Administered 2011-02-07: 500 [IU]
  Filled 2011-02-07: qty 5

## 2011-02-07 MED ORDER — FAMOTIDINE IN NACL 20-0.9 MG/50ML-% IV SOLN
20.0000 mg | Freq: Once | INTRAVENOUS | Status: AC
Start: 2011-02-07 — End: 2011-02-07
  Administered 2011-02-07: 20 mg via INTRAVENOUS

## 2011-02-07 MED ORDER — DEXAMETHASONE SODIUM PHOSPHATE 4 MG/ML IJ SOLN
20.0000 mg | Freq: Once | INTRAMUSCULAR | Status: AC
  Administered 2011-02-07: 20 mg via INTRAVENOUS

## 2011-02-07 MED ORDER — SODIUM CHLORIDE 0.9 % IV SOLN
Freq: Once | INTRAVENOUS | Status: AC
  Administered 2011-02-07: 11:00:00 via INTRAVENOUS

## 2011-02-07 MED ORDER — ONDANSETRON 8 MG/50ML IVPB (CHCC)
8.0000 mg | Freq: Once | INTRAVENOUS | Status: AC
  Administered 2011-02-07: 8 mg via INTRAVENOUS

## 2011-02-07 MED ORDER — SODIUM CHLORIDE 0.9 % IJ SOLN
10.0000 mL | INTRAMUSCULAR | Status: DC | PRN
  Administered 2011-02-07: 10 mL
  Filled 2011-02-07: qty 10

## 2011-02-07 MED ORDER — PACLITAXEL CHEMO INJECTION 300 MG/50ML
80.0000 mg/m2 | Freq: Once | INTRAVENOUS | Status: AC
  Administered 2011-02-07: 150 mg via INTRAVENOUS
  Filled 2011-02-07: qty 25

## 2011-02-07 MED ORDER — DIPHENHYDRAMINE HCL 50 MG/ML IJ SOLN
50.0000 mg | Freq: Once | INTRAMUSCULAR | Status: AC
  Administered 2011-02-07: 50 mg via INTRAVENOUS

## 2011-02-07 NOTE — Progress Notes (Signed)
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  PRIOR THERAPY:  #1 status post mastectomy with axillary lymph node dissection for a stage IIIa invasive lobular carcinoma measuring 6 x 8 cm with 6 positive lymph nodes.  #2 patient is receiving adjuvant chemotherapy consisting of Adriamycin and Cytoxan given dose dense every 2 weeks she will complete cycle #4 today  #3 she will then begin weekly Taxol starting on 01/31/2011 for a total of 12 weeks.  CURRENT THERAPY: patient is here for week 2 of 12 Taxol  INTERVAL HISTORY: Vanessa Mills 51 y.o. female returns for followup visit today. Overall she is doing well she is tolerating the Taxol quite nicely after having received one week of the. We did have to reduce the amount is dear was that she is getting especially as a premedication. She received 20 mg of the dexamethasone yesterday and she'll receive 20 today. With her next cycle she will not take any all Decadron. Otherwise she has no fevers chills night sweats headaches shortness of breath chest pains palpitations she is denying having any peripheral paresthesias she has no myalgias or arthralgias. She seems to be in excellent mood and spirits. She has excellent support at home from her husband family and friends. Remainder of the 10 point review of systems is negative.  MEDICAL HISTORY: Past Medical History  Diagnosis Date  . Chronic mental illness   . Meniere's syndrome   . Crohn's disease   . PONV  (postoperative nausea and vomiting)   . Raynaud's disease   . GERD (gastroesophageal reflux disease)     does not take medications for   . Headache     takes midodrine for migraines prn  . Breast cancer, ILC, Left, receptor+, Her2- 10/10/2010  . Mental disorder     bipolar, takes saphris at hs  . Chemotherapy follow-up examination 12/28/2010  . Fatigue 12/28/2010    ALLERGIES:   has no known allergies.  MEDICATIONS:  Current Outpatient Prescriptions  Medication Sig Dispense Refill  . ALPRAZolam (XANAX) 1 MG tablet Take 1 mg by mouth daily as needed.        . AMBULATORY NON FORMULARY MEDICATION Medication Name: MAGIC MOUTHWASH 2 % Viscous Lidocaine  Maalox Benadryl Susp.  Disp: 1:1:1 233m bottle Sig: 187mPO Swish & Spit  q 3-4hrs. PRN mouth sores  200 mL  3  . asenapine (SAPHRIS) 5 MG SUBL Place 10 mg under the tongue.        . Marland Kitchenexamethasone (DECADRON) 4 MG tablet Take 5 pills 12 hours and 6 hours prior to chemotherapy (Taxol)  25 tablet  1  . diazepam (VALIUM) 10 MG tablet       . docusate sodium (COLACE) 100 MG capsule Take 100 mg by mouth as needed.        . isometheptene-acetaminophen-dichloralphenazone (MIDRIN) 65-325-100 MG capsule       . Multiple Vitamin (MULTIVITAMIN) capsule Take 1 capsule by mouth daily.        . propranolol (INDERAL) 60 MG tablet Take 60 mg by mouth 2 (  two) times daily.        [name] triamterene-hydrochlorothiazide (DYAZIDE) 37.5-25 MG per capsule Take 1 capsule by mouth every morning.        . zolpidem (AMBIEN) 10 MG tablet Take 1 tablet (10 mg total) by mouth at bedtime as needed.  30 tablet  0   No current facility-administered medications for this visit.   Facility-Administered Medications Ordered in Other Visits  Medication Dose Route Frequency Provider Last Rate Last Dose  . 0.9 %  sodium chloride infusion   Intravenous Once Deatra Robinson, MD      . dexamethasone (DECADRON) injection 20 mg  20 mg Intravenous Once Deatra Robinson, MD   20 mg at  02/07/11 1129  . diphenhydrAMINE (BENADRYL) injection 50 mg  50 mg Intravenous Once Deatra Robinson, MD   50 mg at 02/07/11 1129  . famotidine (PEPCID) IVPB 20 mg  20 mg Intravenous Once Deatra Robinson, MD   20 mg at 02/07/11 1147  . heparin lock flush 100 unit/mL  500 Units Intracatheter Once PRN Deatra Robinson, MD   500 Units at 02/07/11 1326  . ondansetron (ZOFRAN) IVPB 8 mg  8 mg Intravenous Once Deatra Robinson, MD   8 mg at 02/07/11 1129  . PACLitaxel (TAXOL) 150 mg in dextrose 5 % 250 mL chemo infusion (</= 71m/m2)  80 mg/m2 (Treatment Plan Actual) Intravenous Once KDeatra Robinson MD   150 mg at 02/07/11 1207  . sodium chloride 0.9 % injection 10 mL  10 mL Intracatheter PRN KDeatra Robinson MD   10 mL at 02/07/11 1326    SURGICAL HISTORY:  Past Surgical History  Procedure Date  . Abdominal hysterectomy   . Bladder tuck   . 2 orbital fracture surgeries   . Sinus exploration   . Bladder suspension     done 2005  . Mastectomy modified radical 10/23/2010    Left Dr SMargot Chimes . Portacath placement 11/28/2010    via right subclavian - Dr SMargot Chimes   REVIEW OF SYSTEMS:  A comprehensive review of systems was negative.   PHYSICAL EXAMINATION: General appearance: alert, cooperative and appears stated age Head: Normocephalic, without obvious abnormality, atraumatic Neck: no adenopathy, no carotid bruit, no JVD, supple, symmetrical, trachea midline and thyroid not enlarged, symmetric, no tenderness/mass/nodules Lymph nodes: Cervical, supraclavicular, and axillary nodes normal. Resp: clear to auscultation bilaterally and normal percussion bilaterally Back: symmetric, no curvature. ROM normal. No CVA tenderness. Cardio: regular rate and rhythm, S1, S2 normal, no murmur, click, rub or gallop and normal apical impulse GI: soft, non-tender; bowel sounds normal; no masses,  no organomegaly Extremities: extremities normal, atraumatic, no cyanosis or edema Neurologic: Alert and oriented X 3,  normal strength and tone. Normal symmetric reflexes. Normal coordination and gait Right breast is examined today she is noted to have a palpable mass without any nipple inversion or retraction. ECOG PERFORMANCE STATUS: 1 - Symptomatic but completely ambulatory  Blood pressure 138/93, pulse 57, temperature 98.6 F (37 C), temperature source Oral, height 5' 8"  (1.727 m), weight 156 lb 3.2 oz (70.852 kg).  LABORATORY DATA: Lab Results  Component Value Date   WBC 7.8 02/07/2011   HGB 11.3* 02/07/2011   HCT 33.9* 02/07/2011   MCV 94.4 02/07/2011   PLT 472* 02/07/2011      Chemistry      Component Value Date/Time   NA 140 01/30/2011 1059   K 3.8 01/30/2011 1059   CL 100 01/30/2011 1059  CO2 28 01/30/2011 1059   BUN 12 01/30/2011 1059   CREATININE 0.77 01/30/2011 1059      Component Value Date/Time   CALCIUM 9.7 01/30/2011 1059   ALKPHOS 60 01/30/2011 1059   AST 16 01/30/2011 1059   ALT 11 01/30/2011 1059   BILITOT 0.3 01/30/2011 1059       RADIOGRAPHIC STUDIES:  No results found.  ASSESSMENT: 51 year old female with stage III breast cancer s/p mastectomy of the left breast which showed a 5 cm mass with 6 positive lymph nodes lobular features. Patient is status post 4 cycles of dose dense Adriamycin and Cytoxan which she tolerated very well. She is now receiving weekly Taxol. She will proceed with week #2 of 12 planned Taxol.  PLAN: patient will continue to see [place] on a weekly basis during the duration of her taxing therapy. She knows to call us with any problems questions or concerns. She will not take anymore premedication with Decadron at home. Her Decadron will be given as an IV prior to her getting the Taxol. Hopefully this will help with her insomnia and jitteriness.  All questions were answered. The patient knows to call the clinic with any problems, questions or concerns. We can certainly see the patient much sooner if necessary.  I spent 20 minutes counseling the patient face to face. The total  time spent in the appointment was 30 minutes.    Marcy Panning, MD Medical/Oncology [facility] [phone] (beeper) [phone] (Office)  02/07/2011, 2:10 PM

## 2011-02-14 ENCOUNTER — Other Ambulatory Visit (HOSPITAL_BASED_OUTPATIENT_CLINIC_OR_DEPARTMENT_OTHER): Payer: Medicare Other | Admitting: Lab

## 2011-02-14 ENCOUNTER — Ambulatory Visit (HOSPITAL_BASED_OUTPATIENT_CLINIC_OR_DEPARTMENT_OTHER): Payer: Medicare Other | Admitting: Oncology

## 2011-02-14 ENCOUNTER — Ambulatory Visit (HOSPITAL_BASED_OUTPATIENT_CLINIC_OR_DEPARTMENT_OTHER): Payer: Medicare Other

## 2011-02-14 VITALS — BP 115/72 | HR 49 | Temp 98.2°F | Ht 68.0 in | Wt 159.0 lb

## 2011-02-14 DIAGNOSIS — C50919 Malignant neoplasm of unspecified site of unspecified female breast: Secondary | ICD-10-CM

## 2011-02-14 DIAGNOSIS — Z5111 Encounter for antineoplastic chemotherapy: Secondary | ICD-10-CM

## 2011-02-14 DIAGNOSIS — Z09 Encounter for follow-up examination after completed treatment for conditions other than malignant neoplasm: Secondary | ICD-10-CM

## 2011-02-14 DIAGNOSIS — C50419 Malignant neoplasm of upper-outer quadrant of unspecified female breast: Secondary | ICD-10-CM

## 2011-02-14 DIAGNOSIS — C773 Secondary and unspecified malignant neoplasm of axilla and upper limb lymph nodes: Secondary | ICD-10-CM

## 2011-02-14 DIAGNOSIS — Z170 Estrogen receptor positive status [ER+]: Secondary | ICD-10-CM

## 2011-02-14 DIAGNOSIS — Z9010 Acquired absence of unspecified breast and nipple: Secondary | ICD-10-CM

## 2011-02-14 LAB — CBC WITH DIFFERENTIAL/PLATELET
Basophils Absolute: 0 10*3/uL (ref 0.0–0.1)
EOS%: 3.3 % (ref 0.0–7.0)
HCT: 34.8 % (ref 34.8–46.6)
HGB: 12 g/dL (ref 11.6–15.9)
MCH: 33.1 pg (ref 25.1–34.0)
MCV: 95.8 fL (ref 79.5–101.0)
MONO%: 12.2 % (ref 0.0–14.0)
NEUT%: 55 % (ref 38.4–76.8)

## 2011-02-14 LAB — COMPREHENSIVE METABOLIC PANEL
ALT: 16 U/L (ref 0–35)
AST: 17 U/L (ref 0–37)
Albumin: 4.3 g/dL (ref 3.5–5.2)
Alkaline Phosphatase: 48 U/L (ref 39–117)
BUN: 12 mg/dL (ref 6–23)
CO2: 29 mEq/L (ref 19–32)
Calcium: 9.3 mg/dL (ref 8.4–10.5)
Chloride: 103 mEq/L (ref 96–112)
Creatinine, Ser: 0.72 mg/dL (ref 0.50–1.10)
Glucose, Bld: 80 mg/dL (ref 70–99)
Potassium: 4.1 mEq/L (ref 3.5–5.3)
Sodium: 141 mEq/L (ref 135–145)
Total Bilirubin: 0.3 mg/dL (ref 0.3–1.2)
Total Protein: 6.6 g/dL (ref 6.0–8.3)

## 2011-02-14 MED ORDER — FAMOTIDINE IN NACL 20-0.9 MG/50ML-% IV SOLN
20.0000 mg | Freq: Once | INTRAVENOUS | Status: AC
  Administered 2011-02-14: 20 mg via INTRAVENOUS

## 2011-02-14 MED ORDER — DIPHENHYDRAMINE HCL 50 MG/ML IJ SOLN
50.0000 mg | Freq: Once | INTRAMUSCULAR | Status: AC
  Administered 2011-02-14: 50 mg via INTRAVENOUS

## 2011-02-14 MED ORDER — PACLITAXEL CHEMO INJECTION 300 MG/50ML
80.0000 mg/m2 | Freq: Once | INTRAVENOUS | Status: AC
  Administered 2011-02-14: 150 mg via INTRAVENOUS
  Filled 2011-02-14: qty 25

## 2011-02-14 MED ORDER — ONDANSETRON 8 MG/50ML IVPB (CHCC)
8.0000 mg | Freq: Once | INTRAVENOUS | Status: AC
  Administered 2011-02-14: 8 mg via INTRAVENOUS

## 2011-02-14 MED ORDER — SODIUM CHLORIDE 0.9 % IV SOLN
Freq: Once | INTRAVENOUS | Status: AC
  Administered 2011-02-14: 13:00:00 via INTRAVENOUS

## 2011-02-14 MED ORDER — SODIUM CHLORIDE 0.9 % IJ SOLN
10.0000 mL | INTRAMUSCULAR | Status: DC | PRN
  Administered 2011-02-14: 10 mL
  Filled 2011-02-14: qty 10

## 2011-02-14 MED ORDER — DEXAMETHASONE SODIUM PHOSPHATE 4 MG/ML IJ SOLN
12.0000 mg | Freq: Once | INTRAMUSCULAR | Status: AC
  Administered 2011-02-14: 12 mg via INTRAVENOUS

## 2011-02-14 MED ORDER — HEPARIN SOD (PORK) LOCK FLUSH 100 UNIT/ML IV SOLN
500.0000 [IU] | Freq: Once | INTRAVENOUS | Status: AC | PRN
  Administered 2011-02-14: 500 [IU]
  Filled 2011-02-14: qty 5

## 2011-02-18 NOTE — Progress Notes (Signed)
OFFICE PROGRESS NOTE  CC   [name], MD, MD [place] [address]  DIAGNOSIS: 51 year old female with stage IIIa (T2 N2) invasive lobular carcinoma of the left breast with associated LCIS status post left modified radical mastectomy with axillary lymph node dissection with a polyp pathology revealing a 5 cm invasive ductal carcinoma with lobular features 6 nodes were positive for metastatic disease tumor was ER positive PR positive HER-2/neu negative with a proliferation marker of 12%.  PRIOR THERAPY:  #1 status post mastectomy with axillary lymph node dissection for a stage IIIa invasive lobular carcinoma measuring 6 x 8 cm with 6 positive lymph nodes.  #2 patient is receiving adjuvant chemotherapy consisting of Adriamycin and Cytoxan given dose dense every 2 weeks she will complete cycle #4 today  #3 she will then begin weekly Taxol starting on 01/31/2011 for a total of 12 weeks.  CURRENT THERAPY: patient is here for week 3 of 12 Taxol  INTERVAL HISTORY: Vanessa Mills 51 y.o. female returns for followup visit today. Overall she is doing well she is tolerating the Taxol quite nicely after having received one week of the. We did have to reduce the amount is dear was that she is getting especially as a premedication. She received 20 mg of the dexamethasone yesterday and she'll receive 20 today. With her next cycle she will not take any all Decadron. Otherwise she has no fevers chills night sweats headaches shortness of breath chest pains palpitations she is denying having any peripheral paresthesias she has no myalgias or arthralgias. She seems to be in excellent mood and spirits. She has excellent support at home from her husband family and friends. Remainder of the 10 point review of systems is negative.  MEDICAL HISTORY: Past Medical History  Diagnosis Date  . Chronic mental illness   . Meniere's syndrome   . Crohn's disease   . PONV  (postoperative nausea and vomiting)   . Raynaud's disease   . GERD (gastroesophageal reflux disease)     does not take medications for   . Headache     takes midodrine for migraines prn  . Breast cancer, ILC, Left, receptor+, Her2- 10/10/2010  . Mental disorder     bipolar, takes saphris at hs  . Chemotherapy follow-up examination 12/28/2010  . Fatigue 12/28/2010    ALLERGIES:   has no known allergies.  MEDICATIONS:  Current Outpatient Prescriptions  Medication Sig Dispense Refill  . ALPRAZolam (XANAX) 1 MG tablet Take 1 mg by mouth daily as needed.        . AMBULATORY NON FORMULARY MEDICATION Medication Name: MAGIC MOUTHWASH 2 % Viscous Lidocaine  Maalox Benadryl Susp.  Disp: 1:1:1 269m bottle Sig: 180mPO Swish & Spit  q 3-4hrs. PRN mouth sores  200 mL  3  . asenapine (SAPHRIS) 5 MG SUBL Place 10 mg under the tongue at bedtime.       . diazepam (VALIUM) 10 MG tablet as needed.       . docusate sodium (COLACE) 100 MG capsule Take 100 mg by mouth as needed.        . isometheptene-acetaminophen-dichloralphenazone (MIDRIN) 65-325-100 MG capsule as needed.       . Multiple Vitamin (MULTIVITAMIN) capsule Take 1 capsule by mouth daily.        . propranolol (INDERAL) 60 MG tablet Take 60 mg by mouth 2 (two) times daily.        . Marland Kitchenriamterene-hydrochlorothiazide (DYAZIDE) 37.5-25 MG per capsule Take 1  capsule by mouth every morning.        . zolpidem (AMBIEN) 10 MG tablet Take 1 tablet (10 mg total) by mouth at bedtime as needed.  30 tablet  0    SURGICAL HISTORY:  Past Surgical History  Procedure Date  . Abdominal hysterectomy   . Bladder tuck   . 2 orbital fracture surgeries   . Sinus exploration   . Bladder suspension     done 2005  . Mastectomy modified radical 10/23/2010    Left Dr Margot Chimes  . Portacath placement 11/28/2010    via right subclavian - Dr Margot Chimes    REVIEW OF SYSTEMS:  A comprehensive review of systems was negative.   PHYSICAL EXAMINATION: General  appearance: alert, cooperative and appears stated age Head: Normocephalic, without obvious abnormality, atraumatic Neck: no adenopathy, no carotid bruit, no JVD, supple, symmetrical, trachea midline and thyroid not enlarged, symmetric, no tenderness/mass/nodules Lymph nodes: Cervical, supraclavicular, and axillary nodes normal. Resp: clear to auscultation bilaterally and normal percussion bilaterally Back: symmetric, no curvature. ROM normal. No CVA tenderness. Cardio: regular rate and rhythm, S1, S2 normal, no murmur, click, rub or gallop and normal apical impulse GI: soft, non-tender; bowel sounds normal; no masses,  no organomegaly Extremities: extremities normal, atraumatic, no cyanosis or edema Neurologic: Alert and oriented X 3, normal strength and tone. Normal symmetric reflexes. Normal coordination and gait Right breast is examined today she is noted to have a palpable mass without any nipple inversion or retraction. ECOG PERFORMANCE STATUS: 1 - Symptomatic but completely ambulatory  Blood pressure 115/72, pulse 49, temperature 98.2 F (36.8 C), temperature source Oral, height 5' 8"  (1.727 m), weight 159 lb (72.122 kg).  LABORATORY DATA: Lab Results  Component Value Date   WBC 2.7* 02/14/2011   HGB 12.0 02/14/2011   HCT 34.8 02/14/2011   MCV 95.8 02/14/2011   PLT 289 02/14/2011      Chemistry      Component Value Date/Time   NA 141 02/14/2011 1041   K 4.1 02/14/2011 1041   CL 103 02/14/2011 1041   CO2 29 02/14/2011 1041   BUN 12 02/14/2011 1041   CREATININE 0.72 02/14/2011 1041      Component Value Date/Time   CALCIUM 9.3 02/14/2011 1041   ALKPHOS 48 02/14/2011 1041   AST 17 02/14/2011 1041   ALT 16 02/14/2011 1041   BILITOT 0.3 02/14/2011 1041       RADIOGRAPHIC STUDIES:  No results found.  ASSESSMENT: 51 year old female with stage III breast cancer s/p mastectomy of the left breast which showed a 5 cm mass with 6 positive lymph nodes lobular features. Patient is status  post 4 cycles of dose dense Adriamycin and Cytoxan which she tolerated very well. She is now receiving weekly Taxol. She will proceed with week #3 of 12 planned Taxol.  PLAN: patient will continue to see [place] on a weekly basis during the duration of her taxane therapy. She knows to call us with any problems questions or concerns. She will not take anymore premedication with Decadron at home. Her Decadron will be given as an IV prior to her getting the Taxol we will reduce the steroids to 12 mg iv. Hopefully this will help with her insomnia and jitteriness.  All questions were answered. The patient knows to call the clinic with any problems, questions or concerns. We can certainly see the patient much sooner if necessary.  I spent 20 minutes counseling the patient face to face. The total  time spent in the appointment was 30 minutes.    Marcy Panning, MD Medical/Oncology [facility] [phone] (beeper) [phone] (Office)  02/18/2011, 11:20 PM

## 2011-02-21 ENCOUNTER — Ambulatory Visit (HOSPITAL_BASED_OUTPATIENT_CLINIC_OR_DEPARTMENT_OTHER): Payer: Medicare Other

## 2011-02-21 ENCOUNTER — Ambulatory Visit: Payer: Medicare Other | Admitting: Physician Assistant

## 2011-02-21 ENCOUNTER — Encounter: Payer: Self-pay | Admitting: Family

## 2011-02-21 ENCOUNTER — Ambulatory Visit: Payer: Medicare Other

## 2011-02-21 ENCOUNTER — Other Ambulatory Visit: Payer: Medicare Other | Admitting: Lab

## 2011-02-21 ENCOUNTER — Ambulatory Visit (HOSPITAL_BASED_OUTPATIENT_CLINIC_OR_DEPARTMENT_OTHER): Payer: Medicare Other | Admitting: Family

## 2011-02-21 VITALS — BP 123/80 | HR 52 | Temp 98.8°F | Ht 68.0 in | Wt 158.3 lb

## 2011-02-21 DIAGNOSIS — C50419 Malignant neoplasm of upper-outer quadrant of unspecified female breast: Secondary | ICD-10-CM

## 2011-02-21 DIAGNOSIS — C50919 Malignant neoplasm of unspecified site of unspecified female breast: Secondary | ICD-10-CM

## 2011-02-21 DIAGNOSIS — C50912 Malignant neoplasm of unspecified site of left female breast: Secondary | ICD-10-CM

## 2011-02-21 DIAGNOSIS — Z5111 Encounter for antineoplastic chemotherapy: Secondary | ICD-10-CM

## 2011-02-21 DIAGNOSIS — R5381 Other malaise: Secondary | ICD-10-CM

## 2011-02-21 LAB — COMPREHENSIVE METABOLIC PANEL
ALT: 18 U/L (ref 0–35)
AST: 20 U/L (ref 0–37)
Albumin: 4.4 g/dL (ref 3.5–5.2)
BUN: 13 mg/dL (ref 6–23)
CO2: 27 mEq/L (ref 19–32)
Calcium: 9.3 mg/dL (ref 8.4–10.5)
Chloride: 101 mEq/L (ref 96–112)
Potassium: 3.6 mEq/L (ref 3.5–5.3)

## 2011-02-21 LAB — CBC WITH DIFFERENTIAL/PLATELET
BASO%: 1.2 % (ref 0.0–2.0)
Eosinophils Absolute: 0.2 10*3/uL (ref 0.0–0.5)
HCT: 36.5 % (ref 34.8–46.6)
MCHC: 34 g/dL (ref 31.5–36.0)
MONO#: 0.5 10*3/uL (ref 0.1–0.9)
NEUT#: 5.1 10*3/uL (ref 1.5–6.5)
NEUT%: 75.4 % (ref 38.4–76.8)
WBC: 6.8 10*3/uL (ref 3.9–10.3)
lymph#: 0.9 10*3/uL (ref 0.9–3.3)
nRBC: 0 % (ref 0–0)

## 2011-02-21 MED ORDER — FAMOTIDINE IN NACL 20-0.9 MG/50ML-% IV SOLN
20.0000 mg | Freq: Once | INTRAVENOUS | Status: AC
  Administered 2011-02-21: 20 mg via INTRAVENOUS

## 2011-02-21 MED ORDER — ONDANSETRON 8 MG/50ML IVPB (CHCC)
8.0000 mg | Freq: Once | INTRAVENOUS | Status: AC
  Administered 2011-02-21: 8 mg via INTRAVENOUS

## 2011-02-21 MED ORDER — SODIUM CHLORIDE 0.9 % IJ SOLN
10.0000 mL | INTRAMUSCULAR | Status: DC | PRN
  Administered 2011-02-21: 10 mL
  Filled 2011-02-21: qty 10

## 2011-02-21 MED ORDER — HEPARIN SOD (PORK) LOCK FLUSH 100 UNIT/ML IV SOLN
500.0000 [IU] | Freq: Once | INTRAVENOUS | Status: AC | PRN
  Administered 2011-02-21: 500 [IU]
  Filled 2011-02-21: qty 5

## 2011-02-21 MED ORDER — DIPHENHYDRAMINE HCL 50 MG/ML IJ SOLN
50.0000 mg | Freq: Once | INTRAMUSCULAR | Status: AC
  Administered 2011-02-21: 50 mg via INTRAVENOUS

## 2011-02-21 MED ORDER — DEXAMETHASONE SODIUM PHOSPHATE 4 MG/ML IJ SOLN
12.0000 mg | Freq: Once | INTRAMUSCULAR | Status: AC
  Administered 2011-02-21: 12 mg via INTRAVENOUS

## 2011-02-21 MED ORDER — PACLITAXEL CHEMO INJECTION 300 MG/50ML
80.0000 mg/m2 | Freq: Once | INTRAVENOUS | Status: AC
  Administered 2011-02-21: 150 mg via INTRAVENOUS
  Filled 2011-02-21: qty 25

## 2011-02-21 MED ORDER — SODIUM CHLORIDE 0.9 % IV SOLN
Freq: Once | INTRAVENOUS | Status: AC
  Administered 2011-02-21: 10:00:00 via INTRAVENOUS

## 2011-02-21 NOTE — Progress Notes (Signed)
[place]  Name: Vanessa Mills                  DATE: 02/21/2011 MRN: [id number]                      DOB: Aug 17, 1960  CC: [name], MD         [name], MD.   REFERRING PHYSICIAN: [name], MD  DIAGNOSIS: Patient Active Problem List  Diagnoses Date Noted  . Bipolar disorder 01/30/2011    Priority: Medium  . Chemotherapy follow-up examination 12/28/2010  . Fatigue 12/28/2010  . Breast cancer, ILC, Left, receptor+, Her2- 10/10/2010     Encounter Diagnosis  Name Primary?  . Breast cancer, left breast Yes   PRIOR THERAPY:  Completed dose dense Adriamycin, Cytoxan every 14 days for 4 cycles.  CURRENT THERAPY: Weekly Taxol X 12, this will be week 4. (01/31/11 - 04/18/11).   INTERIM HISTORY: Stage IIIA (T2 N2) invasive lobular carcinoma of the left breast with associated LCIS, status post left modified radical mastectomy with axillary lymph node dissection, pathology revealed a 5 cm invasive ductal carcinoma with lobular features, 6 nodes positive for metastatic disease. ER positive PR positive HER-2/neu negative with a proliferation marker 12%.    Tolerated dose dense Adriamycin/Cytoxan every 2 weeks for 4 cycles well. Chief complaint during that therapy was stomatitis, used Magic Mouthwash with good results. None currently. Also experienced fatigue, self-limited.  Accompanied by her husband today, both are upbeat. They are leaving on Saturday for a trip to [facility] for the [place]. Has a diagnosis of bipolar disorder, states it is extremely important that she get uninterrupted sleep. No reaction to Taxol for the previous 3 cycles, we will discontinue Decadron pre-chemo. Dr. Humphrey Rolls reduced the dose of IV Decadron with chemo on her last visit.   No nausea or vomiting. Bowel and bladder function normal. Appetite is good. No headache or blurred vision, no cough or dyspnea, no abdominal pain, no new bone pain.   PHYSICAL EXAM: BP 123/80   Pulse 52  Temp(Src) 98.8 F (37.1 C) (Oral)  Ht 5' 8"  (1.727 m)  Wt 158 lb 4.8 oz (71.804 kg)  BMI 24.07 kg/m2 General: Well developed, well nourished, white female in no acute distress. Accompanied by her husband. EENT: No ocular or oral lesions. No stomatitis.  Respiratory: Lungs are clear to auscultation bilaterally with normal respiratory movement and no accessory muscle use. Cardiac: No murmur, rub or tachycardia. No upper or lower extremity edema or tenderness.  GI: Abdomen is soft, no palpable hepatosplenomegaly. No fluid wave. No tenderness. Musculoskeletal: No kyphosis, no tenderness over the spine, ribs or hips. Lymph: No cervical, infraclavicular, axillary or inguinal adenopathy. Neuro: No focal neurological deficits. Psych: Alert and oriented X 3, upbeat and animated.   SOCIAL HISTORY:   Social History  . Marital Status: Married   Social History Main Topics  . Smoking status: Never Smoker   . Alcohol Use: 1.7 oz/week    1 Drinks containing 0.5 oz of alcohol, 2 Glasses of wine per week  . Drug Use: Yes    Special: Marijuana     in college   Social History Narrative   Married, One dtr- 51 years old- [name]- in [place]:   Results for orders placed in visit on 02/21/11  CBC WITH DIFFERENTIAL      Component Value Range   WBC 6.8  3.9 -  10.3 (10e3/uL)   NEUT# 5.1  1.5 - 6.5 (10e3/uL)   HGB 12.4  11.6 - 15.9 (g/dL)   HCT 36.5  34.8 - 46.6 (%)   Platelets 240  145 - 400 (10e3/uL)   MCV 93.8  79.5 - 101.0 (fL)   MCH 31.9  25.1 - 34.0 (pg)   MCHC 34.0  31.5 - 36.0 (g/dL)   RBC 3.89  3.70 - 5.45 (10e6/uL)   RDW 14.2  11.2 - 14.5 (%)   lymph# 0.9  0.9 - 3.3 (10e3/uL)   MONO# 0.5  0.1 - 0.9 (10e3/uL)   Eosinophils Absolute 0.2  0.0 - 0.5 (10e3/uL)   Basophils Absolute 0.1  0.0 - 0.1 (10e3/uL)   NEUT% 75.4  38.4 - 76.8 (%)   LYMPH% 13.5 (*) 14.0 - 49.7 (%)   MONO% 7.4  0.0 - 14.0 (%)   EOS% 2.5  0.0 - 7.0 (%)   BASO% 1.2  0.0 - 2.0 (%)    nRBC 0  0 - 0 (%)    IMPRESSION:  51 year old white female with: 1. Stage IIIA invasive lobular carcinoma, left breast. Status post left mastectomy, completed 3 cycles of dose dense Adriamycin/Cytoxan with good tolerance. Now on cycle 4 of Taxol.  2. Fatigue, self-limited, related to chemotherapy. 3. Diagnosis of bipolar disorder, with associated anxiety related to dosing of pre-chemotherapy Decadron.  PLAN:   1. Discontinue PO Decadron.  2.Weekly Taxol treatment today, this will be cycle 4 of 12 planned.

## 2011-02-28 ENCOUNTER — Ambulatory Visit (HOSPITAL_BASED_OUTPATIENT_CLINIC_OR_DEPARTMENT_OTHER): Payer: Medicare Other | Admitting: Oncology

## 2011-02-28 ENCOUNTER — Ambulatory Visit (HOSPITAL_BASED_OUTPATIENT_CLINIC_OR_DEPARTMENT_OTHER): Payer: Medicare Other

## 2011-02-28 ENCOUNTER — Other Ambulatory Visit (HOSPITAL_BASED_OUTPATIENT_CLINIC_OR_DEPARTMENT_OTHER): Payer: Medicare Other | Admitting: Lab

## 2011-02-28 VITALS — BP 108/69 | HR 60 | Temp 97.7°F | Ht 68.0 in | Wt 158.4 lb

## 2011-02-28 DIAGNOSIS — Z170 Estrogen receptor positive status [ER+]: Secondary | ICD-10-CM

## 2011-02-28 DIAGNOSIS — Z09 Encounter for follow-up examination after completed treatment for conditions other than malignant neoplasm: Secondary | ICD-10-CM

## 2011-02-28 DIAGNOSIS — Z5111 Encounter for antineoplastic chemotherapy: Secondary | ICD-10-CM

## 2011-02-28 DIAGNOSIS — C50919 Malignant neoplasm of unspecified site of unspecified female breast: Secondary | ICD-10-CM

## 2011-02-28 DIAGNOSIS — C50419 Malignant neoplasm of upper-outer quadrant of unspecified female breast: Secondary | ICD-10-CM

## 2011-02-28 DIAGNOSIS — C773 Secondary and unspecified malignant neoplasm of axilla and upper limb lymph nodes: Secondary | ICD-10-CM

## 2011-02-28 LAB — CBC WITH DIFFERENTIAL/PLATELET
BASO%: 1.3 % (ref 0.0–2.0)
EOS%: 2.3 % (ref 0.0–7.0)
HCT: 37.5 % (ref 34.8–46.6)
LYMPH%: 24.4 % (ref 14.0–49.7)
MCH: 31.5 pg (ref 25.1–34.0)
MCHC: 33.6 g/dL (ref 31.5–36.0)
NEUT%: 67.1 % (ref 38.4–76.8)
Platelets: 283 10*3/uL (ref 145–400)
RBC: 4 10*6/uL (ref 3.70–5.45)
lymph#: 1 10*3/uL (ref 0.9–3.3)
nRBC: 0 % (ref 0–0)

## 2011-02-28 LAB — COMPREHENSIVE METABOLIC PANEL
AST: 20 U/L (ref 0–37)
Alkaline Phosphatase: 56 U/L (ref 39–117)
BUN: 13 mg/dL (ref 6–23)
Creatinine, Ser: 0.84 mg/dL (ref 0.50–1.10)
Total Bilirubin: 0.5 mg/dL (ref 0.3–1.2)

## 2011-02-28 MED ORDER — DIPHENHYDRAMINE HCL 50 MG/ML IJ SOLN: 25.0000 mg | Freq: Once | INTRAMUSCULAR | Status: DC | PRN

## 2011-02-28 MED ORDER — SODIUM CHLORIDE 0.9 % IJ SOLN
10.0000 mL | INTRAMUSCULAR | Status: DC | PRN
  Administered 2011-02-28: 10 mL
  Filled 2011-02-28: qty 10

## 2011-02-28 MED ORDER — DEXAMETHASONE SODIUM PHOSPHATE 4 MG/ML IJ SOLN
12.0000 mg | Freq: Once | INTRAMUSCULAR | Status: AC
  Administered 2011-02-28: 12 mg via INTRAVENOUS

## 2011-02-28 MED ORDER — ALBUTEROL SULFATE (2.5 MG/3ML) 0.083% IN NEBU
2.5000 mg | INHALATION_SOLUTION | Freq: Once | RESPIRATORY_TRACT | Status: DC | PRN
  Filled 2011-02-28: qty 3

## 2011-02-28 MED ORDER — SODIUM CHLORIDE 0.9 % IV SOLN: Freq: Once | INTRAVENOUS | Status: DC | PRN

## 2011-02-28 MED ORDER — DIPHENHYDRAMINE HCL 50 MG/ML IJ SOLN
50.0000 mg | Freq: Once | INTRAMUSCULAR | Status: AC
  Administered 2011-02-28: 50 mg via INTRAVENOUS

## 2011-02-28 MED ORDER — HEPARIN SOD (PORK) LOCK FLUSH 100 UNIT/ML IV SOLN
500.0000 [IU] | Freq: Once | INTRAVENOUS | Status: AC | PRN
  Administered 2011-02-28: 500 [IU]
  Filled 2011-02-28: qty 5

## 2011-02-28 MED ORDER — METHYLPREDNISOLONE SODIUM SUCC 125 MG IJ SOLR: 125.0000 mg | Freq: Once | INTRAMUSCULAR | Status: DC | PRN

## 2011-02-28 MED ORDER — DIPHENHYDRAMINE HCL 50 MG/ML IJ SOLN: 50.0000 mg | Freq: Once | INTRAMUSCULAR | Status: DC | PRN

## 2011-02-28 MED ORDER — EPINEPHRINE HCL 0.1 MG/ML IJ SOLN
0.2500 mg | Freq: Once | INTRAMUSCULAR | Status: DC | PRN
Start: 2011-02-28 — End: 2011-02-28
  Filled 2011-02-28: qty 10

## 2011-02-28 MED ORDER — PACLITAXEL CHEMO INJECTION 300 MG/50ML
80.0000 mg/m2 | Freq: Once | INTRAVENOUS | Status: AC
  Administered 2011-02-28: 150 mg via INTRAVENOUS
  Filled 2011-02-28: qty 25

## 2011-02-28 MED ORDER — SODIUM CHLORIDE 0.9 % IV SOLN
Freq: Once | INTRAVENOUS | Status: AC
  Administered 2011-02-28: 12:00:00 via INTRAVENOUS

## 2011-02-28 MED ORDER — FAMOTIDINE IN NACL 20-0.9 MG/50ML-% IV SOLN
20.0000 mg | Freq: Once | INTRAVENOUS | Status: AC
  Administered 2011-02-28: 20 mg via INTRAVENOUS

## 2011-02-28 MED ORDER — EPINEPHRINE HCL 0.1 MG/ML IJ SOLN
0.2500 mg | Freq: Once | INTRAMUSCULAR | Status: DC | PRN
  Filled 2011-02-28: qty 10

## 2011-02-28 MED ORDER — ONDANSETRON 8 MG/50ML IVPB (CHCC)
8.0000 mg | Freq: Once | INTRAVENOUS | Status: AC
  Administered 2011-02-28: 8 mg via INTRAVENOUS

## 2011-02-28 NOTE — Patient Instructions (Signed)
Pt tolerated chemo without problems.  Chemo discharge instructions given to pt.   Pt  Aware of next return appt to the clinic.   Pt was discharged home with friend via ambulation.

## 2011-03-03 NOTE — Progress Notes (Signed)
OFFICE PROGRESS NOTE  CC  [name], MD  [name], MD, MD [name] [address]  DIAGNOSIS: 51 year old female with:  1.  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  PRIOR THERAPY:  #1 status post mastectomy with axillary lymph node dissection for a stage IIIa invasive lobular carcinoma measuring 6 x 8 cm with 6 positive lymph nodes.  #2 patient is receiving adjuvant chemotherapy consisting of Adriamycin and Cytoxan given dose dense every 2 weeks she will complete cycle #4 today  #3 she will then begin weekly Taxol starting on 01/31/2011 for a total of 12 weeks.  CURRENT THERAPY: patient is here for week 5 of 12 Taxol  INTERVAL HISTORY: Vanessa Mills 51 y.o. female returns for followup visit today. Overall she is doing well. She has a little bit of fatigue but no paresthesias. She denies any fevers or chills headaches, myalgias or arthralgias. No tingling or numbness. She has been eating well, no disturbances in her sleep patterns. No bowel or bladder habits.No bruising or bleeding. Remainder of the 10 point review of systems is negative.  MEDICAL HISTORY: Past Medical History  Diagnosis Date  . Chronic mental illness   . Meniere's syndrome   . Crohn's disease   . PONV (postoperative nausea and vomiting)   . Raynaud's disease   . GERD (gastroesophageal reflux disease)     does not take medications for   . Headache     takes midodrine for migraines prn  . Breast cancer, ILC, Left, receptor+, Her2- 10/10/2010  . Mental disorder     bipolar, takes saphris at hs  . Chemotherapy follow-up  examination 12/28/2010  . Fatigue 12/28/2010    ALLERGIES:   has no known allergies.  MEDICATIONS:  Current Outpatient Prescriptions  Medication Sig Dispense Refill  . ALPRAZolam (XANAX) 1 MG tablet Take 1 mg by mouth daily as needed.        . AMBULATORY NON FORMULARY MEDICATION Medication Name: MAGIC MOUTHWASH 2 % Viscous Lidocaine  Maalox Benadryl Susp.  Disp: 1:1:1 263m bottle Sig: 147mPO Swish & Spit  q 3-4hrs. PRN mouth sores  200 mL  3  . asenapine (SAPHRIS) 5 MG SUBL Place 10 mg under the tongue at bedtime.       . diazepam (VALIUM) 10 MG tablet as needed.       . docusate sodium (COLACE) 100 MG capsule Take 100 mg by mouth as needed.        . isometheptene-acetaminophen-dichloralphenazone (MIDRIN) 65-325-100 MG capsule as needed.       . Multiple Vitamin (MULTIVITAMIN) capsule Take 1 capsule by mouth daily.        . propranolol (INDERAL) 60 MG tablet Take 60 mg by mouth 2 (two) times daily.        . Marland Kitchenriamterene-hydrochlorothiazide (DYAZIDE) 37.5-25 MG per capsule Take 1 capsule by mouth every morning.        . zolpidem (AMBIEN) 10 MG tablet Take 1 tablet (10 mg total) by mouth at bedtime as needed.  30 tablet  0    SURGICAL HISTORY:  Past Surgical History  Procedure Date  . Abdominal hysterectomy   . Bladder tuck   .  2 orbital fracture surgeries   . Sinus exploration   . Bladder suspension     done 2005  . Mastectomy modified radical 10/23/2010    Left Dr Margot Chimes  . Portacath placement 11/28/2010    via right subclavian - Dr Margot Chimes    REVIEW OF SYSTEMS:  A comprehensive review of systems was negative.   PHYSICAL EXAMINATION: General appearance: alert, cooperative and appears stated age Head: Normocephalic, without obvious abnormality, atraumatic Neck: no adenopathy, no carotid bruit, no JVD, supple, symmetrical, trachea midline and thyroid not enlarged, symmetric, no tenderness/mass/nodules Lymph nodes: Cervical, supraclavicular, and axillary nodes  normal. Resp: clear to auscultation bilaterally and normal percussion bilaterally Back: symmetric, no curvature. ROM normal. No CVA tenderness. Cardio: regular rate and rhythm, S1, S2 normal, no murmur, click, rub or gallop and normal apical impulse GI: soft, non-tender; bowel sounds normal; no masses,  no organomegaly Extremities: extremities normal, atraumatic, no cyanosis or edema Neurologic: Alert and oriented X 3, normal strength and tone. Normal symmetric reflexes. Normal coordination and gait Right breast is examined today she is noted to have a palpable mass without any nipple inversion or retraction.  ECOG PERFORMANCE STATUS: 1 - Symptomatic but completely ambulatory  Blood pressure 108/69, pulse 60, temperature 97.7 F (36.5 C), temperature source Oral, height 5' 8"  (1.727 m), weight 158 lb 6.4 oz (71.85 kg).  LABORATORY DATA: Lab Results  Component Value Date   WBC 3.9 02/28/2011   HGB 12.6 02/28/2011   HCT 37.5 02/28/2011   MCV 93.8 02/28/2011   PLT 283 02/28/2011      Chemistry      Component Value Date/Time   NA 137 02/28/2011 0947   K 3.7 02/28/2011 0947   CL 98 02/28/2011 0947   CO2 28 02/28/2011 0947   BUN 13 02/28/2011 0947   CREATININE 0.84 02/28/2011 0947      Component Value Date/Time   CALCIUM 10.0 02/28/2011 0947   ALKPHOS 56 02/28/2011 0947   AST 20 02/28/2011 0947   ALT 10 02/28/2011 0947   BILITOT 0.5 02/28/2011 0947       RADIOGRAPHIC STUDIES:  No results found.  ASSESSMENT: 51 year old female with:  1.  stage III breast cancer s/p mastectomy of the left breast which showed a 5 cm mass with 6 positive lymph nodes lobular features.  2.  Patient is status post 4 cycles of dose dense Adriamycin and Cytoxan which she tolerated very well.   3. She is now receiving weekly Taxol.  4.  She will proceed with week #5 of 12 planned Taxol.  PLAN:  1.  patient will continue to see [place] on a weekly basis during the duration of her taxane therapy.  2.  She  knows to call us with any problems questions or concerns.  3.  She will not take anymore premedication with Decadron at home. Her Decadron will be given as an IV prior to her getting the Taxol we will reduce the steroids to 12 mg iv.   All questions were answered. The patient knows to call the clinic with any problems, questions or concerns. We can certainly see the patient much sooner if necessary.  I spent 20 minutes counseling the patient face to face. The total time spent in the appointment was 30 minutes.    Marcy Panning, MD Medical/Oncology [facility] [phone] (beeper) [phone] (Office)  03/03/2011, 12:46 PM

## 2011-03-07 ENCOUNTER — Ambulatory Visit (HOSPITAL_BASED_OUTPATIENT_CLINIC_OR_DEPARTMENT_OTHER): Payer: Medicare Other

## 2011-03-07 ENCOUNTER — Telehealth: Payer: Self-pay | Admitting: Oncology

## 2011-03-07 ENCOUNTER — Encounter: Payer: Self-pay | Admitting: Oncology

## 2011-03-07 ENCOUNTER — Other Ambulatory Visit (HOSPITAL_BASED_OUTPATIENT_CLINIC_OR_DEPARTMENT_OTHER): Payer: Medicare Other | Admitting: Lab

## 2011-03-07 ENCOUNTER — Ambulatory Visit (HOSPITAL_BASED_OUTPATIENT_CLINIC_OR_DEPARTMENT_OTHER): Payer: Medicare Other | Admitting: Oncology

## 2011-03-07 VITALS — BP 124/81 | HR 52 | Temp 97.8°F | Ht 68.0 in | Wt 162.2 lb

## 2011-03-07 DIAGNOSIS — C50919 Malignant neoplasm of unspecified site of unspecified female breast: Secondary | ICD-10-CM | POA: Insufficient documentation

## 2011-03-07 DIAGNOSIS — Z5111 Encounter for antineoplastic chemotherapy: Secondary | ICD-10-CM

## 2011-03-07 DIAGNOSIS — C50419 Malignant neoplasm of upper-outer quadrant of unspecified female breast: Secondary | ICD-10-CM

## 2011-03-07 DIAGNOSIS — Z170 Estrogen receptor positive status [ER+]: Secondary | ICD-10-CM

## 2011-03-07 HISTORY — DX: Malignant neoplasm of unspecified site of unspecified female breast: C50.919

## 2011-03-07 LAB — CBC WITH DIFFERENTIAL/PLATELET
BASO%: 0.8 % (ref 0.0–2.0)
EOS%: 4.6 % (ref 0.0–7.0)
LYMPH%: 30.7 % (ref 14.0–49.7)
MCH: 31.7 pg (ref 25.1–34.0)
MCHC: 33.6 g/dL (ref 31.5–36.0)
MCV: 94.2 fL (ref 79.5–101.0)
MONO%: 5.7 % (ref 0.0–14.0)
Platelets: 312 10*3/uL (ref 145–400)
RBC: 3.79 10*6/uL (ref 3.70–5.45)
WBC: 3.7 10*3/uL — ABNORMAL LOW (ref 3.9–10.3)
nRBC: 0 % (ref 0–0)

## 2011-03-07 LAB — COMPREHENSIVE METABOLIC PANEL
ALT: 11 U/L (ref 0–35)
AST: 16 U/L (ref 0–37)
BUN: 16 mg/dL (ref 6–23)
Creatinine, Ser: 0.76 mg/dL (ref 0.50–1.10)
Total Bilirubin: 0.4 mg/dL (ref 0.3–1.2)

## 2011-03-07 MED ORDER — SODIUM CHLORIDE 0.9 % IV SOLN
Freq: Once | INTRAVENOUS | Status: AC
  Administered 2011-03-07: 11:00:00 via INTRAVENOUS

## 2011-03-07 MED ORDER — SODIUM CHLORIDE 0.9 % IJ SOLN
10.0000 mL | INTRAMUSCULAR | Status: DC | PRN
  Administered 2011-03-07: 10 mL
  Filled 2011-03-07: qty 10

## 2011-03-07 MED ORDER — DIPHENHYDRAMINE HCL 50 MG/ML IJ SOLN
50.0000 mg | Freq: Once | INTRAMUSCULAR | Status: AC
  Administered 2011-03-07: 50 mg via INTRAVENOUS

## 2011-03-07 MED ORDER — PACLITAXEL CHEMO INJECTION 300 MG/50ML
80.0000 mg/m2 | Freq: Once | INTRAVENOUS | Status: AC
  Administered 2011-03-07: 150 mg via INTRAVENOUS
  Filled 2011-03-07: qty 25

## 2011-03-07 MED ORDER — ONDANSETRON 8 MG/50ML IVPB (CHCC)
8.0000 mg | Freq: Once | INTRAVENOUS | Status: AC
  Administered 2011-03-07: 8 mg via INTRAVENOUS

## 2011-03-07 MED ORDER — DEXAMETHASONE SODIUM PHOSPHATE 4 MG/ML IJ SOLN
12.0000 mg | Freq: Once | INTRAMUSCULAR | Status: AC
  Administered 2011-03-07: 12 mg via INTRAVENOUS

## 2011-03-07 MED ORDER — FAMOTIDINE IN NACL 20-0.9 MG/50ML-% IV SOLN
20.0000 mg | Freq: Once | INTRAVENOUS | Status: AC
  Administered 2011-03-07: 20 mg via INTRAVENOUS

## 2011-03-07 MED ORDER — HEPARIN SOD (PORK) LOCK FLUSH 100 UNIT/ML IV SOLN
500.0000 [IU] | Freq: Once | INTRAVENOUS | Status: AC | PRN
  Administered 2011-03-07: 500 [IU]
  Filled 2011-03-07: qty 5

## 2011-03-07 NOTE — Telephone Encounter (Signed)
gve the pt her revised march 2013 appt

## 2011-03-07 NOTE — Progress Notes (Signed)
OFFICE PROGRESS NOTE  CC  [name], MD  [name], MD, MD [name] [address]  DIAGNOSIS: 51 year old female with:  1.  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  PRIOR THERAPY:  #1 status post mastectomy with axillary lymph node dissection for a stage IIIa invasive lobular carcinoma measuring 6 x 8 cm with 6 positive lymph nodes.  #2 patient is receiving adjuvant chemotherapy consisting of Adriamycin and Cytoxan given dose dense every 2 weeks she will complete cycle #4 today  #3 she will then begin weekly Taxol starting on 01/31/2011 for a total of 12 weeks.  CURRENT THERAPY: patient is here for week 6 of 12 Taxol  INTERVAL HISTORY: Vanessa Mills 51 y.o. female returns for followup visit today. Overall she is doing well. She has a little bit of fatigue but no paresthesias. She denies any fevers or chills headaches, myalgias or arthralgias. No tingling or numbness. She has been eating well, no disturbances in her sleep patterns. No bowel or bladder habits.No bruising or bleeding. Remainder of the 10 point review of systems is negative.  MEDICAL HISTORY: Past Medical History  Diagnosis Date  . Chronic mental illness   . Meniere's syndrome   . Crohn's disease   . PONV (postoperative nausea and vomiting)   . Raynaud's disease   . GERD (gastroesophageal reflux disease)     does not take medications for   . Headache     takes midodrine for migraines prn  . Breast cancer, ILC, Left, receptor+, Her2- 10/10/2010  . Mental disorder     bipolar, takes saphris at hs  . Chemotherapy follow-up  examination 12/28/2010  . Fatigue 12/28/2010  . Breast cancer 03/07/2011    ALLERGIES:   has no known allergies.  MEDICATIONS:  Current Outpatient Prescriptions  Medication Sig Dispense Refill  . ALPRAZolam (XANAX) 1 MG tablet Take 1 mg by mouth daily as needed.        . AMBULATORY NON FORMULARY MEDICATION Medication Name: MAGIC MOUTHWASH 2 % Viscous Lidocaine  Maalox Benadryl Susp.  Disp: 1:1:1 264m bottle Sig: 163mPO Swish & Spit  q 3-4hrs. PRN mouth sores  200 mL  3  . asenapine (SAPHRIS) 5 MG SUBL Place 10 mg under the tongue at bedtime.       . diazepam (VALIUM) 10 MG tablet as needed.       . docusate sodium (COLACE) 100 MG capsule Take 100 mg by mouth as needed.        . isometheptene-acetaminophen-dichloralphenazone (MIDRIN) 65-325-100 MG capsule as needed.       . Multiple Vitamin (MULTIVITAMIN) capsule Take 1 capsule by mouth daily.        . propranolol (INDERAL) 60 MG tablet Take 60 mg by mouth 2 (two) times daily.        . Marland Kitchenriamterene-hydrochlorothiazide (DYAZIDE) 37.5-25 MG per capsule Take 1 capsule by mouth every morning.        . zolpidem (AMBIEN) 10 MG tablet Take 1 tablet (10 mg total) by mouth at bedtime as needed.  30 tablet  0    SURGICAL HISTORY:  Past Surgical History  Procedure Date  . Abdominal hysterectomy   .  Bladder tuck   . 2 orbital fracture surgeries   . Sinus exploration   . Bladder suspension     done 2005  . Mastectomy modified radical 10/23/2010    Left Dr Margot Chimes  . Portacath placement 11/28/2010    via right subclavian - Dr Margot Chimes    REVIEW OF SYSTEMS:  A comprehensive review of systems was negative.   PHYSICAL EXAMINATION: General appearance: alert, cooperative and appears stated age Head: Normocephalic, without obvious abnormality, atraumatic Neck: no adenopathy, no carotid bruit, no JVD, supple, symmetrical, trachea midline and thyroid not enlarged, symmetric, no tenderness/mass/nodules Lymph nodes: Cervical, supraclavicular, and  axillary nodes normal. Resp: clear to auscultation bilaterally and normal percussion bilaterally Back: symmetric, no curvature. ROM normal. No CVA tenderness. Cardio: regular rate and rhythm, S1, S2 normal, no murmur, click, rub or gallop and normal apical impulse GI: soft, non-tender; bowel sounds normal; no masses,  no organomegaly Extremities: extremities normal, atraumatic, no cyanosis or edema Neurologic: Alert and oriented X 3, normal strength and tone. Normal symmetric reflexes. Normal coordination and gait Right breast is examined today she is noted to have a palpable mass without any nipple inversion or retraction.  ECOG PERFORMANCE STATUS: 1 - Symptomatic but completely ambulatory  Blood pressure 124/81, pulse 52, temperature 97.8 F (36.6 C), temperature source Oral, height 5' 8"  (1.727 m), weight 162 lb 3.2 oz (73.573 kg).  LABORATORY DATA: Lab Results  Component Value Date   WBC 3.7* 03/07/2011   HGB 12.0 03/07/2011   HCT 35.7 03/07/2011   MCV 94.2 03/07/2011   PLT 312 03/07/2011      Chemistry      Component Value Date/Time   NA 137 02/28/2011 0947   K 3.7 02/28/2011 0947   CL 98 02/28/2011 0947   CO2 28 02/28/2011 0947   BUN 13 02/28/2011 0947   CREATININE 0.84 02/28/2011 0947      Component Value Date/Time   CALCIUM 10.0 02/28/2011 0947   ALKPHOS 56 02/28/2011 0947   AST 20 02/28/2011 0947   ALT 10 02/28/2011 0947   BILITOT 0.5 02/28/2011 0947       RADIOGRAPHIC STUDIES:  No results found.  ASSESSMENT: 51 year old female with:  1.  stage III breast cancer s/p mastectomy of the left breast which showed a 5 cm mass with 6 positive lymph nodes lobular features.  2.  Patient is status post 4 cycles of dose dense Adriamycin and Cytoxan which she tolerated very well.   3. She is now receiving weekly Taxol.  4.  She will proceed with week #6 of 12 planned Taxol.  PLAN:  1.  patient will continue to see [place] on a weekly basis during the duration of her taxane  therapy.  2.  She knows to call us with any problems questions or concerns.  3.  She will not take anymore premedication with Decadron at home. Her Decadron will be given as an IV prior to her getting the Taxol we will reduce the steroids to 12 mg iv.   All questions were answered. The patient knows to call the clinic with any problems, questions or concerns. We can certainly see the patient much sooner if necessary.  I spent 20 minutes counseling the patient face to face. The total time spent in the appointment was 30 minutes.    Marcy Panning, MD Medical/Oncology [facility] [phone] (beeper) [phone] (Office)  03/07/2011, 10:26 AM

## 2011-03-07 NOTE — Patient Instructions (Signed)
[facility] Discharge Instructions for Patients Receiving Chemotherapy  Today you received the following chemotherapy agents :Taxol  To help prevent nausea and vomiting after your treatment, we encourage you to take your nausea medication if needed : Zofran 8 mg every 12 hours as needed    If you develop nausea and vomiting that is not controlled by your nausea medication, call the clinic. If it is after clinic hours your family physician or the after hours number for the clinic or go to the Emergency Department.   BELOW ARE SYMPTOMS THAT SHOULD BE REPORTED IMMEDIATELY:  *FEVER GREATER THAN 100.5 F  *CHILLS WITH OR WITHOUT FEVER  NAUSEA AND VOMITING THAT IS NOT CONTROLLED WITH YOUR NAUSEA MEDICATION  *UNUSUAL SHORTNESS OF BREATH  *UNUSUAL BRUISING OR BLEEDING  TENDERNESS IN MOUTH AND THROAT WITH OR WITHOUT PRESENCE OF ULCERS  *URINARY PROBLEMS  *BOWEL PROBLEMS  UNUSUAL RASH Items with * indicate a potential emergency and should be followed up as soon as possible.  Feel free to call the clinic you have any questions or concerns. The clinic phone number is [phone].   I have been informed and understand all the instructions given to me. I know to contact the clinic, my physician, or go to the Emergency Department if any problems should occur. I do not have any questions at this time, but understand that I may call the clinic during office hours   should I have any questions or need assistance in obtaining follow up care.    __________________________________________  _____________  __________ Signature of Patient or Authorized Representative            Date                   Time    __________________________________________ Nurse's Signature

## 2011-03-14 ENCOUNTER — Ambulatory Visit (HOSPITAL_BASED_OUTPATIENT_CLINIC_OR_DEPARTMENT_OTHER): Payer: Medicare Other | Admitting: Family

## 2011-03-14 ENCOUNTER — Other Ambulatory Visit: Payer: Medicare Other | Admitting: Lab

## 2011-03-14 ENCOUNTER — Ambulatory Visit (HOSPITAL_BASED_OUTPATIENT_CLINIC_OR_DEPARTMENT_OTHER): Payer: Medicare Other

## 2011-03-14 ENCOUNTER — Encounter: Payer: Self-pay | Admitting: Family

## 2011-03-14 ENCOUNTER — Ambulatory Visit: Payer: Medicare Other | Admitting: Physician Assistant

## 2011-03-14 VITALS — BP 127/79 | HR 57 | Temp 97.9°F | Ht 68.0 in | Wt 161.2 lb

## 2011-03-14 DIAGNOSIS — C50919 Malignant neoplasm of unspecified site of unspecified female breast: Secondary | ICD-10-CM

## 2011-03-14 DIAGNOSIS — R5381 Other malaise: Secondary | ICD-10-CM

## 2011-03-14 DIAGNOSIS — Z5111 Encounter for antineoplastic chemotherapy: Secondary | ICD-10-CM

## 2011-03-14 DIAGNOSIS — C50419 Malignant neoplasm of upper-outer quadrant of unspecified female breast: Secondary | ICD-10-CM

## 2011-03-14 LAB — CBC WITH DIFFERENTIAL/PLATELET
BASO%: 1.3 % (ref 0.0–2.0)
Basophils Absolute: 0 10*3/uL (ref 0.0–0.1)
EOS%: 1.6 % (ref 0.0–7.0)
HGB: 12.5 g/dL (ref 11.6–15.9)
MCH: 32 pg (ref 25.1–34.0)
MCHC: 34.2 g/dL (ref 31.5–36.0)
MONO#: 0.2 10*3/uL (ref 0.1–0.9)
RDW: 12.8 % (ref 11.2–14.5)
WBC: 3.2 10*3/uL — ABNORMAL LOW (ref 3.9–10.3)
lymph#: 1 10*3/uL (ref 0.9–3.3)

## 2011-03-14 LAB — COMPREHENSIVE METABOLIC PANEL
ALT: 10 U/L (ref 0–35)
AST: 16 U/L (ref 0–37)
Albumin: 4.2 g/dL (ref 3.5–5.2)
CO2: 28 mEq/L (ref 19–32)
Calcium: 9.5 mg/dL (ref 8.4–10.5)
Chloride: 100 mEq/L (ref 96–112)
Potassium: 4.1 mEq/L (ref 3.5–5.3)

## 2011-03-14 MED ORDER — DEXAMETHASONE SODIUM PHOSPHATE 4 MG/ML IJ SOLN
12.0000 mg | Freq: Once | INTRAMUSCULAR | Status: AC
  Administered 2011-03-14: 12 mg via INTRAVENOUS

## 2011-03-14 MED ORDER — SODIUM CHLORIDE 0.9 % IJ SOLN
10.0000 mL | INTRAMUSCULAR | Status: DC | PRN
  Administered 2011-03-14: 10 mL
  Filled 2011-03-14: qty 10

## 2011-03-14 MED ORDER — DIPHENHYDRAMINE HCL 50 MG/ML IJ SOLN
50.0000 mg | Freq: Once | INTRAMUSCULAR | Status: AC
  Administered 2011-03-14: 50 mg via INTRAVENOUS

## 2011-03-14 MED ORDER — ONDANSETRON 8 MG/50ML IVPB (CHCC)
8.0000 mg | Freq: Once | INTRAVENOUS | Status: AC
  Administered 2011-03-14: 8 mg via INTRAVENOUS

## 2011-03-14 MED ORDER — DEXTROSE 5 % IV SOLN
80.0000 mg/m2 | Freq: Once | INTRAVENOUS | Status: AC
  Administered 2011-03-14: 150 mg via INTRAVENOUS
  Filled 2011-03-14: qty 25

## 2011-03-14 MED ORDER — FAMOTIDINE IN NACL 20-0.9 MG/50ML-% IV SOLN
20.0000 mg | Freq: Once | INTRAVENOUS | Status: AC
  Administered 2011-03-14: 20 mg via INTRAVENOUS

## 2011-03-14 MED ORDER — HEPARIN SOD (PORK) LOCK FLUSH 100 UNIT/ML IV SOLN
500.0000 [IU] | Freq: Once | INTRAVENOUS | Status: AC | PRN
  Administered 2011-03-14: 500 [IU]
  Filled 2011-03-14: qty 5

## 2011-03-14 MED ORDER — SODIUM CHLORIDE 0.9 % IV SOLN
Freq: Once | INTRAVENOUS | Status: AC
  Administered 2011-03-14: 12:00:00 via INTRAVENOUS
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
## 2011-03-21 ENCOUNTER — Ambulatory Visit (HOSPITAL_BASED_OUTPATIENT_CLINIC_OR_DEPARTMENT_OTHER): Payer: Medicare Other | Admitting: Family

## 2011-03-21 ENCOUNTER — Ambulatory Visit (HOSPITAL_BASED_OUTPATIENT_CLINIC_OR_DEPARTMENT_OTHER): Payer: Medicare Other

## 2011-03-21 ENCOUNTER — Other Ambulatory Visit: Payer: Medicare Other | Admitting: Lab

## 2011-03-21 ENCOUNTER — Encounter: Payer: Self-pay | Admitting: Family

## 2011-03-21 ENCOUNTER — Ambulatory Visit: Payer: Medicare Other | Admitting: Physician Assistant

## 2011-03-21 VITALS — BP 134/86 | HR 52 | Temp 98.2°F | Ht 68.0 in | Wt 163.8 lb

## 2011-03-21 DIAGNOSIS — Z5111 Encounter for antineoplastic chemotherapy: Secondary | ICD-10-CM

## 2011-03-21 DIAGNOSIS — C50919 Malignant neoplasm of unspecified site of unspecified female breast: Secondary | ICD-10-CM

## 2011-03-21 DIAGNOSIS — C50419 Malignant neoplasm of upper-outer quadrant of unspecified female breast: Secondary | ICD-10-CM

## 2011-03-21 LAB — CBC WITH DIFFERENTIAL/PLATELET
Basophils Absolute: 0 10*3/uL (ref 0.0–0.1)
Eosinophils Absolute: 0.1 10*3/uL (ref 0.0–0.5)
HCT: 36.3 % (ref 34.8–46.6)
HGB: 12.6 g/dL (ref 11.6–15.9)
LYMPH%: 29.5 % (ref 14.0–49.7)
MCV: 96.3 fL (ref 79.5–101.0)
MONO#: 0.2 10*3/uL (ref 0.1–0.9)
MONO%: 5.4 % (ref 0.0–14.0)
NEUT#: 2.4 10*3/uL (ref 1.5–6.5)
NEUT%: 62.6 % (ref 38.4–76.8)
Platelets: 309 10*3/uL (ref 145–400)
RBC: 3.77 10*6/uL (ref 3.70–5.45)
WBC: 3.8 10*3/uL — ABNORMAL LOW (ref 3.9–10.3)

## 2011-03-21 LAB — COMPREHENSIVE METABOLIC PANEL
Alkaline Phosphatase: 49 U/L (ref 39–117)
BUN: 19 mg/dL (ref 6–23)
CO2: 28 mEq/L (ref 19–32)
Creatinine, Ser: 0.89 mg/dL (ref 0.50–1.10)
Glucose, Bld: 106 mg/dL — ABNORMAL HIGH (ref 70–99)
Total Bilirubin: 0.3 mg/dL (ref 0.3–1.2)
Total Protein: 6.5 g/dL (ref 6.0–8.3)

## 2011-03-21 MED ORDER — DEXAMETHASONE SODIUM PHOSPHATE 4 MG/ML IJ SOLN
12.0000 mg | Freq: Once | INTRAMUSCULAR | Status: AC
  Administered 2011-03-21: 12:00:00 via INTRAVENOUS

## 2011-03-21 MED ORDER — ONDANSETRON 8 MG/50ML IVPB (CHCC)
8.0000 mg | Freq: Once | INTRAVENOUS | Status: AC
  Administered 2011-03-21: 8 mg via INTRAVENOUS

## 2011-03-21 MED ORDER — FAMOTIDINE IN NACL 20-0.9 MG/50ML-% IV SOLN
20.0000 mg | Freq: Once | INTRAVENOUS | Status: AC
  Administered 2011-03-21: 20 mg via INTRAVENOUS

## 2011-03-21 MED ORDER — DIPHENHYDRAMINE HCL 50 MG/ML IJ SOLN
50.0000 mg | Freq: Once | INTRAMUSCULAR | Status: AC
Start: 2011-03-21 — End: 2011-03-21
  Administered 2011-03-21: 50 mg via INTRAVENOUS

## 2011-03-21 MED ORDER — SODIUM CHLORIDE 0.9 % IV SOLN
Freq: Once | INTRAVENOUS | Status: AC
  Administered 2011-03-21: 12:00:00 via INTRAVENOUS

## 2011-03-21 MED ORDER — PACLITAXEL CHEMO INJECTION 300 MG/50ML
80.0000 mg/m2 | Freq: Once | INTRAVENOUS | Status: AC
  Administered 2011-03-21: 150 mg via INTRAVENOUS
  Filled 2011-03-21: qty 25

## 2011-03-21 MED ORDER — SODIUM CHLORIDE 0.9 % IJ SOLN
10.0000 mL | INTRAMUSCULAR | Status: DC | PRN
  Administered 2011-03-21: 10 mL
  Filled 2011-03-21: qty 10

## 2011-03-21 MED ORDER — HEPARIN SOD (PORK) LOCK FLUSH 100 UNIT/ML IV SOLN
500.0000 [IU] | Freq: Once | INTRAVENOUS | Status: AC | PRN
  Administered 2011-03-21: 500 [IU]
  Filled 2011-03-21: qty 5

## 2011-03-21 NOTE — Patient Instructions (Signed)
[facility] Discharge Instructions for Patients Receiving Chemotherapy  Today you received the following chemotherapy agents Taxol. To help prevent nausea and vomiting after your treatment, we encourage you to take your nausea medication as prescribed by your physician.  If you develop nausea and vomiting that is not controlled by your nausea medication, call the clinic. If it is after clinic hours your family physician or the after hours number for the clinic or go to the Emergency Department.   BELOW ARE SYMPTOMS THAT SHOULD BE REPORTED IMMEDIATELY:  *FEVER GREATER THAN 100.5 F  *CHILLS WITH OR WITHOUT FEVER  NAUSEA AND VOMITING THAT IS NOT CONTROLLED WITH YOUR NAUSEA MEDICATION  *UNUSUAL SHORTNESS OF BREATH  *UNUSUAL BRUISING OR BLEEDING  TENDERNESS IN MOUTH AND THROAT WITH OR WITHOUT PRESENCE OF ULCERS  *URINARY PROBLEMS  *BOWEL PROBLEMS  UNUSUAL RASH Items with * indicate a potential emergency and should be followed up as soon as possible.  Feel free to call the clinic you have any questions or concerns. The clinic phone number is [phone].   I have been informed and understand all the instructions given to me. I know to contact the clinic, my physician, or go to the Emergency Department if any problems should occur. I do not have any questions at this time, but understand that I may call the clinic during office hours   should I have any questions or need assistance in obtaining follow up care.    __________________________________________  _____________  __________ Signature of Patient or Authorized Representative            Date                   Time    __________________________________________ Nurse's Signature

## 2011-03-21 NOTE — Progress Notes (Signed)
[place]  Name: Vanessa Mills                  DATE: 03/21/2011 MRN: [id number]                      DOB: 1960-08-06  CC: [name], MD         [name], MD.   REFERRING PHYSICIAN: [name], MD  DIAGNOSIS: Patient Active Problem List  Diagnoses Date Noted  . Bipolar disorder 01/30/2011    Priority: Medium  . Chemotherapy follow-up examination 12/28/2010  . Fatigue 12/28/2010  . Breast cancer, ILC, Left, receptor+, Her2- 10/10/2010     Encounter Diagnosis  Name Primary?  . 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 Final pathology showed a 5 cm invasive ductal carcinoma with lobular features. Six nodes were positive for metastatic disease, ER/PR+, HER-2/neu negative with a proliferation marker of 12%.   PRIOR THERAPY:  1. Mastectomy with axillary lymph node dissection measuring 6 x 8 cm with 6 positive lymph nodes.  2. Adjuvant chemotherapy, completed Adriamycin and Cytoxan given dose dense every 2 weeks for 4 cycles.    CURRENT THERAPY: Weekly Taxol X 12, this will be week 8. (01/31/11 - 04/18/11).   INTERIM HISTORY:  No nausea or vomiting. Bowel and bladder function normal. Appetite is good. No headache or blurred vision, no cough or dyspnea, no abdominal pain, no new bone pain. Is happy to report that she virtually has no side effects from the chemo, other than fatigue.   PHYSICAL EXAM: BP 134/86  Pulse 52  Temp(Src) 98.2 F (36.8 C) (Oral)  Ht 5' 8"  (1.727 m)  Wt 163 lb 12.8 oz (74.299 kg)  BMI 24.91 kg/m2 General: Well developed, well nourished, white female in no acute distress. Accompanied by her husband. EENT: No ocular or oral lesions. No stomatitis.  Respiratory: Lungs are clear to auscultation bilaterally with normal respiratory movement and no accessory muscle use. Cardiac: No murmur,  rub or tachycardia. No upper or lower extremity edema or tenderness.  GI: Abdomen is soft, no palpable hepatosplenomegaly. No fluid wave. No tenderness. Musculoskeletal: No kyphosis, no tenderness over the spine, ribs or hips. Lymph: No cervical, infraclavicular, axillary or inguinal adenopathy. Neuro: No focal neurological deficits. Psych: Alert and oriented X 3, upbeat and animated.   SOCIAL HISTORY:   Social History  . Marital Status: Married   Social History Main Topics  . Smoking status: Never Smoker   . Alcohol Use: 1.7 oz/week    1 Drinks containing 0.5 oz of alcohol, 2 Glasses of wine per week  . Drug Use: Yes    Special: Marijuana     in college   Social History Narrative   Married, One dtr- 51 years old- [name]- in [place]:   Results for orders placed in visit on 03/21/11  CBC WITH DIFFERENTIAL      Component Value Range   WBC 3.8 (*) 3.9 - 10.3 (10e3/uL)   NEUT# 2.4  1.5 - 6.5 (10e3/uL)   HGB 12.6  11.6 - 15.9 (g/dL)   HCT 36.3  34.8 - 46.6 (%)   Platelets 309  145 - 400 (10e3/uL)   MCV 96.3  79.5 - 101.0 (fL)   MCH 33.3  25.1 - 34.0 (pg)   MCHC 34.6  31.5 -  36.0 (g/dL)   RBC 3.77  3.70 - 5.45 (10e6/uL)   RDW 13.0  11.2 - 14.5 (%)   lymph# 1.1  0.9 - 3.3 (10e3/uL)   MONO# 0.2  0.1 - 0.9 (10e3/uL)   Eosinophils Absolute 0.1  0.0 - 0.5 (10e3/uL)   Basophils Absolute 0.0  0.0 - 0.1 (10e3/uL)   NEUT% 62.6  38.4 - 76.8 (%)   LYMPH% 29.5  14.0 - 49.7 (%)   MONO% 5.4  0.0 - 14.0 (%)   EOS% 1.9  0.0 - 7.0 (%)   BASO% 0.6  0.0 - 2.0 (%)  COMPREHENSIVE METABOLIC PANEL      Component Value Range   Sodium 141  135 - 145 (mEq/L)   Potassium 3.5  3.5 - 5.3 (mEq/L)   Chloride 102  96 - 112 (mEq/L)   CO2 28  19 - 32 (mEq/L)   Glucose, Bld 106 (*) 70 - 99 (mg/dL)   BUN 19  6 - 23 (mg/dL)   Creatinine, Ser 0.89  0.50 - 1.10 (mg/dL)   Total Bilirubin 0.3  0.3 - 1.2 (mg/dL)   Alkaline Phosphatase 49  39 - 117 (U/L)   AST 17  0 - 37 (U/L)     ALT 15  0 - 35 (U/L)   Total Protein 6.5  6.0 - 8.3 (g/dL)   Albumin 4.2  3.5 - 5.2 (g/dL)   Calcium 9.8  8.4 - 10.5 (mg/dL)    IMPRESSION:  51 year old white female with: 1. Stage IIIA invasive lobular carcinoma, left breast. Status post left mastectomy, completed 3 cycles of dose dense Adriamycin/Cytoxan with good tolerance. Now on cycle 7 of Taxol (12 planned).  2. Fatigue, self-limited, related to chemotherapy. 3. Diagnosis of bipolar disorder, managed by others.  4. Labs OK to treat.   PLAN:   1. Weekly Taxol treatment today, this will be cycle 8 of 12 planned. 2. Return to clinic 2/28 for appt with me, lab and treatment. Will see Dr. Humphrey Rolls 04/03/11. 3. Labs orders are entered for those visits.

## 2011-03-25 ENCOUNTER — Encounter: Payer: Self-pay | Admitting: *Deleted

## 2011-03-25 NOTE — Progress Notes (Signed)
[facility] Brief Psychosocial Assessment Clinical Social Work  Clinical Social Work was referred by patient for assistance with transportation.  Clinical Social Worker spoke with the  Patient to offer support and assess for needs.  The patient plans to start radiation treatment in April and will need help getting to all her treatments. Currently, her friends are providing transportation to her chemo treatments and bringing over meals.  The patient identifies this as a huge support at this time.     Clinical Social Work interventions: CSW submitted referral to [place] [facility] program to assist with transportation needs.  CSW also plans to meet with patient at next visit to provide information on [facility] assistance.   Polo Riley, MSW, [place] Worker [facility] [phone]

## 2011-03-28 ENCOUNTER — Ambulatory Visit (HOSPITAL_BASED_OUTPATIENT_CLINIC_OR_DEPARTMENT_OTHER): Payer: Medicare Other

## 2011-03-28 ENCOUNTER — Ambulatory Visit: Payer: Medicare Other | Admitting: Physician Assistant

## 2011-03-28 ENCOUNTER — Ambulatory Visit: Payer: Medicare Other

## 2011-03-28 ENCOUNTER — Other Ambulatory Visit: Payer: Medicare Other

## 2011-03-28 ENCOUNTER — Encounter: Payer: Self-pay | Admitting: Family

## 2011-03-28 ENCOUNTER — Other Ambulatory Visit: Payer: Medicare Other | Admitting: Lab

## 2011-03-28 ENCOUNTER — Ambulatory Visit (HOSPITAL_BASED_OUTPATIENT_CLINIC_OR_DEPARTMENT_OTHER): Payer: Medicare Other | Admitting: Family

## 2011-03-28 VITALS — BP 129/84 | HR 63 | Temp 98.2°F | Ht 68.0 in | Wt 164.3 lb

## 2011-03-28 DIAGNOSIS — F319 Bipolar disorder, unspecified: Secondary | ICD-10-CM

## 2011-03-28 DIAGNOSIS — R5383 Other fatigue: Secondary | ICD-10-CM

## 2011-03-28 DIAGNOSIS — C50419 Malignant neoplasm of upper-outer quadrant of unspecified female breast: Secondary | ICD-10-CM

## 2011-03-28 DIAGNOSIS — C50919 Malignant neoplasm of unspecified site of unspecified female breast: Secondary | ICD-10-CM

## 2011-03-28 DIAGNOSIS — Z5111 Encounter for antineoplastic chemotherapy: Secondary | ICD-10-CM

## 2011-03-28 DIAGNOSIS — T451X5A Adverse effect of antineoplastic and immunosuppressive drugs, initial encounter: Secondary | ICD-10-CM

## 2011-03-28 DIAGNOSIS — R5381 Other malaise: Secondary | ICD-10-CM

## 2011-03-28 LAB — COMPREHENSIVE METABOLIC PANEL
AST: 16 U/L (ref 0–37)
Alkaline Phosphatase: 50 U/L (ref 39–117)
BUN: 18 mg/dL (ref 6–23)
Calcium: 9.3 mg/dL (ref 8.4–10.5)
Chloride: 101 mEq/L (ref 96–112)
Creatinine, Ser: 0.8 mg/dL (ref 0.50–1.10)
Total Bilirubin: 0.3 mg/dL (ref 0.3–1.2)

## 2011-03-28 LAB — CBC WITH DIFFERENTIAL/PLATELET
BASO%: 1.1 % (ref 0.0–2.0)
EOS%: 1.7 % (ref 0.0–7.0)
HCT: 37 % (ref 34.8–46.6)
LYMPH%: 24.6 % (ref 14.0–49.7)
MCH: 31.8 pg (ref 25.1–34.0)
MCHC: 33.8 g/dL (ref 31.5–36.0)
MCV: 94.1 fL (ref 79.5–101.0)
MONO%: 5.4 % (ref 0.0–14.0)
NEUT%: 67.2 % (ref 38.4–76.8)
Platelets: 312 10*3/uL (ref 145–400)
RBC: 3.93 10*6/uL (ref 3.70–5.45)

## 2011-03-28 MED ORDER — PACLITAXEL CHEMO INJECTION 300 MG/50ML
80.0000 mg/m2 | Freq: Once | INTRAVENOUS | Status: AC
  Administered 2011-03-28: 150 mg via INTRAVENOUS
  Filled 2011-03-28: qty 25

## 2011-03-28 MED ORDER — HEPARIN SOD (PORK) LOCK FLUSH 100 UNIT/ML IV SOLN
500.0000 [IU] | Freq: Once | INTRAVENOUS | Status: AC | PRN
  Administered 2011-03-28: 500 [IU]
  Filled 2011-03-28: qty 5

## 2011-03-28 MED ORDER — DIPHENHYDRAMINE HCL 50 MG/ML IJ SOLN
50.0000 mg | Freq: Once | INTRAMUSCULAR | Status: AC
  Administered 2011-03-28: 50 mg via INTRAVENOUS

## 2011-03-28 MED ORDER — SODIUM CHLORIDE 0.9 % IJ SOLN
10.0000 mL | INTRAMUSCULAR | Status: DC | PRN
  Administered 2011-03-28: 10 mL
  Filled 2011-03-28: qty 10

## 2011-03-28 MED ORDER — SODIUM CHLORIDE 0.9 % IV SOLN
Freq: Once | INTRAVENOUS | Status: AC
  Administered 2011-03-28: 12:00:00 via INTRAVENOUS

## 2011-03-28 MED ORDER — DEXAMETHASONE SODIUM PHOSPHATE 4 MG/ML IJ SOLN
12.0000 mg | Freq: Once | INTRAMUSCULAR | Status: AC
  Administered 2011-03-28: 12 mg via INTRAVENOUS

## 2011-03-28 MED ORDER — FAMOTIDINE IN NACL 20-0.9 MG/50ML-% IV SOLN
20.0000 mg | Freq: Once | INTRAVENOUS | Status: AC
  Administered 2011-03-28: 20 mg via INTRAVENOUS

## 2011-03-28 MED ORDER — FAMOTIDINE IN NACL 20-0.9 MG/50ML-% IV SOLN: 20.0000 mg | Freq: Once | INTRAVENOUS | Status: DC

## 2011-03-28 MED ORDER — ONDANSETRON 8 MG/50ML IVPB (CHCC)
8.0000 mg | Freq: Once | INTRAVENOUS | Status: AC
  Administered 2011-03-28: 8 mg via INTRAVENOUS

## 2011-03-28 NOTE — Patient Instructions (Signed)
Proceed with treatment today. Will move treatment up one day next week, Dr. Humphrey Rolls will be off on regularly scheduled day.

## 2011-03-28 NOTE — Progress Notes (Signed)
[place]  Name: Vanessa Mills                  DATE: 03/28/2011 MRN: [id number]                      DOB: [birth year]  CC: [name], MD         [name], MD.   REFERRING PHYSICIAN: [name], MD  DIAGNOSIS: Patient Active Problem List  Diagnoses Date Noted  . Bipolar disorder 01/30/2011    Priority: Medium  . Chemotherapy follow-up examination 12/28/2010  . Fatigue 12/28/2010  . Breast cancer, ILC, Left, receptor+, Her2- 10/10/2010     Encounter Diagnosis  Name Primary?  . 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 Final pathology showed a 5 cm invasive ductal carcinoma with lobular features. Six nodes were positive for metastatic disease, ER/PR+, HER-2/neu negative with a proliferation marker of 12%.   PRIOR THERAPY:  1. Left mastectomy with axillary lymph node dissection, invasive lobular cancer measuring 6 x 8 cm with 6 positive lymph nodes.  2. Adjuvant chemotherapy, completed Adriamycin and Cytoxan given dose dense every 2 weeks for 4 cycles.    CURRENT THERAPY: Weekly Taxol X 12, this will be week 9. (01/31/11 - 04/18/11).   INTERIM HISTORY:  No nausea or vomiting. Bowel and bladder function normal. Appetite is good. No headache or blurred vision, no cough or dyspnea, no abdominal pain, no new bone pain. Is happy to report that she virtually has no side effects from the chemo, other than fatigue, which has improved since completing AC. No pain.   PHYSICAL EXAM: BP 129/84  Pulse 63  Temp 98.2 F (36.8 C)  Ht 5' 8"  (1.727 m)  Wt 164 lb 4.8 oz (74.526 kg)  BMI 24.98 kg/m2 General: Well developed, well nourished, white female in no acute distress. Alone at today's visit. EENT: No ocular or oral lesions. No stomatitis.  Respiratory: Lungs are clear to auscultation bilaterally with normal  respiratory movement and no accessory muscle use. Cardiac: No murmur, rub or tachycardia. No upper or lower extremity edema or tenderness.  GI: Abdomen is soft, no palpable hepatosplenomegaly. No fluid wave. No tenderness. Musculoskeletal: No kyphosis, no tenderness over the spine, ribs or hips. Lymph: No cervical, infraclavicular, axillary or inguinal adenopathy. Neuro: No focal neurological deficits. Psych: Alert and oriented X 3, upbeat and animated.   SOCIAL HISTORY:   Social History  . Marital Status: Married   Social History Main Topics  . Smoking status: Never Smoker   . Alcohol Use: 1.7 oz/week    1 Drinks containing 0.5 oz of alcohol, 2 Glasses of wine per week  . Drug Use: Yes    Special: Marijuana     in college   Social History Narrative   Married, One dtr- 51 years old- [name]- in [place]:   Results for orders placed in visit on 03/28/11  CBC WITH DIFFERENTIAL      Component Value Range   WBC 3.5 (*) 3.9 - 10.3 (10e3/uL)   NEUT# 2.3  1.5 - 6.5 (10e3/uL)   HGB 12.5  11.6 - 15.9 (g/dL)   HCT 37.0  34.8 - 46.6 (%)   Platelets 312  145 - 400 (10e3/uL)   MCV 94.1  79.5 - 101.0 (fL)   MCH 31.8  25.1 - 34.0 (pg)   MCHC 33.8  31.5 - 36.0 (g/dL)   RBC 3.93  3.70 - 5.45 (10e6/uL)   RDW 12.3  11.2 - 14.5 (%)   lymph# 0.9  0.9 - 3.3 (10e3/uL)   MONO# 0.2  0.1 - 0.9 (10e3/uL)   Eosinophils Absolute 0.1  0.0 - 0.5 (10e3/uL)   Basophils Absolute 0.0  0.0 - 0.1 (10e3/uL)   NEUT% 67.2  38.4 - 76.8 (%)   LYMPH% 24.6  14.0 - 49.7 (%)   MONO% 5.4  0.0 - 14.0 (%)   EOS% 1.7  0.0 - 7.0 (%)   BASO% 1.1  0.0 - 2.0 (%)   nRBC 0  0 - 0 (%)    IMPRESSION:  51 year old white female with: 1. Stage IIIA invasive lobular carcinoma, left breast. Status post left mastectomy, completed 3 cycles of dose dense Adriamycin/Cytoxan with good tolerance. Now on cycle 9 of Taxol (12 planned).  2. Fatigue, self-limited, related to chemotherapy. 3. Diagnosis of  bipolar disorder, managed by others.  4. Labs OK to treat.   PLAN:   1. Weekly Taxol treatment today, this will be cycle 9 of 12 planned. 2. Return to clinic 3/6 for appt with Dr. Humphrey Rolls, lab and treatment. She will see me 3/14. 3. Labs orders are entered for those visits.

## 2011-03-28 NOTE — Patient Instructions (Signed)
Vanessa Mills Discharge Instructions for Patients Receiving Chemotherapy  Today you received the following chemotherapy agents Taxol To help prevent nausea and vomiting after your treatment, we encourage you to take your nausea medication Dr. Humphrey Rolls. If you develop nausea and vomiting that is not controlled by your nausea medication, call the clinic. If it is after clinic hours your family physician or the after hours number for the clinic or go to the Emergency Department.   BELOW ARE SYMPTOMS THAT SHOULD BE REPORTED IMMEDIATELY:  *FEVER GREATER THAN 100.5 F  *CHILLS WITH OR WITHOUT FEVER  NAUSEA AND VOMITING THAT IS NOT CONTROLLED WITH YOUR NAUSEA MEDICATION  *UNUSUAL SHORTNESS OF BREATH  *UNUSUAL BRUISING OR BLEEDING  TENDERNESS IN MOUTH AND THROAT WITH OR WITHOUT PRESENCE OF ULCERS  *URINARY PROBLEMS  *BOWEL PROBLEMS  UNUSUAL RASH Items with * indicate a potential emergency and should be followed up as soon as possible.   Feel free to call the clinic you have any questions or concerns. The clinic phone number is [phone].   I have been informed and understand all the instructions given to me. I know to contact the clinic, my physician, or go to the Emergency Department if any problems should occur. I do not have any questions at this time, but understand that I may call the clinic during office hours   should I have any questions or need assistance in obtaining follow up care.    __________________________________________  _____________  __________ Signature of Patient or Authorized Representative            Date                   Time    __________________________________________ Nurse's Signature

## 2011-04-03 ENCOUNTER — Other Ambulatory Visit (HOSPITAL_BASED_OUTPATIENT_CLINIC_OR_DEPARTMENT_OTHER): Payer: Medicare Other | Admitting: Lab

## 2011-04-03 ENCOUNTER — Ambulatory Visit (HOSPITAL_BASED_OUTPATIENT_CLINIC_OR_DEPARTMENT_OTHER): Payer: Medicare Other | Admitting: Oncology

## 2011-04-03 ENCOUNTER — Telehealth: Payer: Self-pay | Admitting: Oncology

## 2011-04-03 ENCOUNTER — Encounter: Payer: Self-pay | Admitting: Oncology

## 2011-04-03 DIAGNOSIS — C50919 Malignant neoplasm of unspecified site of unspecified female breast: Secondary | ICD-10-CM

## 2011-04-03 DIAGNOSIS — Z09 Encounter for follow-up examination after completed treatment for conditions other than malignant neoplasm: Secondary | ICD-10-CM

## 2011-04-03 DIAGNOSIS — Z170 Estrogen receptor positive status [ER+]: Secondary | ICD-10-CM

## 2011-04-03 DIAGNOSIS — Z9010 Acquired absence of unspecified breast and nipple: Secondary | ICD-10-CM

## 2011-04-03 DIAGNOSIS — C50419 Malignant neoplasm of upper-outer quadrant of unspecified female breast: Secondary | ICD-10-CM

## 2011-04-03 LAB — CBC WITH DIFFERENTIAL/PLATELET
BASO%: 0.3 % (ref 0.0–2.0)
EOS%: 1 % (ref 0.0–7.0)
MCH: 32.5 pg (ref 25.1–34.0)
MCHC: 33.7 g/dL (ref 31.5–36.0)
MCV: 96.5 fL (ref 79.5–101.0)
MONO%: 4.7 % (ref 0.0–14.0)
NEUT#: 2.1 10*3/uL (ref 1.5–6.5)
RBC: 3.95 10*6/uL (ref 3.70–5.45)
RDW: 12.9 % (ref 11.2–14.5)

## 2011-04-03 LAB — COMPREHENSIVE METABOLIC PANEL
AST: 16 U/L (ref 0–37)
Albumin: 4.3 g/dL (ref 3.5–5.2)
Alkaline Phosphatase: 49 U/L (ref 39–117)
Potassium: 3.9 mEq/L (ref 3.5–5.3)
Sodium: 140 mEq/L (ref 135–145)
Total Bilirubin: 0.3 mg/dL (ref 0.3–1.2)
Total Protein: 6.6 g/dL (ref 6.0–8.3)

## 2011-04-03 NOTE — Progress Notes (Signed)
OFFICE PROGRESS NOTE  CC  [name], MD  [name], MD, MD [facility] [place] [address]  DIAGNOSIS: 51 year old female with:  1.  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  PRIOR THERAPY:  #1 status post mastectomy with axillary lymph node dissection for a stage IIIa invasive lobular carcinoma measuring 6 x 8 cm with 6 positive lymph nodes.  #2 S/P 4 cycles of AC q 2 weeks with day 2 neulasta  #3 she will then begin weekly Taxol starting on 01/31/2011 for a total of 12 weeks.  CURRENT THERAPY: patient is here for week 10 of 12 Taxol  INTERVAL HISTORY: Vanessa Mills 51 y.o. female returns for followup visit today. Overall she is doing well. She has a little bit of fatigue but no paresthesias. She denies any fevers or chills headaches, myalgias or arthralgias. No tingling or numbness. She has been eating well, no disturbances in her sleep patterns. No bowel or bladder habits.No bruising or bleeding. Remainder of the 10 point review of systems is negative.  MEDICAL HISTORY: Past Medical History  Diagnosis Date  . Chronic mental illness   . Meniere's syndrome   . Crohn's disease   . PONV (postoperative nausea and vomiting)   . Raynaud's disease   . GERD (gastroesophageal reflux disease)     does not take medications for   . Headache     takes midodrine for migraines prn  . Breast cancer, ILC, Left, receptor+, Her2- 10/10/2010  . Mental disorder     bipolar, takes saphris at hs  . Chemotherapy follow-up examination 12/28/2010  . Fatigue 12/28/2010  . Breast cancer 03/07/2011    ALLERGIES:   has no  known allergies.  MEDICATIONS:  Current Outpatient Prescriptions  Medication Sig Dispense Refill  . ALPRAZolam (XANAX) 1 MG tablet Take 1 mg by mouth daily as needed.        . AMBULATORY NON FORMULARY MEDICATION Medication Name: MAGIC MOUTHWASH 2 % Viscous Lidocaine  Maalox Benadryl Susp.  Disp: 1:1:1 251m bottle Sig: 163mPO Swish & Spit  q 3-4hrs. PRN mouth sores  200 mL  3  . asenapine (SAPHRIS) 5 MG SUBL Place 10 mg under the tongue at bedtime.       . diazepam (VALIUM) 10 MG tablet as needed.       . docusate sodium (COLACE) 100 MG capsule Take 100 mg by mouth as needed.        . isometheptene-acetaminophen-dichloralphenazone (MIDRIN) 65-325-100 MG capsule as needed.       . Multiple Vitamin (MULTIVITAMIN) capsule Take 1 capsule by mouth daily.        . propranolol (INDERAL) 60 MG tablet Take 60 mg by mouth 2 (two) times daily.        . Marland Kitchenriamterene-hydrochlorothiazide (DYAZIDE) 37.5-25 MG per capsule Take 1 capsule by mouth every morning.        . zolpidem (AMBIEN) 10 MG tablet Take 1 tablet (10 mg total) by mouth at bedtime as needed.  30 tablet  0    SURGICAL HISTORY:  Past Surgical History  Procedure Date  . Abdominal hysterectomy   . Bladder tuck   . 2 orbital  fracture surgeries   . Sinus exploration   . Bladder suspension     done 2005  . Mastectomy modified radical 10/23/2010    Left Dr Margot Chimes  . Portacath placement 11/28/2010    via right subclavian - Dr Margot Chimes    REVIEW OF SYSTEMS:  A comprehensive review of systems was negative.   PHYSICAL EXAMINATION: General appearance: alert, cooperative and appears stated age Head: Normocephalic, without obvious abnormality, atraumatic Neck: no adenopathy, no carotid bruit, no JVD, supple, symmetrical, trachea midline and thyroid not enlarged, symmetric, no tenderness/mass/nodules Lymph nodes: Cervical, supraclavicular, and axillary nodes normal. Resp: clear to auscultation bilaterally and normal percussion  bilaterally Back: symmetric, no curvature. ROM normal. No CVA tenderness. Cardio: regular rate and rhythm, S1, S2 normal, no murmur, click, rub or gallop and normal apical impulse GI: soft, non-tender; bowel sounds normal; no masses,  no organomegaly Extremities: extremities normal, atraumatic, no cyanosis or edema Neurologic: Alert and oriented X 3, normal strength and tone. Normal symmetric reflexes. Normal coordination and gait Right breast is examined today she is noted to have a palpable mass without any nipple inversion or retraction.  ECOG PERFORMANCE STATUS: 1 - Symptomatic but completely ambulatory  Blood pressure 131/79, pulse 50, temperature 98.4 F (36.9 C), temperature source Oral, height 5' 8"  (1.727 m), weight 163 lb 9.6 oz (74.208 kg).  LABORATORY DATA: Lab Results  Component Value Date   WBC 3.2* 04/03/2011   HGB 12.9 04/03/2011   HCT 38.1 04/03/2011   MCV 96.5 04/03/2011   PLT 326 04/03/2011      Chemistry      Component Value Date/Time   NA 139 03/28/2011 1046   K 4.2 03/28/2011 1046   CL 101 03/28/2011 1046   CO2 28 03/28/2011 1046   BUN 18 03/28/2011 1046   CREATININE 0.80 03/28/2011 1046      Component Value Date/Time   CALCIUM 9.3 03/28/2011 1046   ALKPHOS 50 03/28/2011 1046   AST 16 03/28/2011 1046   ALT 13 03/28/2011 1046   BILITOT 0.3 03/28/2011 1046       RADIOGRAPHIC STUDIES:  No results found.  ASSESSMENT: 51 year old female with:  1.  stage III breast cancer s/p mastectomy of the left breast which showed a 5 cm mass with 6 positive lymph nodes lobular features.  2.  Patient is status post 4 cycles of dose dense Adriamycin and Cytoxan which she tolerated very well.   3. She is now receiving weekly Taxol.  4.  She will proceed with week #10 of 12 planned Taxol.  5. Post mastectomy radiation therapy evaluation referral to Dr. Sondra Come  PLAN:  1.  patient will continue to see [place] on a weekly basis during the duration of her taxane therapy.  2.  She  knows to call us with any problems questions or concerns.  3.  She will not take anymore premedication with Decadron at home. Her Decadron will be given as an IV prior to her getting the Taxol we will reduce the steroids to 12 mg iv.  4. Referral to Radiation Oncology in the next week or two to begin planning for post mastectomy radiation  All questions were answered. The patient knows to call the clinic with any problems, questions or concerns. We can certainly see the patient much sooner if necessary.  I spent >25 minutes counseling and coordination of care. The total time spent in the appointment was 30 minutes.    Marcy Panning, MD Medical/Oncology [place]  [place] [phone] (beeper) [phone] (Office)  04/03/2011, 12:42 PM

## 2011-04-03 NOTE — Telephone Encounter (Signed)
S/w the pt and she is aware of the appt to see dr Sondra Come in march

## 2011-04-03 NOTE — Patient Instructions (Addendum)
1. You are doing well.  2. You will proceed with your chemotherapy on 04/04/11.  3. You will be seen back again on 3/14 with labs/NP visit/and chemo all on the same day.  4. Referral back to Dr. Sondra Come has been made and his office should be calling.

## 2011-04-04 ENCOUNTER — Ambulatory Visit (HOSPITAL_BASED_OUTPATIENT_CLINIC_OR_DEPARTMENT_OTHER): Payer: Medicare Other

## 2011-04-04 ENCOUNTER — Other Ambulatory Visit: Payer: Medicare Other | Admitting: Lab

## 2011-04-04 ENCOUNTER — Ambulatory Visit: Payer: Medicare Other | Admitting: Oncology

## 2011-04-04 VITALS — BP 121/74 | HR 50 | Temp 97.5°F

## 2011-04-04 DIAGNOSIS — C50419 Malignant neoplasm of upper-outer quadrant of unspecified female breast: Secondary | ICD-10-CM

## 2011-04-04 DIAGNOSIS — Z5111 Encounter for antineoplastic chemotherapy: Secondary | ICD-10-CM

## 2011-04-04 MED ORDER — SODIUM CHLORIDE 0.9 % IV SOLN
Freq: Once | INTRAVENOUS | Status: AC
  Administered 2011-04-04: 12:00:00 via INTRAVENOUS

## 2011-04-04 MED ORDER — DEXAMETHASONE SODIUM PHOSPHATE 4 MG/ML IJ SOLN
12.0000 mg | Freq: Once | INTRAMUSCULAR | Status: AC
  Administered 2011-04-04: 12 mg via INTRAVENOUS

## 2011-04-04 MED ORDER — FAMOTIDINE IN NACL 20-0.9 MG/50ML-% IV SOLN
20.0000 mg | Freq: Once | INTRAVENOUS | Status: AC
  Administered 2011-04-04: 20 mg via INTRAVENOUS

## 2011-04-04 MED ORDER — ONDANSETRON 8 MG/50ML IVPB (CHCC)
8.0000 mg | Freq: Once | INTRAVENOUS | Status: AC
  Administered 2011-04-04: 8 mg via INTRAVENOUS

## 2011-04-04 MED ORDER — DIPHENHYDRAMINE HCL 50 MG/ML IJ SOLN
50.0000 mg | Freq: Once | INTRAMUSCULAR | Status: AC
  Administered 2011-04-04: 50 mg via INTRAVENOUS

## 2011-04-04 MED ORDER — SODIUM CHLORIDE 0.9 % IJ SOLN
10.0000 mL | INTRAMUSCULAR | Status: DC | PRN
  Administered 2011-04-04: 10 mL
  Filled 2011-04-04: qty 10

## 2011-04-04 MED ORDER — PACLITAXEL CHEMO INJECTION 300 MG/50ML
80.0000 mg/m2 | Freq: Once | INTRAVENOUS | Status: AC
  Administered 2011-04-04: 150 mg via INTRAVENOUS
  Filled 2011-04-04: qty 25

## 2011-04-04 MED ORDER — HEPARIN SOD (PORK) LOCK FLUSH 100 UNIT/ML IV SOLN
500.0000 [IU] | Freq: Once | INTRAVENOUS | Status: AC | PRN
  Administered 2011-04-04: 500 [IU]
  Filled 2011-04-04: qty 5

## 2011-04-11 ENCOUNTER — Ambulatory Visit (HOSPITAL_BASED_OUTPATIENT_CLINIC_OR_DEPARTMENT_OTHER): Payer: Medicare Other

## 2011-04-11 ENCOUNTER — Ambulatory Visit: Payer: Medicare Other | Admitting: Physician Assistant

## 2011-04-11 ENCOUNTER — Other Ambulatory Visit (HOSPITAL_BASED_OUTPATIENT_CLINIC_OR_DEPARTMENT_OTHER): Payer: Medicare Other | Admitting: Lab

## 2011-04-11 ENCOUNTER — Telehealth: Payer: Self-pay | Admitting: Oncology

## 2011-04-11 ENCOUNTER — Ambulatory Visit (HOSPITAL_BASED_OUTPATIENT_CLINIC_OR_DEPARTMENT_OTHER): Payer: Medicare Other | Admitting: Family

## 2011-04-11 ENCOUNTER — Encounter: Payer: Self-pay | Admitting: Family

## 2011-04-11 VITALS — BP 130/78 | HR 47 | Temp 97.2°F

## 2011-04-11 VITALS — BP 138/84 | HR 50 | Temp 98.0°F | Ht 68.0 in | Wt 164.3 lb

## 2011-04-11 DIAGNOSIS — Z170 Estrogen receptor positive status [ER+]: Secondary | ICD-10-CM

## 2011-04-11 DIAGNOSIS — C50419 Malignant neoplasm of upper-outer quadrant of unspecified female breast: Secondary | ICD-10-CM

## 2011-04-11 DIAGNOSIS — C50919 Malignant neoplasm of unspecified site of unspecified female breast: Secondary | ICD-10-CM

## 2011-04-11 DIAGNOSIS — Z9010 Acquired absence of unspecified breast and nipple: Secondary | ICD-10-CM

## 2011-04-11 DIAGNOSIS — R5381 Other malaise: Secondary | ICD-10-CM

## 2011-04-11 DIAGNOSIS — Z5111 Encounter for antineoplastic chemotherapy: Secondary | ICD-10-CM

## 2011-04-11 LAB — CBC WITH DIFFERENTIAL/PLATELET
BASO%: 0.3 % (ref 0.0–2.0)
Basophils Absolute: 0 10*3/uL (ref 0.0–0.1)
EOS%: 0.8 % (ref 0.0–7.0)
HCT: 38.3 % (ref 34.8–46.6)
MCH: 31.9 pg (ref 25.1–34.0)
MCHC: 34.2 g/dL (ref 31.5–36.0)
MCV: 93.2 fL (ref 79.5–101.0)
MONO%: 7.7 % (ref 0.0–14.0)
NEUT%: 65.6 % (ref 38.4–76.8)
lymph#: 1 10*3/uL (ref 0.9–3.3)

## 2011-04-11 LAB — COMPREHENSIVE METABOLIC PANEL
AST: 16 U/L (ref 0–37)
Albumin: 4.4 g/dL (ref 3.5–5.2)
Alkaline Phosphatase: 51 U/L (ref 39–117)
BUN: 17 mg/dL (ref 6–23)
Creatinine, Ser: 0.76 mg/dL (ref 0.50–1.10)
Glucose, Bld: 82 mg/dL (ref 70–99)
Potassium: 3.7 mEq/L (ref 3.5–5.3)
Total Bilirubin: 0.3 mg/dL (ref 0.3–1.2)

## 2011-04-11 MED ORDER — SODIUM CHLORIDE 0.9 % IJ SOLN
10.0000 mL | INTRAMUSCULAR | Status: DC | PRN
  Administered 2011-04-11: 10 mL
  Filled 2011-04-11: qty 10

## 2011-04-11 MED ORDER — ONDANSETRON 8 MG/50ML IVPB (CHCC)
8.0000 mg | Freq: Once | INTRAVENOUS | Status: AC
  Administered 2011-04-11: 8 mg via INTRAVENOUS

## 2011-04-11 MED ORDER — FAMOTIDINE IN NACL 20-0.9 MG/50ML-% IV SOLN
20.0000 mg | Freq: Once | INTRAVENOUS | Status: AC
  Administered 2011-04-11: 20 mg via INTRAVENOUS

## 2011-04-11 MED ORDER — PACLITAXEL CHEMO INJECTION 300 MG/50ML
80.0000 mg/m2 | Freq: Once | INTRAVENOUS | Status: AC
  Administered 2011-04-11: 150 mg via INTRAVENOUS
  Filled 2011-04-11: qty 25

## 2011-04-11 MED ORDER — DEXAMETHASONE SODIUM PHOSPHATE 4 MG/ML IJ SOLN
12.0000 mg | Freq: Once | INTRAMUSCULAR | Status: AC
  Administered 2011-04-11: 12 mg via INTRAVENOUS

## 2011-04-11 MED ORDER — HEPARIN SOD (PORK) LOCK FLUSH 100 UNIT/ML IV SOLN
500.0000 [IU] | Freq: Once | INTRAVENOUS | Status: AC | PRN
  Administered 2011-04-11: 500 [IU]
  Filled 2011-04-11: qty 5

## 2011-04-11 MED ORDER — DIPHENHYDRAMINE HCL 50 MG/ML IJ SOLN
50.0000 mg | Freq: Once | INTRAMUSCULAR | Status: AC
  Administered 2011-04-11: 50 mg via INTRAVENOUS

## 2011-04-11 MED ORDER — SODIUM CHLORIDE 0.9 % IV SOLN: Freq: Once | INTRAVENOUS | Status: DC

## 2011-04-11 NOTE — Progress Notes (Signed)
[place]  Name: Vanessa Mills                  DATE: 04/11/2011 MRN: [id number]                      DOB: [birth year]  CC: [name], MD         [name], MD.   REFERRING PHYSICIAN: [name], MD  DIAGNOSIS: Patient Active Problem List  Diagnoses Date Noted  . Bipolar disorder 01/30/2011    Priority: Medium  . Chemotherapy follow-up examination 12/28/2010  . Fatigue 12/28/2010  . Breast cancer, ILC, Left, receptor+, Her2- 10/10/2010     Encounter Diagnosis  Name Primary?  . 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 Final pathology showed a 5 cm invasive ductal carcinoma with lobular features. Six nodes were positive for metastatic disease, ER/PR+, HER-2/neu negative with a proliferation marker of 12%.   PRIOR THERAPY:  1. Left mastectomy with axillary lymph node dissection, invasive lobular cancer measuring 6 x 8 cm with 6 positive lymph nodes.  2. Adjuvant chemotherapy, completed Adriamycin and Cytoxan given dose dense every 2 weeks for 4 cycles.    CURRENT THERAPY: Weekly Taxol X 12, this will be week 11. (01/31/11 - 04/18/11).   INTERIM HISTORY:  No nausea or vomiting. Bowel and bladder function normal. Appetite is good. No headache or blurred vision, no cough or dyspnea, no abdominal pain, no new bone pain. Is happy to report virtually no side effects from the chemo, other than fatigue, which has improved since completing AC. No pain.   Has appt with Dr. Sondra Come Monday to discuss radiation therapy. Last Taxol will be next week, 3/21. She is happy to be completing chemo and pleased she has tolerated it so well.   PHYSICAL EXAM: BP 138/84  Pulse 50  Temp(Src) 98 F (36.7 C) (Oral)  Ht 5' 8"  (1.727 m)  Wt 164 lb 4.8 oz (74.526 kg)  BMI 24.98 kg/m2 General: Well developed, well nourished, white  female in no acute distress. Alone at today's visit. EENT: No ocular or oral lesions. No stomatitis.  Respiratory: Lungs are clear to auscultation bilaterally with normal respiratory movement and no accessory muscle use. Cardiac: No murmur, rub or tachycardia. No upper or lower extremity edema or tenderness.  GI: Abdomen is soft, no palpable hepatosplenomegaly. No fluid wave. No tenderness. Musculoskeletal: No kyphosis, no tenderness over the spine, ribs or hips. Lymph: No cervical, infraclavicular, axillary or inguinal adenopathy. Neuro: No focal neurological deficits. Psych: Alert and oriented X 3, upbeat and animated.   SOCIAL HISTORY:   Social History  . Marital Status: Married   Social History Main Topics  . Smoking status: Never Smoker   . Alcohol Use: 1.7 oz/week    1 Drinks containing 0.5 oz of alcohol, 2 Glasses of wine per week  . Drug Use: Yes    Special: Marijuana     in college   Social History Narrative   Married, One dtr- 51 years old- [name]- in [place]:   Results for orders placed in visit on 04/11/11  CBC WITH DIFFERENTIAL      Component Value Range   WBC 3.8 (*) 3.9 - 10.3 (10e3/uL)   NEUT# 2.5  1.5 - 6.5 (10e3/uL)   HGB 13.1  11.6 - 15.9 (g/dL)  HCT 38.3  34.8 - 46.6 (%)   Platelets 283  145 - 400 (10e3/uL)   MCV 93.2  79.5 - 101.0 (fL)   MCH 31.9  25.1 - 34.0 (pg)   MCHC 34.2  31.5 - 36.0 (g/dL)   RBC 4.11  3.70 - 5.45 (10e6/uL)   RDW 12.3  11.2 - 14.5 (%)   lymph# 1.0  0.9 - 3.3 (10e3/uL)   MONO# 0.3  0.1 - 0.9 (10e3/uL)   Eosinophils Absolute 0.0  0.0 - 0.5 (10e3/uL)   Basophils Absolute 0.0  0.0 - 0.1 (10e3/uL)   NEUT% 65.6  38.4 - 76.8 (%)   LYMPH% 25.6  14.0 - 49.7 (%)   MONO% 7.7  0.0 - 14.0 (%)   EOS% 0.8  0.0 - 7.0 (%)   BASO% 0.3  0.0 - 2.0 (%)   nRBC 0  0 - 0 (%)    IMPRESSION:  51 year old white female with: 1. Stage IIIA invasive lobular carcinoma, left breast. Status post left mastectomy, completed 3  cycles of dose dense Adriamycin/Cytoxan with good tolerance. Now on cycle 9 of Taxol (12 planned).  2. Fatigue, self-limited, related to chemotherapy. 3. Diagnosis of bipolar disorder, managed by others.  4. Labs OK to treat.   PLAN:   1.Treatment with cycle 11 weekly Taxol today. Next week will be last treatment. She will see Dr. Humphrey Rolls next week.  2. Keep appt with Dr. Sondra Come for Monday.  3. Labs orders are entered.

## 2011-04-11 NOTE — Telephone Encounter (Signed)
gve the pt her updated march 2013 appt calendar

## 2011-04-12 NOTE — Progress Notes (Signed)
For consideration of post mastectomy and chemotherapy radiation planning of left breast lobular ca stage III A. 6 +nodes Chemotherapy regime  consisted of Adriamycin/Cytoxan and Taxol.

## 2011-04-15 ENCOUNTER — Ambulatory Visit
Admission: RE | Admit: 2011-04-15 | Discharge: 2011-04-15 | Disposition: A | Discharge status: Home / Self Care | Payer: Medicare Other | Source: Ambulatory Visit | Attending: Radiation Oncology | Admitting: Radiation Oncology

## 2011-04-15 ENCOUNTER — Encounter: Payer: Self-pay | Admitting: Radiation Oncology

## 2011-04-15 VITALS — BP 108/70 | HR 50 | Temp 98.3°F | Wt 166.0 lb

## 2011-04-15 DIAGNOSIS — C50419 Malignant neoplasm of upper-outer quadrant of unspecified female breast: Secondary | ICD-10-CM

## 2011-04-15 DIAGNOSIS — Z510 Encounter for antineoplastic radiation therapy: Secondary | ICD-10-CM | POA: Insufficient documentation

## 2011-04-15 HISTORY — DX: Bipolar disorder, unspecified: F31.9

## 2011-04-15 NOTE — Progress Notes (Signed)
CC:   Vanessa Mills, M.D. Haywood Lasso, M.D. Carola J. Jonny Ruiz, M.D.  REFERRING PHYSICIAN:  Marcy Mills, M.D.  DIAGNOSIS:  Stage IIIA (pT2 pN2a M0).  HISTORY OF PRESENT ILLNESS:  Vanessa Mills is a very pleasant 51 year old female who is seen out of the courtesy of Dr. Humphrey Rolls for consideration for radiation therapy as part of the management of the patient's locally advanced left breast cancer.  Vanessa Mills was seen by myself in the Multidisciplinary Breast Clinic on October 10, 2010.  The patient at that time was felt to have a large mass within the upper outer aspect of the left breast.  On MRI this area measured approximately 5.9 x 2.4 x 5.4 cm.  On clinical exam, a palpable mass was noted on my exam September 12th in the lateral aspect of the left breast measuring approximately 5 to 6 cm.  Given the patient's desire and size of her lesion, the patient elected to proceed with left modified radical mastectomy.  This was performed on October 24, 2010.  The patient's sentinel node procedure showed 4 out of 4 lymph nodes involved with metastatic disease.  In light of this, the patient underwent a completion axillary dissection with 1 additional node positive for metastatic disease with 5 benign remaining lymph nodes.  At the time of the patient's mastectomy, the tumor size within the left breast was 5 cm.  The tumor was estrogen receptor positive at 82% and progesterone receptor positive at 12%.  Ki-67 was low at 12%.  There was no HER-2/neu amplification.  The patient had 6 out of 11 lymph nodes involved within the left axilla.  In summary, the patient was found to have a stage IIIA grade 2 invasive mammary carcinoma with ductal and lobular features.  In light of the above findings, the patient proceeded to undergo adjuvant chemotherapy.  The patient was initially treated with 4 cycles of AC in a q.2-week session.  The patient also received adjuvant Taxol and has completed 10  out of 12 planned Taxol treatments.  The patient is now seen in Radiation Oncology for further evaluation and for discussion of postmastectomy irradiation.  REVIEW OF SYSTEMS:  Overall the patient has tolerated her chemotherapy quite well.  She denies any pain along the left chest wall, problems with swelling in her left arm or hand, or numbness or tingling.  The patient denies any new bony pain, headaches, dizziness, or blurred vision.  PHYSICAL EXAMINATION:  General:  This is a very pleasant middle-aged female in no acute distress.  She is accompanied by her husband on evaluation today.  Vital Signs:  Temperature 98.3, pulse 50, blood pressure 108/70, weight is 166 pounds.  Lungs:  Examination of the lungs reveals them to be clear.  Heart:  Has a regular rhythm and rate. Lymph:  Examination of the neck and supraclavicular region reveals no evidence of adenopathy.  The axillary areas are free of adenopathy. Breasts:  Examination of the right breast reveals no mass or nipple discharge.  The patient has a Port-A-Cath in place in the right upper chest.  Examination of the left chest wall area reveals a well-healed mastectomy scar without palpable or visible signs of recurrence.  IMPRESSION AND PLAN:  Locally advanced invasive mammary carcinoma (T2 N2a).  Given the stage of the patient's malignancy, as well as significant axillary involvement, I do feel she would be at risk for local regional recurrence and would recommend postmastectomy irradiation for this patient.  Radiation fields would encompass  the left chest wall, high axilla, and supraclavicular region.  I discussed the overall treatment course, side effects, and potential toxicities of radiation therapy in this situation with Vanessa Mills.  She appears to understand and wishes to proceed with planned course of treatment.  The patient will be set up for CT simulation on April 9th, which should allow adequate recovery time from the  patient's last cycle of adjuvant Taxol.  I anticipate 5 weeks of radiation therapy directed at the left chest wall, high axilla, and supraclavicular region.  The mastectomy scar area will be boosted further to a cumulative dose of approximately 6000 cGy.  I discussed the overall treatment course, side effects, and potential toxicities of radiation therapy in this situation with Vanessa Mills and her husband.  The patient appears to understand and wishes to proceed with postmastectomy irradiation as part of her overall management.    ______________________________ Blair Promise, Ph.D., M.D. JDK/MEDQ  D:  04/15/2011  T:  04/15/2011  Job:  [id number]

## 2011-04-15 NOTE — Progress Notes (Signed)
Please see the Nurse Progress Note in the MD Initial Consult Encounter for this patient. 

## 2011-04-18 ENCOUNTER — Telehealth: Payer: Self-pay | Admitting: Oncology

## 2011-04-18 ENCOUNTER — Ambulatory Visit (HOSPITAL_BASED_OUTPATIENT_CLINIC_OR_DEPARTMENT_OTHER): Payer: Medicare Other

## 2011-04-18 ENCOUNTER — Ambulatory Visit: Payer: Medicare Other | Admitting: Physician Assistant

## 2011-04-18 ENCOUNTER — Other Ambulatory Visit (HOSPITAL_BASED_OUTPATIENT_CLINIC_OR_DEPARTMENT_OTHER): Payer: Medicare Other | Admitting: Lab

## 2011-04-18 ENCOUNTER — Ambulatory Visit (HOSPITAL_BASED_OUTPATIENT_CLINIC_OR_DEPARTMENT_OTHER): Payer: Medicare Other | Admitting: Oncology

## 2011-04-18 ENCOUNTER — Encounter: Payer: Self-pay | Admitting: Oncology

## 2011-04-18 VITALS — BP 130/81 | HR 54 | Temp 98.0°F | Ht 68.0 in | Wt 168.8 lb

## 2011-04-18 DIAGNOSIS — R5381 Other malaise: Secondary | ICD-10-CM

## 2011-04-18 DIAGNOSIS — C50919 Malignant neoplasm of unspecified site of unspecified female breast: Secondary | ICD-10-CM

## 2011-04-18 DIAGNOSIS — C50419 Malignant neoplasm of upper-outer quadrant of unspecified female breast: Secondary | ICD-10-CM

## 2011-04-18 DIAGNOSIS — Z09 Encounter for follow-up examination after completed treatment for conditions other than malignant neoplasm: Secondary | ICD-10-CM

## 2011-04-18 DIAGNOSIS — Z5111 Encounter for antineoplastic chemotherapy: Secondary | ICD-10-CM

## 2011-04-18 DIAGNOSIS — Z170 Estrogen receptor positive status [ER+]: Secondary | ICD-10-CM

## 2011-04-18 LAB — COMPREHENSIVE METABOLIC PANEL WITH GFR
ALT: 11 U/L (ref 0–35)
AST: 17 U/L (ref 0–37)
Albumin: 4.2 g/dL (ref 3.5–5.2)
Alkaline Phosphatase: 48 U/L (ref 39–117)
BUN: 14 mg/dL (ref 6–23)
CO2: 29 meq/L (ref 19–32)
Calcium: 9.4 mg/dL (ref 8.4–10.5)
Chloride: 102 meq/L (ref 96–112)
Creatinine, Ser: 0.79 mg/dL (ref 0.50–1.10)
Glucose, Bld: 83 mg/dL (ref 70–99)
Potassium: 4 meq/L (ref 3.5–5.3)
Sodium: 140 meq/L (ref 135–145)
Total Bilirubin: 0.4 mg/dL (ref 0.3–1.2)
Total Protein: 6.4 g/dL (ref 6.0–8.3)

## 2011-04-18 LAB — CBC WITH DIFFERENTIAL/PLATELET
BASO%: 0.5 % (ref 0.0–2.0)
EOS%: 1 % (ref 0.0–7.0)
HCT: 38.3 % (ref 34.8–46.6)
LYMPH%: 28.4 % (ref 14.0–49.7)
MCH: 31.6 pg (ref 25.1–34.0)
MCHC: 33.9 g/dL (ref 31.5–36.0)
MONO#: 0.2 10*3/uL (ref 0.1–0.9)
MONO%: 5.5 % (ref 0.0–14.0)
NEUT%: 64.6 % (ref 38.4–76.8)
Platelets: 328 10*3/uL (ref 145–400)
RBC: 4.11 10*6/uL (ref 3.70–5.45)
WBC: 3.8 10*3/uL — ABNORMAL LOW (ref 3.9–10.3)

## 2011-04-18 MED ORDER — FAMOTIDINE IN NACL 20-0.9 MG/50ML-% IV SOLN
20.0000 mg | Freq: Once | INTRAVENOUS | Status: AC
  Administered 2011-04-18: 20 mg via INTRAVENOUS

## 2011-04-18 MED ORDER — SODIUM CHLORIDE 0.9 % IV SOLN
Freq: Once | INTRAVENOUS | Status: AC
  Administered 2011-04-18: 12:00:00 via INTRAVENOUS

## 2011-04-18 MED ORDER — DIPHENHYDRAMINE HCL 50 MG/ML IJ SOLN
50.0000 mg | Freq: Once | INTRAMUSCULAR | Status: AC
  Administered 2011-04-18: 50 mg via INTRAVENOUS

## 2011-04-18 MED ORDER — HEPARIN SOD (PORK) LOCK FLUSH 100 UNIT/ML IV SOLN
500.0000 [IU] | Freq: Once | INTRAVENOUS | Status: AC | PRN
  Administered 2011-04-18: 500 [IU]
  Filled 2011-04-18: qty 5

## 2011-04-18 MED ORDER — PACLITAXEL CHEMO INJECTION 300 MG/50ML
80.0000 mg/m2 | Freq: Once | INTRAVENOUS | Status: AC
  Administered 2011-04-18: 150 mg via INTRAVENOUS
  Filled 2011-04-18: qty 25

## 2011-04-18 MED ORDER — SODIUM CHLORIDE 0.9 % IJ SOLN
10.0000 mL | INTRAMUSCULAR | Status: DC | PRN
  Administered 2011-04-18: 10 mL
  Filled 2011-04-18: qty 10

## 2011-04-18 MED ORDER — DEXAMETHASONE SODIUM PHOSPHATE 4 MG/ML IJ SOLN
12.0000 mg | Freq: Once | INTRAMUSCULAR | Status: AC
  Administered 2011-04-18: 12 mg via INTRAVENOUS

## 2011-04-18 MED ORDER — ONDANSETRON 8 MG/50ML IVPB (CHCC)
8.0000 mg | Freq: Once | INTRAVENOUS | Status: AC
  Administered 2011-04-18: 8 mg via INTRAVENOUS

## 2011-04-18 NOTE — Progress Notes (Signed)
OFFICE PROGRESS NOTE  CC  [name], MD  [name], MD, MD [place] [address]  DIAGNOSIS: 51 year old female with:  1.  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  PRIOR THERAPY:  #1 status post mastectomy with axillary lymph node dissection for a stage IIIa invasive lobular carcinoma measuring 6 x 8 cm with 6 positive lymph nodes.  #2 S/P 4 cycles of AC q 2 weeks with day 2 neulasta  #3 she will then begin weekly Taxol starting on 01/31/2011 for a total of 12 weeks. Completed 04/18/11  CURRENT THERAPY: patient is here for week 12 of 12 Taxol  INTERVAL HISTORY: Vanessa Mills 51 y.o. female returns for followup visit today. Overall she is doing well. She has a little bit of fatigue but no paresthesias. She denies any fevers or chills headaches, myalgias or arthralgias. No tingling or numbness. She has been eating well, no disturbances in her sleep patterns. No bowel or bladder habits.No bruising or bleeding. Remainder of the 10 point review of systems is negative.  MEDICAL HISTORY: Past Medical History  Diagnosis Date  . Chronic mental illness   . Meniere's syndrome   . Crohn's disease   . PONV (postoperative nausea and vomiting)   . Raynaud's disease   . GERD (gastroesophageal reflux disease)     does not take medications for   . Headache     takes midodrine for migraines prn  . Breast cancer, ILC, Left, receptor+, Her2- 10/10/2010  . Mental disorder     bipolar, takes saphris at hs  . Chemotherapy follow-up examination 12/28/2010  . Fatigue 12/28/2010  . Breast cancer 03/07/2011  .  Bipolar 1 disorder     ALLERGIES:   has no known allergies.  MEDICATIONS:  Current Outpatient Prescriptions  Medication Sig Dispense Refill  . ALPRAZolam (XANAX) 1 MG tablet Take 1 mg by mouth daily as needed.        . AMBULATORY NON FORMULARY MEDICATION Medication Name: MAGIC MOUTHWASH 2 % Viscous Lidocaine  Maalox Benadryl Susp.  Disp: 1:1:1 272m bottle Sig: 139mPO Swish & Spit  q 3-4hrs. PRN mouth sores  200 mL  3  . asenapine (SAPHRIS) 5 MG SUBL Place 10 mg under the tongue at bedtime.       . diazepam (VALIUM) 10 MG tablet as needed.       . docusate sodium (COLACE) 100 MG capsule Take 100 mg by mouth as needed.        . isometheptene-acetaminophen-dichloralphenazone (MIDRIN) 65-325-100 MG capsule as needed.       . Multiple Vitamin (MULTIVITAMIN) capsule Take 1 capsule by mouth daily.        . propranolol (INDERAL) 60 MG tablet Take 60 mg by mouth 2 (two) times daily.        . Marland Kitchenriamterene-hydrochlorothiazide (DYAZIDE) 37.5-25 MG per capsule Take 1 capsule by mouth every morning.        . zolpidem (AMBIEN) 10 MG tablet Take 1 tablet (10 mg total) by mouth at bedtime as needed.  30 tablet  0    SURGICAL HISTORY:  Past Surgical History  Procedure Date  . Bladder tuck   .  2 orbital fracture surgeries   . Sinus exploration   . Bladder suspension     done 2005  . Mastectomy modified radical 10/23/2010    Left Dr Margot Chimes  . Portacath placement 11/28/2010    via right subclavian - Dr Margot Chimes  . Abdominal hysterectomy     uterus removed only    REVIEW OF SYSTEMS:  A comprehensive review of systems was negative.   PHYSICAL EXAMINATION: General appearance: alert, cooperative and appears stated age Head: Normocephalic, without obvious abnormality, atraumatic Neck: no adenopathy, no carotid bruit, no JVD, supple, symmetrical, trachea midline and thyroid not enlarged, symmetric, no tenderness/mass/nodules Lymph nodes: Cervical, supraclavicular, and axillary nodes normal. Resp:  clear to auscultation bilaterally and normal percussion bilaterally Back: symmetric, no curvature. ROM normal. No CVA tenderness. Cardio: regular rate and rhythm, S1, S2 normal, no murmur, click, rub or gallop and normal apical impulse GI: soft, non-tender; bowel sounds normal; no masses,  no organomegaly Extremities: extremities normal, atraumatic, no cyanosis or edema Neurologic: Alert and oriented X 3, normal strength and tone. Normal symmetric reflexes. Normal coordination and gait Right breast is examined today she is noted to have a palpable mass without any nipple inversion or retraction.  ECOG PERFORMANCE STATUS: 1 - Symptomatic but completely ambulatory  Blood pressure 130/81, pulse 54, temperature 98 F (36.7 C), temperature source Oral, height 5' 8"  (1.727 m), weight 168 lb 12.8 oz (76.567 kg).  LABORATORY DATA: Lab Results  Component Value Date   WBC 3.8* 04/18/2011   HGB 13.0 04/18/2011   HCT 38.3 04/18/2011   MCV 93.2 04/18/2011   PLT 328 04/18/2011      Chemistry      Component Value Date/Time   NA 138 04/11/2011 1038   K 3.7 04/11/2011 1038   CL 100 04/11/2011 1038   CO2 28 04/11/2011 1038   BUN 17 04/11/2011 1038   CREATININE 0.76 04/11/2011 1038      Component Value Date/Time   CALCIUM 9.6 04/11/2011 1038   ALKPHOS 51 04/11/2011 1038   AST 16 04/11/2011 1038   ALT 12 04/11/2011 1038   BILITOT 0.3 04/11/2011 1038       RADIOGRAPHIC STUDIES:  No results found.  ASSESSMENT: 51 year old female with:  1.  stage III breast cancer s/p mastectomy of the left breast which showed a 5 cm mass with 6 positive lymph nodes lobular features.  2.  Patient is status post 4 cycles of dose dense Adriamycin and Cytoxan which she tolerated very well.   3. She is now receiving weekly Taxol.  4.  She will proceed with week #12 of 12 planned Taxol. Completing all of her therapy today.  5. Post mastectomy radiation therapy to begin on 4/9 with Dr. Sondra Come  PLAN:  1.Proceed with  final dose of taxol today.  2. She will proceed to radiation therapy as planned by Dr. Sondra Come.  3. I will see her back in 1 months time, she does not need to be seen by [place] next week as she is doing well.  4. Patient knows to call with any questions or concerns in the meantime.   All questions were answered. The patient knows to call the clinic with any problems, questions or concerns. We can certainly see the patient much sooner if necessary.  I spent >25 minutes counseling and coordination of care. The total time spent in the appointment was 30 minutes.    Marcy Panning, MD Medical/Oncology [facility] [phone] (beeper) [phone] (  Office)  04/18/2011, 11:20 AM

## 2011-04-18 NOTE — Telephone Encounter (Signed)
gve the pt her April 2013 appt calendar

## 2011-04-18 NOTE — Patient Instructions (Signed)
1. You are done with chemotherapy CONGRATULATIONS!!!!!!!  2. I will see you back in 1 moth with labs.

## 2011-04-18 NOTE — Patient Instructions (Signed)
[facility] Discharge Instructions for Patients Receiving Chemotherapy  Today you received the following chemotherapy agents  Taxol.  To help prevent nausea and vomiting after your treatment, we encourage you to take your nausea medication as prescribed by your physician.  If you develop nausea and vomiting that is not controlled by your nausea medication, call the clinic. If it is after clinic hours your family physician or the after hours number for the clinic or go to the Emergency Department.   BELOW ARE SYMPTOMS THAT SHOULD BE REPORTED IMMEDIATELY:  *FEVER GREATER THAN 100.5 F  *CHILLS WITH OR WITHOUT FEVER  NAUSEA AND VOMITING THAT IS NOT CONTROLLED WITH YOUR NAUSEA MEDICATION  *UNUSUAL SHORTNESS OF BREATH  *UNUSUAL BRUISING OR BLEEDING  TENDERNESS IN MOUTH AND THROAT WITH OR WITHOUT PRESENCE OF ULCERS  *URINARY PROBLEMS  *BOWEL PROBLEMS  UNUSUAL RASH Items with * indicate a potential emergency and should be followed up as soon as possible.  Feel free to call the clinic you have any questions or concerns. The clinic phone number is [phone].   I have been informed and understand all the instructions given to me. I know to contact the clinic, my physician, or go to the Emergency Department if any problems should occur. I do not have any questions at this time, but understand that I may call the clinic during office hours   should I have any questions or need assistance in obtaining follow up care.    __________________________________________  _____________  __________ Signature of Patient or Authorized Representative            Date                   Time    __________________________________________ Nurse's Signature

## 2011-04-25 ENCOUNTER — Other Ambulatory Visit: Payer: Medicare Other | Admitting: Lab

## 2011-04-25 ENCOUNTER — Ambulatory Visit: Payer: Medicare Other

## 2011-04-25 ENCOUNTER — Ambulatory Visit: Payer: Medicare Other | Admitting: Physician Assistant

## 2011-04-25 ENCOUNTER — Ambulatory Visit: Payer: Medicare Other | Admitting: Family

## 2011-05-07 ENCOUNTER — Ambulatory Visit
Admission: RE | Admit: 2011-05-07 | Discharge: 2011-05-07 | Disposition: A | Discharge status: Home / Self Care | Payer: Medicare Other | Source: Ambulatory Visit | Attending: Radiation Oncology | Admitting: Radiation Oncology

## 2011-05-07 ENCOUNTER — Encounter: Payer: Self-pay | Admitting: Radiation Oncology

## 2011-05-07 DIAGNOSIS — C50419 Malignant neoplasm of upper-outer quadrant of unspecified female breast: Secondary | ICD-10-CM

## 2011-05-07 NOTE — Progress Notes (Signed)
Name: Vanessa Mills   MRN: [id number]  Date:  05/07/2011  DOB: 1960-03-14  Status:outpatient  SIMULATION AND TREATMENT PLANNING NOTE   DIAGNOSIS: Breast cancer.  CONSENT VERIFIED: yes   SET UP: Patient is setup supine   IMMOBILIZATION:  The following immobilization was used:Custom Moldable Pillow, breast board.   NARRATIVE: [name] was brought to the [facility].  Identity was confirmed.  All relevant records and images related to the planned course of therapy were reviewed.  Then, the patient was positioned in a stable reproducible clinical set-up for radiation therapy.  Wires were placed to delineate the clinical extent of breast tissue. A wire was placed on the scar as well.  CT images were obtained.  An isocenter was placed. Skin markings were placed.  The position of the heart was then analyzed.  Due to the proximity of the heart to the chest wall, I felt she would benefit from deep inspiration breath hold for cardiac sparing.  She was then coached and rescanned in the breath hold position.  Acceptable cardiac sparing was achieved. The CT images were loaded into the planning software where the target and avoidance structures were contoured.  The radiation prescription was entered and confirmed. The patient was discharged in stable condition and tolerated simulation well.    TREATMENT PLANNING NOTE:  Treatment planning then occurred. I have requested : 3 MLC's, isodose plan, basic dose calculation the patient will receive 4500 cGy with her initial three-field setup. The patient will then proceed with an electron boost field for cumulative dose of 5940 cGy.

## 2011-05-07 NOTE — Progress Notes (Signed)
Met with patient to discuss RO billing.  Patient had no concerns today.

## 2011-05-14 ENCOUNTER — Ambulatory Visit
Admission: RE | Admit: 2011-05-14 | Discharge: 2011-05-14 | Disposition: A | Discharge status: Home / Self Care | Payer: Medicare Other | Source: Ambulatory Visit | Attending: Radiation Oncology | Admitting: Radiation Oncology

## 2011-05-14 DIAGNOSIS — C50419 Malignant neoplasm of upper-outer quadrant of unspecified female breast: Secondary | ICD-10-CM

## 2011-05-14 NOTE — Progress Notes (Signed)
  Radiation Oncology         [phone] ________________________________  Name: Vanessa Mills MRN: [id number]  Date: 05/14/2011  DOB: [birth year]  Simulation Verification Note  Status: outpatient  NARRATIVE: The patient was brought to the treatment unit and placed in the planned treatment position. The clinical setup was verified. Then port films were obtained and uploaded to the radiation oncology medical record software.  The treatment beams were carefully compared against the planned radiation fields. The position location and shape of the radiation fields was reviewed. They targeted volume of tissue appears to be appropriately covered by the radiation beams. Organs at risk appear to be excluded as planned.  Based on my personal review, I approved the simulation verification. The patient's treatment will proceed as planned.  -----------------------------------  Blair Promise, PhD, MD

## 2011-05-15 ENCOUNTER — Ambulatory Visit
Admission: RE | Admit: 2011-05-15 | Discharge: 2011-05-15 | Disposition: A | Discharge status: Home / Self Care | Payer: Medicare Other | Source: Ambulatory Visit | Attending: Radiation Oncology | Admitting: Radiation Oncology

## 2011-05-15 DIAGNOSIS — C50419 Malignant neoplasm of upper-outer quadrant of unspecified female breast: Secondary | ICD-10-CM

## 2011-05-15 MED ORDER — RADIAPLEXRX EX GEL: Freq: Once | CUTANEOUS | Status: DC

## 2011-05-15 NOTE — Progress Notes (Signed)
`  POST SIM TEACHING DONE WITH PATIENT AND HUSBAND, BOTH VERBALIZE UNDERSTANDING.   BOOKLET, RADIATION AND YOU GIVEN TO PATIENT WITH PERTINENT INFO MARKED.  RADIAPLEX GIVEN WITH INSTRUCTIONS FOR USE.

## 2011-05-16 ENCOUNTER — Ambulatory Visit
Admission: RE | Admit: 2011-05-16 | Discharge: 2011-05-16 | Disposition: A | Discharge status: Home / Self Care | Payer: Medicare Other | Source: Ambulatory Visit | Attending: Radiation Oncology | Admitting: Radiation Oncology

## 2011-05-17 ENCOUNTER — Ambulatory Visit
Admission: RE | Admit: 2011-05-17 | Discharge: 2011-05-17 | Disposition: A | Discharge status: Home / Self Care | Payer: Medicare Other | Source: Ambulatory Visit | Attending: Radiation Oncology | Admitting: Radiation Oncology

## 2011-05-17 ENCOUNTER — Telehealth: Payer: Self-pay | Admitting: *Deleted

## 2011-05-17 MED ORDER — CIPROFLOXACIN HCL 500 MG PO TABS: 500.0000 mg | ORAL_TABLET | Freq: Two times a day (BID) | ORAL | Status: AC

## 2011-05-17 MED ORDER — NYSTATIN 100000 UNIT/ML MT SUSP: 500000.0000 [IU] | Freq: Four times a day (QID) | OROMUCOSAL | Status: AC

## 2011-05-17 NOTE — Telephone Encounter (Signed)
Per MD, notified pt this could be due to radiation. Cipro and nystatin sent to pt's pharmacy.

## 2011-05-17 NOTE — Telephone Encounter (Signed)
Pt called states " sore throat on left side, terrible sore throat. It hurts to talk. " Pt denies fever, pt unsure if any white coating  to mouth b/c she "brushed her teeth with  Her finger, so tooth brush would not be infected. I had this before when I took chemo and Dr. Humphrey Rolls gave me cipro. Pt has radiation daily. Pt mentioned concerns to radiation, who recommended pt to call GP Will review with MD.

## 2011-05-17 NOTE — Telephone Encounter (Signed)
lewt patient this could be due to radiation but we can also call in cipro 500 mg BID x 7 days. She should also have Nystatin swish and swallow x 10 days.

## 2011-05-20 ENCOUNTER — Ambulatory Visit
Admission: RE | Admit: 2011-05-20 | Discharge: 2011-05-20 | Disposition: A | Discharge status: Home / Self Care | Payer: Medicare Other | Source: Ambulatory Visit | Attending: Radiation Oncology | Admitting: Radiation Oncology

## 2011-05-21 ENCOUNTER — Ambulatory Visit
Admission: RE | Admit: 2011-05-21 | Discharge: 2011-05-21 | Disposition: A | Discharge status: Home / Self Care | Payer: Medicare Other | Source: Ambulatory Visit | Attending: Radiation Oncology | Admitting: Radiation Oncology

## 2011-05-21 VITALS — Wt 166.2 lb

## 2011-05-21 DIAGNOSIS — C50419 Malignant neoplasm of upper-outer quadrant of unspecified female breast: Secondary | ICD-10-CM

## 2011-05-21 NOTE — Progress Notes (Signed)
Here for weekly routine weekly under treat visit of left chestwall, axilla and supraclavicular region. Started having sore throat on left side on Friday.started on cipro bid. Strep test per PCP negative. No visible changes of skin.Mild fatigue.

## 2011-05-21 NOTE — Progress Notes (Signed)
   Department of Radiation Oncology  Phone:  [phone] Fax:        [phone]   Weekly Management Note  Current Dose: 9.0   Gy  Projected Dose: 59.4 Gy   Narrative:  The patient presents for routine under treatment assessment.  Port film x-rays were reviewed.  The chart was checked. She is tolerating her radiation therapy well without any itching or discomfort in the treatment area. Last week the patient developed pharyngitis and was placed on Cipro which has helped this issue. Patient did have a strep test which was negative thru her primary care physician's office.  Physical Findings: Weight: 166 lb 3.2 oz (75.388 kg). Mild skin darkening along left chest wall.  Lungs clear. The throat shows some mild erythema without exudates.  Impression:  The patient is tolerating radiation.  Plan:  Continue treatment as planned.

## 2011-05-22 ENCOUNTER — Ambulatory Visit
Admission: RE | Admit: 2011-05-22 | Discharge: 2011-05-22 | Disposition: A | Discharge status: Home / Self Care | Payer: Medicare Other | Source: Ambulatory Visit | Attending: Radiation Oncology | Admitting: Radiation Oncology

## 2011-05-23 ENCOUNTER — Ambulatory Visit
Admission: RE | Admit: 2011-05-23 | Discharge: 2011-05-23 | Disposition: A | Discharge status: Home / Self Care | Payer: Medicare Other | Source: Ambulatory Visit | Attending: Radiation Oncology | Admitting: Radiation Oncology

## 2011-05-24 ENCOUNTER — Encounter: Payer: Self-pay | Admitting: Oncology

## 2011-05-24 ENCOUNTER — Ambulatory Visit
Admission: RE | Admit: 2011-05-24 | Discharge: 2011-05-24 | Disposition: A | Discharge status: Home / Self Care | Payer: Medicare Other | Source: Ambulatory Visit | Attending: Radiation Oncology | Admitting: Radiation Oncology

## 2011-05-24 ENCOUNTER — Other Ambulatory Visit (HOSPITAL_BASED_OUTPATIENT_CLINIC_OR_DEPARTMENT_OTHER): Payer: Medicare Other | Admitting: Lab

## 2011-05-24 ENCOUNTER — Ambulatory Visit (HOSPITAL_BASED_OUTPATIENT_CLINIC_OR_DEPARTMENT_OTHER): Payer: Medicare Other | Admitting: Oncology

## 2011-05-24 VITALS — BP 116/71 | HR 45 | Temp 98.6°F | Ht 68.0 in | Wt 165.4 lb

## 2011-05-24 DIAGNOSIS — Z170 Estrogen receptor positive status [ER+]: Secondary | ICD-10-CM

## 2011-05-24 DIAGNOSIS — C50419 Malignant neoplasm of upper-outer quadrant of unspecified female breast: Secondary | ICD-10-CM

## 2011-05-24 DIAGNOSIS — D235 Other benign neoplasm of skin of trunk: Secondary | ICD-10-CM

## 2011-05-24 DIAGNOSIS — R5383 Other fatigue: Secondary | ICD-10-CM

## 2011-05-24 DIAGNOSIS — R5381 Other malaise: Secondary | ICD-10-CM

## 2011-05-24 DIAGNOSIS — Z09 Encounter for follow-up examination after completed treatment for conditions other than malignant neoplasm: Secondary | ICD-10-CM

## 2011-05-24 LAB — COMPREHENSIVE METABOLIC PANEL
ALT: 10 U/L (ref 0–35)
Albumin: 4.4 g/dL (ref 3.5–5.2)
CO2: 32 mEq/L (ref 19–32)
Calcium: 9.9 mg/dL (ref 8.4–10.5)
Chloride: 103 mEq/L (ref 96–112)
Glucose, Bld: 82 mg/dL (ref 70–99)
Potassium: 4.1 mEq/L (ref 3.5–5.3)
Sodium: 143 mEq/L (ref 135–145)
Total Bilirubin: 0.4 mg/dL (ref 0.3–1.2)
Total Protein: 6.6 g/dL (ref 6.0–8.3)

## 2011-05-24 LAB — CBC WITH DIFFERENTIAL/PLATELET
BASO%: 0.3 % (ref 0.0–2.0)
Eosinophils Absolute: 0.1 10*3/uL (ref 0.0–0.5)
LYMPH%: 26.3 % (ref 14.0–49.7)
MONO#: 0.4 10*3/uL (ref 0.1–0.9)
NEUT#: 2 10*3/uL (ref 1.5–6.5)
Platelets: 273 10*3/uL (ref 145–400)
RBC: 4.25 10*6/uL (ref 3.70–5.45)
WBC: 3.4 10*3/uL — ABNORMAL LOW (ref 3.9–10.3)
lymph#: 0.9 10*3/uL (ref 0.9–3.3)

## 2011-05-24 NOTE — Patient Instructions (Signed)
1. I will see you back in mid June with labwork

## 2011-05-24 NOTE — Progress Notes (Signed)
OFFICE PROGRESS NOTE  CC  [name], MD  [name], MD, MD [place] [address]  DIAGNOSIS: 51 year old female with:  1.  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  PRIOR THERAPY:  #1 status post mastectomy with axillary lymph node dissection for a stage IIIa invasive lobular carcinoma measuring 6 x 8 cm with 6 positive lymph nodes.  #2 S/P 4 cycles of AC q 2 weeks with day 2 neulasta  #3 She is status post weekly Taxol starting on 01/31/2011 -  Completed 04/18/11. She received total of 12 weeks.  CURRENT THERAPY: patient is Receiving radiation  INTERVAL HISTORY: Vanessa Mills 51 y.o. female returns for followup visit today.Overall patient is doing well. She denies any fevers chills night sweats headaches. She has a libido fatigue. She has no myalgias or arthralgias. No no hematuria hematochezia melena hemoptysis. She has noticed a new mole in the left groin region which looks benign but she does have a dermatology appointment next week and I did ask her to have her dermatologist look at this. Remainder of the 10 point review of systems is negative.  MEDICAL HISTORY: Past Medical History  Diagnosis Date  . Chronic mental illness   . Meniere's syndrome   . Crohn's disease   . PONV (postoperative nausea and vomiting)   . Raynaud's disease   . GERD (gastroesophageal reflux disease)     does not take medications for   . Headache     takes midodrine for migraines prn  . Breast cancer, ILC, Left, receptor+, Her2- 10/10/2010  . Mental disorder     bipolar, takes saphris at hs  . Chemotherapy  follow-up examination 12/28/2010  . Fatigue 12/28/2010  . Breast cancer 03/07/2011  . Bipolar 1 disorder     ALLERGIES:   has no known allergies.  MEDICATIONS:  Current Outpatient Prescriptions  Medication Sig Dispense Refill  . ALPRAZolam (XANAX) 1 MG tablet Take 1 mg by mouth daily as needed.        [name] asenapine (SAPHRIS) 5 MG SUBL Place 10 mg under the tongue at bedtime.       . diazepam (VALIUM) 10 MG tablet as needed.       . isometheptene-acetaminophen-dichloralphenazone (MIDRIN) 65-325-100 MG capsule as needed.       . Multiple Vitamin (MULTIVITAMIN) capsule Take 1 capsule by mouth daily.        . propranolol (INDERAL) 60 MG tablet Take 60 mg by mouth 2 (two) times daily.        [name] triamterene-hydrochlorothiazide (DYAZIDE) 37.5-25 MG per capsule Take 1 capsule by mouth every morning.        . AMBULATORY NON FORMULARY MEDICATION Medication Name: MAGIC MOUTHWASH 2 % Viscous Lidocaine  Maalox Benadryl Susp.  Disp: 1:1:1 228m bottle Sig: 130mPO Swish & Spit  q 3-4hrs. PRN mouth sores  200 mL  3  . ciprofloxacin (CIPRO) 500 MG tablet Take 1 tablet (500 mg total) by mouth 2 (two) times daily.  14 tablet  0  . docusate sodium (COLACE) 100 MG capsule Take 100 mg by mouth as needed.        . nystatin (MYCOSTATIN) 100000  UNIT/ML suspension Take 5 mLs (500,000 Units total) by mouth 4 (four) times daily.  60 mL  0  . zolpidem (AMBIEN) 10 MG tablet Take 1 tablet (10 mg total) by mouth at bedtime as needed.  30 tablet  0    SURGICAL HISTORY:  Past Surgical History  Procedure Date  . Bladder tuck   . 2 orbital fracture surgeries   . Sinus exploration   . Bladder suspension     done 2005  . Mastectomy modified radical 10/23/2010    Left Dr Margot Chimes  . Portacath placement 11/28/2010    via right subclavian - Dr Margot Chimes  . Abdominal hysterectomy     uterus removed only    REVIEW OF SYSTEMS:  A comprehensive review of systems was negative.   PHYSICAL EXAMINATION: General appearance:  alert, cooperative and appears stated age Head: Normocephalic, without obvious abnormality, atraumatic Neck: no adenopathy, no carotid bruit, no JVD, supple, symmetrical, trachea midline and thyroid not enlarged, symmetric, no tenderness/mass/nodules Lymph nodes: Cervical, supraclavicular, and axillary nodes normal. Resp: clear to auscultation bilaterally and normal percussion bilaterally Back: symmetric, no curvature. ROM normal. No CVA tenderness. Cardio: regular rate and rhythm, S1, S2 normal, no murmur, click, rub or gallop and normal apical impulse GI: soft, non-tender; bowel sounds normal; no masses,  no organomegaly Extremities: extremities normal, atraumatic, no cyanosis or edema Neurologic: Alert and oriented X 3, normal strength and tone. Normal symmetric reflexes. Normal coordination and gait Right breast is examined today she is noted to have a palpable mass without any nipple inversion or retraction.  ECOG PERFORMANCE STATUS: 1 - Symptomatic but completely ambulatory  Blood pressure 116/71, pulse 45, temperature 98.6 F (37 C), temperature source Oral, height 5' 8"  (1.727 m), weight 165 lb 6.4 oz (75.025 kg).  LABORATORY DATA: Lab Results  Component Value Date   WBC 3.4* 05/24/2011   HGB 13.1 05/24/2011   HCT 38.6 05/24/2011   MCV 90.8 05/24/2011   PLT 273 05/24/2011      Chemistry      Component Value Date/Time   NA 140 04/18/2011 0944   K 4.0 04/18/2011 0944   CL 102 04/18/2011 0944   CO2 29 04/18/2011 0944   BUN 14 04/18/2011 0944   CREATININE 0.79 04/18/2011 0944      Component Value Date/Time   CALCIUM 9.4 04/18/2011 0944   ALKPHOS 48 04/18/2011 0944   AST 17 04/18/2011 0944   ALT 11 04/18/2011 0944   BILITOT 0.4 04/18/2011 0944       RADIOGRAPHIC STUDIES:  No results found.  ASSESSMENT: 51 year old female with:  1.  stage III breast cancer s/p mastectomy of the left breast which showed a 5 cm mass with 6 positive lymph nodes lobular features.  2.  Patient is  status post 4 cycles of dose dense Adriamycin and Cytoxan which she tolerated very well. She is status post 12 weeks of Taxol. Overall she has tolerated this quite well.  3 Patient is now receiving postmastectomy radiation she will complete this end of May 2013  PLAN:    All questions were answered. The patient knows to call the clinic with any problems, questions or concerns. We can certainly see the patient much sooner if necessary.  I spent >25 minutes counseling and coordination of care. The total time spent in the appointment was 30 minutes.    Marcy Panning, MD Medical/Oncology [facility] [phone] (beeper) [phone] (Office)  05/24/2011, 12:48 PM

## 2011-05-27 ENCOUNTER — Ambulatory Visit
Admission: RE | Admit: 2011-05-27 | Discharge: 2011-05-27 | Disposition: A | Discharge status: Home / Self Care | Payer: Medicare Other | Source: Ambulatory Visit | Attending: Radiation Oncology | Admitting: Radiation Oncology

## 2011-05-27 ENCOUNTER — Telehealth: Payer: Self-pay | Admitting: Oncology

## 2011-05-27 NOTE — Telephone Encounter (Signed)
S/w the pt and she is aware of her July appts

## 2011-05-28 ENCOUNTER — Ambulatory Visit
Admission: RE | Admit: 2011-05-28 | Discharge: 2011-05-28 | Disposition: A | Discharge status: Home / Self Care | Payer: Medicare Other | Source: Ambulatory Visit | Attending: Radiation Oncology | Admitting: Radiation Oncology

## 2011-05-28 ENCOUNTER — Encounter: Payer: Self-pay | Admitting: Radiation Oncology

## 2011-05-28 VITALS — BP 122/82 | HR 53 | Resp 18 | Wt 165.3 lb

## 2011-05-28 DIAGNOSIS — C50419 Malignant neoplasm of upper-outer quadrant of unspecified female breast: Secondary | ICD-10-CM

## 2011-05-28 NOTE — Progress Notes (Signed)
Patient presents to the clinic today unaccompanied for under treat visit with Dr. Sondra Come. Patient is alert and oriented to person, place, and time. No distress noted. Steady gait noted. Pleasant affect noted. Patient denies pain at this time. Patient denies nausea, vomiting, dizziness, headache or diarrhea. Patient unsure of any skin changes to her treated breast because she "doesn't look at herself." Patient reports that she does not use the Radiaplex as directed only occasionally. Patient denies soreness, itchiness, or irritation felt to the treatment area. Reported all findings to Dr. Sondra Come.

## 2011-05-28 NOTE — Progress Notes (Signed)
   Department of Radiation Oncology  Phone:  [phone] Fax:        [phone]   Weekly Management Note Current Dose: 18.0  Gy  Projected Dose: 59.4  Gy   Narrative:  The patient presents for routine under treatment assessment.  Port film x-rays were reviewed.  The chart was checked.  She is tolerating her radation therapy well without any significant itching or discomfort in the treatment area. She is having some mild fatigue.  Physical Findings: Weight: 165 lb 4.8 oz (74.98 kg). Mild erythema in the treatment area. The lungs are clear. The heart has a regular rhythm and rate.  Impression:  The patient is tolerating radiation.  Plan:  Continue treatment as planned.

## 2011-05-29 ENCOUNTER — Ambulatory Visit
Admission: RE | Admit: 2011-05-29 | Discharge: 2011-05-29 | Disposition: A | Discharge status: Home / Self Care | Payer: Medicare Other | Source: Ambulatory Visit | Attending: Radiation Oncology | Admitting: Radiation Oncology

## 2011-05-29 DIAGNOSIS — C50419 Malignant neoplasm of upper-outer quadrant of unspecified female breast: Secondary | ICD-10-CM

## 2011-05-30 ENCOUNTER — Ambulatory Visit
Admission: RE | Admit: 2011-05-30 | Discharge: 2011-05-30 | Disposition: A | Discharge status: Home / Self Care | Payer: Medicare Other | Source: Ambulatory Visit | Attending: Radiation Oncology | Admitting: Radiation Oncology

## 2011-05-30 NOTE — Progress Notes (Signed)
   Department of Radiation Oncology  Phone:  [phone] Fax:        [phone]   Electron beam simulation note  Today Vanessa Mills underwent additional planning for radiation therapy directed at the left chest wall. The patient's treatment planning CT scan was reviewed and she  had set up with a custom electron cutout field at the field directed at the mastectomy scar area. This will be treated with 6 MeV electrons prescribed to the 91% isodose line.  To ensure adequate dose to the superficial aspects of the target area a 0.6 cm bolus will be placed daily during the patient's electron treatment. The patient receive 8 additional treatments at 180 cGy per day for an additional dose of 1440 cGy. A special port plan is requested for treatment.  -----------------------------------  Blair Promise, PhD, MD

## 2011-05-31 ENCOUNTER — Ambulatory Visit
Admission: RE | Admit: 2011-05-31 | Discharge: 2011-05-31 | Disposition: A | Discharge status: Home / Self Care | Payer: Medicare Other | Source: Ambulatory Visit | Attending: Radiation Oncology | Admitting: Radiation Oncology

## 2011-06-03 ENCOUNTER — Ambulatory Visit
Admission: RE | Admit: 2011-06-03 | Discharge: 2011-06-03 | Disposition: A | Discharge status: Home / Self Care | Payer: Medicare Other | Source: Ambulatory Visit | Attending: Radiation Oncology | Admitting: Radiation Oncology

## 2011-06-04 ENCOUNTER — Ambulatory Visit
Admission: RE | Admit: 2011-06-04 | Discharge: 2011-06-04 | Disposition: A | Discharge status: Home / Self Care | Payer: Medicare Other | Source: Ambulatory Visit | Attending: Radiation Oncology | Admitting: Radiation Oncology

## 2011-06-04 DIAGNOSIS — C50419 Malignant neoplasm of upper-outer quadrant of unspecified female breast: Secondary | ICD-10-CM

## 2011-06-04 NOTE — Progress Notes (Signed)
   Department of Radiation Oncology  Phone:  [phone] Fax:        [phone]   Weekly Management Note Current Dose: 27.0  Gy  Projected Dose: 59.4 Gy   Narrative:  The patient presents for routine under treatment assessment. Port film x-rays were reviewed.  The chart was checked. She has some mild fatigue. Patient has minimal discomfort/pruritis  along the chest wall area.  Physical Findings: The neck axilla and supraclavicular areas are free of adenopathy. The lungs are clear to auscultation. The heart has regular rhythm and rate. The left chest wall shows some mild hyperpigmentation changes.  Impression:  The patient is tolerating radiation well.  Plan:  Continue treatment as planned.   -----------------------------------  Blair Promise, PhD, MD

## 2011-06-04 NOTE — Progress Notes (Signed)
HERE TODAY FOR PUT OF LEFT CHEST/BREAST.  SKIN LOOKS GREAT, SAYS SHE IS NOT USING RADIAPLEX AND I ENCOURAGED HER TO USE IT.   HAS NO OTHER C/O TODAY

## 2011-06-05 ENCOUNTER — Ambulatory Visit
Admission: RE | Admit: 2011-06-05 | Discharge: 2011-06-05 | Disposition: A | Discharge status: Home / Self Care | Payer: Medicare Other | Source: Ambulatory Visit | Attending: Radiation Oncology | Admitting: Radiation Oncology

## 2011-06-05 DIAGNOSIS — R259 Unspecified abnormal involuntary movements: Secondary | ICD-10-CM | POA: Insufficient documentation

## 2011-06-05 DIAGNOSIS — G243 Spasmodic torticollis: Secondary | ICD-10-CM | POA: Insufficient documentation

## 2011-06-06 ENCOUNTER — Ambulatory Visit
Admission: RE | Admit: 2011-06-06 | Discharge: 2011-06-06 | Disposition: A | Discharge status: Home / Self Care | Payer: Medicare Other | Source: Ambulatory Visit | Attending: Radiation Oncology | Admitting: Radiation Oncology

## 2011-06-07 ENCOUNTER — Ambulatory Visit
Admission: RE | Admit: 2011-06-07 | Discharge: 2011-06-07 | Disposition: A | Discharge status: Home / Self Care | Payer: Medicare Other | Source: Ambulatory Visit | Attending: Radiation Oncology | Admitting: Radiation Oncology

## 2011-06-10 ENCOUNTER — Ambulatory Visit
Admission: RE | Admit: 2011-06-10 | Discharge: 2011-06-10 | Disposition: A | Discharge status: Home / Self Care | Payer: Medicare Other | Source: Ambulatory Visit | Attending: Radiation Oncology | Admitting: Radiation Oncology

## 2011-06-11 ENCOUNTER — Ambulatory Visit
Admission: RE | Admit: 2011-06-11 | Discharge: 2011-06-11 | Disposition: A | Discharge status: Home / Self Care | Payer: Medicare Other | Source: Ambulatory Visit | Attending: Radiation Oncology | Admitting: Radiation Oncology

## 2011-06-11 DIAGNOSIS — C50419 Malignant neoplasm of upper-outer quadrant of unspecified female breast: Secondary | ICD-10-CM

## 2011-06-11 NOTE — Progress Notes (Signed)
Weekly Management Note Current Dose: 53 Gy  Projected Dose: 59.4 Gy   Narrative:  The patient presents for routine under treatment assessment.  CBCT/MVCT images/Port film x-rays were reviewed.  The chart was checked. Some irritation in medial left chest wall. Using radiaplex.   Physical Findings: Dermatitis in medial left chest wall. Some pink in outer edges of field.  Impression:  The patient is tolerating radiation.  Plan:  Continue treatment as planned. Add hydrocortisone to dermatitis prn. Continue radiaplex.

## 2011-06-11 NOTE — Progress Notes (Signed)
HERE TODAY FOR PUT OF LEFT CHEST WALL, BILATERAL MASTECTOMY.  SKIN RED WITH RADIATION DERMATITIS NOTED ON UPPER LEFT TO CENTER CHEST.  C/O OF ITCHING AND BURNING, USING RADIAPLEX.

## 2011-06-12 ENCOUNTER — Ambulatory Visit
Admission: RE | Admit: 2011-06-12 | Discharge: 2011-06-12 | Disposition: A | Discharge status: Home / Self Care | Payer: Medicare Other | Source: Ambulatory Visit | Attending: Radiation Oncology | Admitting: Radiation Oncology

## 2011-06-13 ENCOUNTER — Ambulatory Visit
Admission: RE | Admit: 2011-06-13 | Discharge: 2011-06-13 | Disposition: A | Discharge status: Home / Self Care | Payer: Medicare Other | Source: Ambulatory Visit | Attending: Radiation Oncology | Admitting: Radiation Oncology

## 2011-06-14 ENCOUNTER — Ambulatory Visit
Admission: RE | Admit: 2011-06-14 | Discharge: 2011-06-14 | Disposition: A | Discharge status: Home / Self Care | Payer: Medicare Other | Source: Ambulatory Visit | Attending: Radiation Oncology | Admitting: Radiation Oncology

## 2011-06-17 ENCOUNTER — Ambulatory Visit
Admission: RE | Admit: 2011-06-17 | Discharge: 2011-06-17 | Disposition: A | Discharge status: Home / Self Care | Payer: Medicare Other | Source: Ambulatory Visit | Attending: Radiation Oncology | Admitting: Radiation Oncology

## 2011-06-17 DIAGNOSIS — C50419 Malignant neoplasm of upper-outer quadrant of unspecified female breast: Secondary | ICD-10-CM

## 2011-06-17 NOTE — Progress Notes (Signed)
HERE TODAY FOR PUT OF LEFT CHEST WALL.  SKIN BRIGHT RED WITH SOME RADIATION DERMATITIS NOTED.  USING RADIAPLEX AND HYDROCORTISONE CREAM TO THIS AREA AS DIRECTED BY DR. Pablo Ledger BUT NOT HELPING.  CAN'T WEAR SEATBELT DUE TO THIS.  RATES PAIN IN THIS AREA AS 8/10

## 2011-06-17 NOTE — Progress Notes (Signed)
   Department of Radiation Oncology  Phone:  [phone] Fax:        [phone]   Weekly Management Note Current Dose: 43.2  Gy  Projected Dose: 59.4 Gy   Narrative:  The patient presents for routine under treatment assessment.  Port film x-rays were reviewed.  The chart was checked.  She is having significant pruritis within the treatment area. She is using the radiaplex as well as hydrocortisone cream. I recommended  she limit her use of hydrocortisone cream to one to 2 times per day.  Physical Findings: He lungs are clear. The heart has a regular rhythm and rate. Patient has brisk erythema particularly in the upper left chest without any moist desquamation.  Impression:  The patient is tolerating radiation except for issues as above.  Plan:  Continue treatment as planned. She will begin her electron boost field later this week.  -----------------------------------  Blair Promise, PhD, MD

## 2011-06-18 ENCOUNTER — Ambulatory Visit
Admission: RE | Admit: 2011-06-18 | Discharge: 2011-06-18 | Disposition: A | Discharge status: Home / Self Care | Payer: Medicare Other | Source: Ambulatory Visit | Attending: Radiation Oncology | Admitting: Radiation Oncology

## 2011-06-19 ENCOUNTER — Ambulatory Visit
Admission: RE | Admit: 2011-06-19 | Discharge: 2011-06-19 | Disposition: A | Discharge status: Home / Self Care | Payer: Medicare Other | Source: Ambulatory Visit | Attending: Radiation Oncology | Admitting: Radiation Oncology

## 2011-06-20 ENCOUNTER — Ambulatory Visit
Admission: RE | Admit: 2011-06-20 | Discharge: 2011-06-20 | Disposition: A | Discharge status: Home / Self Care | Payer: Medicare Other | Source: Ambulatory Visit | Attending: Radiation Oncology | Admitting: Radiation Oncology

## 2011-06-21 ENCOUNTER — Ambulatory Visit
Admission: RE | Admit: 2011-06-21 | Discharge: 2011-06-21 | Disposition: A | Discharge status: Home / Self Care | Payer: Medicare Other | Source: Ambulatory Visit | Attending: Radiation Oncology | Admitting: Radiation Oncology

## 2011-06-25 ENCOUNTER — Ambulatory Visit
Admission: RE | Admit: 2011-06-25 | Discharge: 2011-06-25 | Disposition: A | Discharge status: Home / Self Care | Payer: Medicare Other | Source: Ambulatory Visit | Attending: Radiation Oncology | Admitting: Radiation Oncology

## 2011-06-25 ENCOUNTER — Encounter: Payer: Self-pay | Admitting: Radiation Oncology

## 2011-06-25 VITALS — BP 115/72 | HR 46 | Resp 20 | Wt 170.0 lb

## 2011-06-25 DIAGNOSIS — C50419 Malignant neoplasm of upper-outer quadrant of unspecified female breast: Secondary | ICD-10-CM

## 2011-06-25 MED ORDER — EMOLLIENT BASE EX CREA: TOPICAL_CREAM | CUTANEOUS | Status: DC | PRN

## 2011-06-25 MED ORDER — BIAFINE EX EMUL
Freq: Two times a day (BID) | CUTANEOUS | Status: DC
  Administered 2011-06-25: 11:00:00 via TOPICAL

## 2011-06-25 NOTE — Progress Notes (Signed)
   Department of Radiation Oncology  Phone:  [phone] Fax:        [phone]   Weekly Management Note Current Dose: 52.2  Gy  Projected Dose: 59.4  Gy   Narrative:  The patient presents for routine under treatment assessment.  CBCT/MVCT images/Port film x-rays were reviewed.  The chart was checked.  The patient's electron setup was checked on the linear accelerator today. She is having soreness on the low axillary/lateral chest wall region. She does have some fatigue in addition.  Physical Findings: Weight: 170 lb 0.5 oz (77.125 kg). The lungs are clear. The heart has regular rhythm and rate. The chest area shows erythema and dry desquamation but no moist desquamation.  Impression:  The patient is tolerating radiation reasonably well.  Plan:  Continue treatment as planned.   -----------------------------------  Blair Promise, PhD, MD

## 2011-06-25 NOTE — Progress Notes (Signed)
Pt c/o pain, burning in left axilla x 3 days, 8/10 this morning. She has not taken any OTC for this pain.  Suggested she take Advil or Tylenol if she is not allergic, which she denied. She has small amt mainly dry desquamation in left  axilla. Advised she apply Neosporin in this area. Gave pt Biafine for overall irritation in tx area. Fatigued, appetite good.

## 2011-06-26 ENCOUNTER — Ambulatory Visit
Admission: RE | Admit: 2011-06-26 | Discharge: 2011-06-26 | Disposition: A | Discharge status: Home / Self Care | Payer: Medicare Other | Source: Ambulatory Visit | Attending: Radiation Oncology | Admitting: Radiation Oncology

## 2011-06-26 ENCOUNTER — Encounter (INDEPENDENT_AMBULATORY_CARE_PROVIDER_SITE_OTHER): Payer: Medicare Other | Admitting: Surgery

## 2011-06-27 ENCOUNTER — Ambulatory Visit
Admission: RE | Admit: 2011-06-27 | Discharge: 2011-06-27 | Disposition: A | Discharge status: Home / Self Care | Payer: Medicare Other | Source: Ambulatory Visit | Attending: Radiation Oncology | Admitting: Radiation Oncology

## 2011-06-28 ENCOUNTER — Ambulatory Visit
Admission: RE | Admit: 2011-06-28 | Discharge: 2011-06-28 | Disposition: A | Discharge status: Home / Self Care | Payer: Medicare Other | Source: Ambulatory Visit | Attending: Radiation Oncology | Admitting: Radiation Oncology

## 2011-07-01 ENCOUNTER — Ambulatory Visit
Admission: RE | Admit: 2011-07-01 | Discharge: 2011-07-01 | Disposition: A | Discharge status: Home / Self Care | Payer: Medicare Other | Source: Ambulatory Visit | Attending: Radiation Oncology | Admitting: Radiation Oncology

## 2011-07-01 VITALS — BP 110/78 | HR 45 | Wt 171.7 lb

## 2011-07-01 DIAGNOSIS — C50419 Malignant neoplasm of upper-outer quadrant of unspecified female breast: Secondary | ICD-10-CM

## 2011-07-01 MED ORDER — HYDROCODONE-ACETAMINOPHEN 5-500 MG PO CAPS: 1.0000 | ORAL_CAPSULE | Freq: Four times a day (QID) | ORAL | Status: AC | PRN

## 2011-07-01 MED ORDER — BIAFINE EX EMUL
Freq: Two times a day (BID) | CUTANEOUS | Status: DC
  Administered 2011-07-01: 12:00:00 via TOPICAL

## 2011-07-01 NOTE — Progress Notes (Signed)
RX FOR Vanessa Mills, HYDROCODONE APAP 5-500MG, CALLED TO [facility], [id number].  CALLED PATIENT TO LET HER KNOW THAT THIS HAD BEEN CALLED IN.

## 2011-07-01 NOTE — Progress Notes (Signed)
Weekly Management Note Current Dose: 59.4  Gy  Projected Dose: 59.4  Gy   Narrative:  The patient presents for routine under treatment assessment.  CBCT/MVCT images/Port film x-rays were reviewed.  The chart was checked.  She is having discomfort along the chest wall and low axillary area. Patient is intolerant of Advil or Aleve. In light of this patient will be given a prescription for hydrocodone to use. Patient does complete her radiation therapy today.  Physical Findings: Weight: 171 lb 11.2 oz (77.883 kg). The lungs are clear. The heart has a regular rhythm and rate. The left chest wall shows erythema and dry desquamation and mild moist desquamation.  Impression:  The patient has completed radiation therapy  Plan:  Followup in one month. Advised patient to continue using Neosporin ointment for the next 5-7 days.  -----------------------------------  Blair Promise, PhD, MD

## 2011-07-09 NOTE — Progress Notes (Signed)
  Radiation Oncology         [phone] ________________________________  Name: ANETH SCHLAGEL MRN: [id number]  Date: 07/01/2011  DOB: March 20, 1960  End of Treatment Note  Diagnosis:   Stage IIIA left breast     Indication for treatment:  Postmastectomy with risk for local regional recurrence       Radiation treatment dates:   05/15/2011 through 07/01/2011  Site/dose:   Left chest wall high axilla and supraclavicular region 4500 cGy in 25 fractions. The left chest wall/mastectomy scar area was boosted further to a dose of 5940 cGy.  Beams/energy:   Three-field setup initially using 2 tangential beams and a right anterior oblique field. The chest wall area was boosted further with a custom electron cutout field using 6 MeV electrons.  Narrative: The patient tolerated radiation treatment relatively well.   She did have some fatigue as expected with her treatments. The patient also developed mild moist desquamation at the completion of her treatment.  Plan: The patient has completed radiation treatment. The patient will return to radiation oncology clinic for routine followup in one month. I advised them to call or return sooner if they have any questions or concerns related to their recovery or treatment.  -----------------------------------  Blair Promise, PhD, MD

## 2011-07-11 ENCOUNTER — Encounter (INDEPENDENT_AMBULATORY_CARE_PROVIDER_SITE_OTHER): Payer: Self-pay | Admitting: Surgery

## 2011-07-11 ENCOUNTER — Ambulatory Visit (INDEPENDENT_AMBULATORY_CARE_PROVIDER_SITE_OTHER): Payer: Medicare Other | Admitting: Surgery

## 2011-07-11 VITALS — BP 124/85 | HR 60 | Temp 97.0°F | Ht 68.5 in | Wt 166.2 lb

## 2011-07-11 DIAGNOSIS — C50419 Malignant neoplasm of upper-outer quadrant of unspecified female breast: Secondary | ICD-10-CM

## 2011-07-11 NOTE — Progress Notes (Signed)
NAME: Vanessa Mills       DOB: [birth year]           DATE: 07/11/2011       MRN: [id number]   KI LUCKMAN is a 51 y.o.Marland Kitchenfemale who presents for routine followup of her Left breact cancer, ILC, diagnosed in 200[address] and treated with mastectomy, chemo and just finished radiation. She has no problems or concerns on either side. She wants to get her port out.  PFSH: She has had no significant changes since the last visit here.  ROS: There have been no significant changes since the last visit here  EXAM:  VS: BP 124/85  Pulse 60  Temp 97 F (36.1 C) (Temporal)  Ht 5' 8.5" (1.74 m)  Wt 166 lb 3.2 oz (75.388 kg)  BMI 24.90 kg/m2  SpO2 98%   General: The patient is alert, oriented, generally healthy appearing, NAD. Mood and affect are normal.  Breasts:  Right is negative. Left s/p mastectomy with some acute radiation changes, esp. Laterally.  Lymphatics: She has no axillary or supraclavicular adenopathy on either side.  Extremities: Full ROM of the surgical side with no lymphedema noted.  Data Reviewed: Epic notes  Impression: Doing well, with no evidence of recurrent cancer or new cancer  Plan: Will continue to follow up on an annual basis here, next visit in about four months for the one year f/u. Will schedule port removal under local at her convenience.

## 2011-07-11 NOTE — Patient Instructions (Signed)
We will schedule surgery to remove your port. It can be done at the [facility] and with local anesthesia. I need to see you in the Fall, after your mammogram, for a one year check up after surgery

## 2011-07-19 ENCOUNTER — Other Ambulatory Visit (HOSPITAL_BASED_OUTPATIENT_CLINIC_OR_DEPARTMENT_OTHER): Payer: Medicare Other | Admitting: Lab

## 2011-07-19 ENCOUNTER — Ambulatory Visit: Payer: Medicare Other | Admitting: Lab

## 2011-07-19 ENCOUNTER — Encounter: Payer: Self-pay | Admitting: Oncology

## 2011-07-19 ENCOUNTER — Telehealth: Payer: Self-pay | Admitting: Oncology

## 2011-07-19 ENCOUNTER — Ambulatory Visit (HOSPITAL_BASED_OUTPATIENT_CLINIC_OR_DEPARTMENT_OTHER): Payer: Medicare Other | Admitting: Oncology

## 2011-07-19 VITALS — BP 120/83 | HR 48 | Temp 98.4°F | Ht 68.5 in | Wt 167.7 lb

## 2011-07-19 DIAGNOSIS — C50919 Malignant neoplasm of unspecified site of unspecified female breast: Secondary | ICD-10-CM

## 2011-07-19 DIAGNOSIS — Z09 Encounter for follow-up examination after completed treatment for conditions other than malignant neoplasm: Secondary | ICD-10-CM

## 2011-07-19 DIAGNOSIS — C50419 Malignant neoplasm of upper-outer quadrant of unspecified female breast: Secondary | ICD-10-CM

## 2011-07-19 DIAGNOSIS — E559 Vitamin D deficiency, unspecified: Secondary | ICD-10-CM

## 2011-07-19 DIAGNOSIS — Z170 Estrogen receptor positive status [ER+]: Secondary | ICD-10-CM

## 2011-07-19 LAB — COMPREHENSIVE METABOLIC PANEL
AST: 15 U/L (ref 0–37)
Albumin: 4.3 g/dL (ref 3.5–5.2)
Alkaline Phosphatase: 68 U/L (ref 39–117)
BUN: 19 mg/dL (ref 6–23)
Creatinine, Ser: 0.96 mg/dL (ref 0.50–1.10)
Potassium: 4 mEq/L (ref 3.5–5.3)

## 2011-07-19 LAB — CBC WITH DIFFERENTIAL/PLATELET
Basophils Absolute: 0 10*3/uL (ref 0.0–0.1)
EOS%: 1.5 % (ref 0.0–7.0)
HGB: 13.1 g/dL (ref 11.6–15.9)
MCH: 31.1 pg (ref 25.1–34.0)
MCHC: 33.4 g/dL (ref 31.5–36.0)
MCV: 93.3 fL (ref 79.5–101.0)
MONO%: 10.5 % (ref 0.0–14.0)
RDW: 13.6 % (ref 11.2–14.5)

## 2011-07-19 NOTE — Patient Instructions (Addendum)
1. We will check your hormone levels and call with results.  2. I will see you back in 3 months (August 2013)

## 2011-07-19 NOTE — Telephone Encounter (Signed)
gve the pt her aug 2013 appt calendar

## 2011-07-19 NOTE — Progress Notes (Signed)
OFFICE PROGRESS NOTE  CC  [name], MD  [name], MD [name] [address]  DIAGNOSIS: 51 year old female with:  1.  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  PRIOR THERAPY:  #1 status post mastectomy with axillary lymph node dissection for a stage IIIa invasive lobular carcinoma measuring 6 x 8 cm with 6 positive lymph nodes.  #2 S/P 4 cycles of AC q 2 weeks with day 2 neulasta  #3 She is status post weekly Taxol starting on 01/31/2011 -  Completed 04/18/11. She received total of 12 weeks.  #4. S/p Radiation therapy to the left chest wall from 4/17 - 07/01/11  CURRENT THERAPY: patient to begin adjuvant anti-estrogen   INTERVAL HISTORY: Vanessa Mills 51 y.o. female returns for followup visit today.. Patient completed her radiation therapy on 07/01/2011. Overall she tolerated it well except for some fatigue. She did develop significant skin changes from the radiation. She today feels well but tired she has no nausea vomiting fevers chills night sweats headaches no chest pains no palpitations no shortness of breath no bleeding problems. Patient is ready to begin adjuvant antiestrogen therapy. But it is uncertain whether she is premenopausal postmenopausal. We discussed this at great length today. Remainder of the 10 point review of systems is negative.  MEDICAL HISTORY: Past Medical History  Diagnosis Date  . Chronic mental illness   . Meniere's syndrome   . Crohn's disease   . PONV (postoperative nausea and vomiting)   . Raynaud's disease   . GERD (gastroesophageal reflux disease)     does not  take medications for   . Headache     takes midodrine for migraines prn  . Breast cancer, ILC, Left, receptor+, Her2- 10/10/2010  . Mental disorder     bipolar, takes saphris at hs  . Chemotherapy follow-up examination 12/28/2010  . Fatigue 12/28/2010  . Breast cancer 03/07/2011  . Bipolar 1 disorder     ALLERGIES:   has no known allergies.  MEDICATIONS:  Current Outpatient Prescriptions  Medication Sig Dispense Refill  . ALPRAZolam (XANAX) 1 MG tablet Take 1 mg by mouth daily as needed.        . AMBULATORY NON FORMULARY MEDICATION Medication Name: MAGIC MOUTHWASH 2 % Viscous Lidocaine  Maalox Benadryl Susp.  Disp: 1:1:1 249m bottle Sig: 132mPO Swish & Spit  q 3-4hrs. PRN mouth sores  200 mL  3  . asenapine (SAPHRIS) 5 MG SUBL Place 10 mg under the tongue at bedtime.       . diazepam (VALIUM) 10 MG tablet as needed.       . Emollient (BIAFINE WOUND DRESSING EX) Apply topically 2 (two) times daily as needed.      . isometheptene-acetaminophen-dichloralphenazone (MIDRIN) 65-325-100 MG capsule as needed.       . Multiple Vitamin (MULTIVITAMIN) capsule Take 1 capsule by mouth daily.        . propranolol (INDERAL) 60 MG tablet Take 60 mg by mouth 2 (two) times daily.        . Marland Kitchenriamterene-hydrochlorothiazide (DYAZIDE) 37.5-25 MG per capsule Take 1 capsule by mouth every morning.  SURGICAL HISTORY:  Past Surgical History  Procedure Date  . Bladder tuck   . 2 orbital fracture surgeries   . Sinus exploration   . Bladder suspension     done 2005  . Mastectomy modified radical 10/23/2010    Left Dr Margot Chimes  . Portacath placement 11/28/2010    via right subclavian - Dr Margot Chimes  . Abdominal hysterectomy     uterus removed only  . Breast surgery     REVIEW OF SYSTEMS:  A comprehensive review of systems was negative.   PHYSICAL EXAMINATION: General appearance: alert, cooperative and appears stated age Head: Normocephalic, without obvious abnormality, atraumatic Neck: no  adenopathy, no carotid bruit, no JVD, supple, symmetrical, trachea midline and thyroid not enlarged, symmetric, no tenderness/mass/nodules Lymph nodes: Cervical, supraclavicular, and axillary nodes normal. Resp: clear to auscultation bilaterally and normal percussion bilaterally Back: symmetric, no curvature. ROM normal. No CVA tenderness. Cardio: regular rate and rhythm, S1, S2 normal, no murmur, click, rub or gallop and normal apical impulse GI: soft, non-tender; bowel sounds normal; no masses,  no organomegaly Extremities: extremities normal, atraumatic, no cyanosis or edema Neurologic: Alert and oriented X 3, normal strength and tone. Normal symmetric reflexes. Normal coordination and gait Right breast is examined today she is noted to have a palpable mass without any nipple inversion or retraction.  ECOG PERFORMANCE STATUS: 1 - Symptomatic but completely ambulatory  Blood pressure 120/83, pulse 48, temperature 98.4 F (36.9 C), temperature source Oral, height 5' 8.5" (1.74 m), weight 167 lb 11.2 oz (76.068 kg).  LABORATORY DATA: Lab Results  Component Value Date   WBC 4.0 07/19/2011   HGB 13.1 07/19/2011   HCT 39.3 07/19/2011   MCV 93.3 07/19/2011   PLT 320 07/19/2011      Chemistry      Component Value Date/Time   NA 143 05/24/2011 1126   K 4.1 05/24/2011 1126   CL 103 05/24/2011 1126   CO2 32 05/24/2011 1126   BUN 13 05/24/2011 1126   CREATININE 0.92 05/24/2011 1126      Component Value Date/Time   CALCIUM 9.9 05/24/2011 1126   ALKPHOS 60 05/24/2011 1126   AST 15 05/24/2011 1126   ALT 10 05/24/2011 1126   BILITOT 0.4 05/24/2011 1126       RADIOGRAPHIC STUDIES:  No results found.  ASSESSMENT: 51 year old female with:  1.  stage III breast cancer s/p mastectomy of the left breast which showed a 5 cm mass with 6 positive lymph nodes lobular features.  2.  Patient is status post 4 cycles of dose dense Adriamycin and Cytoxan which she tolerated very well. She is status post 12  weeks of Taxol. Overall she has tolerated this quite well.  #3 patient will begin antiestrogen therapy  PLAN:  #1 since patient may be pre-or postmenopausal we will check her hormone levels including FSH and estradiol levels. Once we have the results of this we will call her. If she is postmenopausal then I will begin her on letrozole 2.5 mg daily. However if her levels are in the premenopausal or range then she will begin tamoxifen. Literature for both letrozole and tamoxifen were given to her.  #2 we discussed the role of staging studies in this high risk patient. I do think that a baseline staging scans such as a PET scan should be performed possibly in about 3 months time. I do think she is at high risk for recurrence and she may need very close followup with  radiographic studies.  #3 patient will return in August 2013 for followup. However she knows to call me sooner if need arises. She will also need mammograms in the very near future.  All questions were answered. The patient knows to call the clinic with any problems, questions or concerns. We can certainly see the patient much sooner if necessary.  I spent >25 minutes counseling and coordination of care. The total time spent in the appointment was 30 minutes.    Marcy Panning, MD Medical/Oncology [facility] [phone] (beeper) [phone] (Office)  07/19/2011, 9:00 AM

## 2011-07-22 ENCOUNTER — Telehealth: Payer: Self-pay | Admitting: *Deleted

## 2011-07-22 NOTE — Telephone Encounter (Signed)
Pt called states " I had my last chemo in march. Do I need to get my port flushed?" Onc Tx sent for [facility] Flush. Will review with MD

## 2011-07-24 ENCOUNTER — Telehealth: Payer: Self-pay | Admitting: Oncology

## 2011-07-24 NOTE — Telephone Encounter (Signed)
S/w the pt and she is aware of her flush appt on 07/31/2011

## 2011-07-24 NOTE — Telephone Encounter (Signed)
Pt called LMOVM regarding PAC flush appt.  Returned pt's called advised Flush appt has been requested and to expect a call from scheduling.

## 2011-07-29 ENCOUNTER — Other Ambulatory Visit: Payer: Self-pay | Admitting: Oncology

## 2011-07-29 ENCOUNTER — Telehealth: Payer: Self-pay | Admitting: Medical Oncology

## 2011-07-29 DIAGNOSIS — C50419 Malignant neoplasm of upper-outer quadrant of unspecified female breast: Secondary | ICD-10-CM

## 2011-07-29 MED ORDER — LETROZOLE 2.5 MG PO TABS: 2.5000 mg | ORAL_TABLET | Freq: Every day | ORAL | Status: AC

## 2011-07-29 NOTE — Telephone Encounter (Signed)
Patient will go femara 2.5 mg daily

## 2011-07-29 NOTE — Telephone Encounter (Signed)
Patient LMOVM, "I had lab work drawn last week and I have not heard from anyone about the results.  I wanted to know what the level was that is going to determine which pill I am going to be on."  Will review with MD.  [facility] 07/19/2011: 108.4

## 2011-07-29 NOTE — Telephone Encounter (Signed)
Per MD, patient to receive femara 2.5 mg daily.  Patient with no further questions.

## 2011-07-30 ENCOUNTER — Encounter: Payer: Self-pay | Admitting: Radiation Oncology

## 2011-07-31 ENCOUNTER — Ambulatory Visit (HOSPITAL_BASED_OUTPATIENT_CLINIC_OR_DEPARTMENT_OTHER): Payer: Medicare Other

## 2011-07-31 ENCOUNTER — Ambulatory Visit
Admission: RE | Admit: 2011-07-31 | Discharge: 2011-07-31 | Disposition: A | Discharge status: Home / Self Care | Payer: Medicare Other | Source: Ambulatory Visit | Attending: Radiation Oncology | Admitting: Radiation Oncology

## 2011-07-31 ENCOUNTER — Encounter: Payer: Self-pay | Admitting: Radiation Oncology

## 2011-07-31 VITALS — BP 110/76 | HR 42 | Temp 97.6°F | Wt 170.4 lb

## 2011-07-31 VITALS — BP 120/76 | HR 44 | Temp 98.0°F

## 2011-07-31 DIAGNOSIS — C50419 Malignant neoplasm of upper-outer quadrant of unspecified female breast: Secondary | ICD-10-CM

## 2011-07-31 DIAGNOSIS — Z452 Encounter for adjustment and management of vascular access device: Secondary | ICD-10-CM

## 2011-07-31 DIAGNOSIS — C50919 Malignant neoplasm of unspecified site of unspecified female breast: Secondary | ICD-10-CM

## 2011-07-31 MED ORDER — SODIUM CHLORIDE 0.9 % IJ SOLN
10.0000 mL | INTRAMUSCULAR | Status: DC | PRN
  Administered 2011-07-31: 10 mL via INTRAVENOUS
  Filled 2011-07-31: qty 10

## 2011-07-31 MED ORDER — HEPARIN SOD (PORK) LOCK FLUSH 100 UNIT/ML IV SOLN
500.0000 [IU] | Freq: Once | INTRAVENOUS | Status: AC
  Administered 2011-07-31: 500 [IU] via INTRAVENOUS
  Filled 2011-07-31: qty 5

## 2011-07-31 NOTE — Progress Notes (Signed)
Radiation Oncology         [phone] ________________________________  Name: Vanessa Mills MRN: [id number]  Date: 07/31/2011  DOB: November 15, 1960  Follow-Up Visit Note  CC: [name], MD  Deatra Robinson, MD  Diagnosis:    Stage IIIa left breast cancer  Interval Since Last Radiation:  1 month  Narrative:  The patient returns today for routine follow-up.  She is doing quite well at this time to this denies any for further fatigue itching or discomfort along the left chest wall area. She denies any problems with swelling in her left arm or hand.                              ALLERGIES:   has no known allergies.  Meds: Current Outpatient Prescriptions  Medication Sig Dispense Refill  . ALPRAZolam (XANAX) 1 MG tablet Take 1 mg by mouth daily as needed.        [name] asenapine (SAPHRIS) 5 MG SUBL Place 10 mg under the tongue at bedtime.       . diazepam (VALIUM) 10 MG tablet as needed.       . isometheptene-acetaminophen-dichloralphenazone (MIDRIN) 65-325-100 MG capsule as needed.       [name] letrozole (FEMARA) 2.5 MG tablet Take 1 tablet (2.5 mg total) by mouth daily.  90 tablet  6  . Multiple Vitamin (MULTIVITAMIN) capsule Take 1 capsule by mouth daily.        . propranolol (INDERAL) 60 MG tablet Take 60 mg by mouth 2 (two) times daily.        [name] triamterene-hydrochlorothiazide (DYAZIDE) 37.5-25 MG per capsule Take 1 capsule by mouth every morning.        . AMBULATORY NON FORMULARY MEDICATION Medication Name: MAGIC MOUTHWASH 2 % Viscous Lidocaine  Maalox Benadryl Susp.  Disp: 1:1:1 262m bottle Sig: 173mPO Swish & Spit  q 3-4hrs. PRN mouth sores  200 mL  3  . Emollient (BIAFINE WOUND DRESSING EX) Apply topically 2 (two) times daily as needed.       No current facility-administered medications for this encounter.   Facility-Administered Medications Ordered in Other Encounters  Medication Dose Route Frequency Provider Last Rate Last Dose  . heparin lock flush 100 unit/mL  500  Units Intravenous Once KaDeatra RobinsonMD   500 Units at 07/31/11 [id number]. DISCONTD: sodium chloride 0.9 % injection 10 mL  10 mL Intravenous PRN KaDeatra RobinsonMD   10 mL at 07/31/11 [id number]  Physical Findings: The patient is in no acute distress. Patient is alert and oriented.  weight is 170 lb 6.4 oz (77.293 kg). Her temperature is 97.6 F (36.4 C). Her blood pressure is 110/76 and her pulse is 42. . Marland KitchenNo palpable cervical supraclavicular or axillary adenopathy. The lungs are clear to auscultation. The heart has regular rhythm and rate. The  right breast is free of mass or nipple discharge. The left chest wall area is well healed at this time with some hyperpigmentation changes. There is no palpable or visible signs recurrence.  Lab Findings: Lab Results  Component Value Date   WBC 4.0 07/19/2011   HGB 13.1 07/19/2011   HCT 39.3 07/19/2011   MCV 93.3 07/19/2011   PLT 320 07/19/2011    @LASTCHEM @  Radiographic Findings: No results found.  Impression:  The patient is recovering from the effects of radiation.  She has started adjuvant hormonal therapy.  Plan:  Routine followup in 6 months.   _____________________________________  -----------------------------------  Blair Promise, PhD, MD

## 2011-07-31 NOTE — Progress Notes (Signed)
Here for routine one month  follow up radiation treatment of left breast mastectomy . Skin has healed very well.Denies pain except sore throat. Started taking femara 07/29/2011.

## 2011-08-05 ENCOUNTER — Other Ambulatory Visit: Payer: Medicare Other

## 2011-08-05 ENCOUNTER — Ambulatory Visit: Payer: Medicare Other | Admitting: Oncology

## 2011-08-07 ENCOUNTER — Ambulatory Visit: Payer: Medicare Other | Admitting: Radiation Oncology

## 2011-08-14 ENCOUNTER — Encounter (HOSPITAL_BASED_OUTPATIENT_CLINIC_OR_DEPARTMENT_OTHER): Payer: Self-pay | Admitting: *Deleted

## 2011-08-20 ENCOUNTER — Encounter (HOSPITAL_BASED_OUTPATIENT_CLINIC_OR_DEPARTMENT_OTHER): Payer: Self-pay

## 2011-08-20 ENCOUNTER — Encounter (HOSPITAL_BASED_OUTPATIENT_CLINIC_OR_DEPARTMENT_OTHER)
Admission: RE | Admit: 2011-08-20 | Discharge: 2011-08-20 | Disposition: A | Discharge status: Home / Self Care | Payer: Medicare Other | Source: Ambulatory Visit | Attending: Surgery | Admitting: Surgery

## 2011-08-20 LAB — POCT I-STAT, CHEM 8
BUN: 19 mg/dL (ref 6–23)
Creatinine, Ser: 1 mg/dL (ref 0.50–1.10)
Potassium: 4.1 mEq/L (ref 3.5–5.1)
Sodium: 139 mEq/L (ref 135–145)

## 2011-08-21 ENCOUNTER — Encounter (HOSPITAL_BASED_OUTPATIENT_CLINIC_OR_DEPARTMENT_OTHER)
Admission: RE | Disposition: A | Discharge status: Home / Self Care | Payer: Self-pay | Source: Ambulatory Visit | Attending: Surgery

## 2011-08-21 ENCOUNTER — Encounter (HOSPITAL_BASED_OUTPATIENT_CLINIC_OR_DEPARTMENT_OTHER): Payer: Self-pay | Admitting: Anesthesiology

## 2011-08-21 ENCOUNTER — Ambulatory Visit (HOSPITAL_BASED_OUTPATIENT_CLINIC_OR_DEPARTMENT_OTHER): Payer: Medicare Other | Admitting: Anesthesiology

## 2011-08-21 ENCOUNTER — Encounter (HOSPITAL_BASED_OUTPATIENT_CLINIC_OR_DEPARTMENT_OTHER): Payer: Self-pay

## 2011-08-21 ENCOUNTER — Ambulatory Visit (HOSPITAL_BASED_OUTPATIENT_CLINIC_OR_DEPARTMENT_OTHER)
Admission: RE | Admit: 2011-08-21 | Discharge: 2011-08-21 | Disposition: A | Discharge status: Home / Self Care | Payer: Medicare Other | Source: Ambulatory Visit | Attending: Surgery | Admitting: Surgery

## 2011-08-21 DIAGNOSIS — Z853 Personal history of malignant neoplasm of breast: Secondary | ICD-10-CM | POA: Insufficient documentation

## 2011-08-21 DIAGNOSIS — I10 Essential (primary) hypertension: Secondary | ICD-10-CM | POA: Insufficient documentation

## 2011-08-21 DIAGNOSIS — Z9010 Acquired absence of unspecified breast and nipple: Secondary | ICD-10-CM | POA: Insufficient documentation

## 2011-08-21 DIAGNOSIS — K219 Gastro-esophageal reflux disease without esophagitis: Secondary | ICD-10-CM | POA: Insufficient documentation

## 2011-08-21 DIAGNOSIS — Z452 Encounter for adjustment and management of vascular access device: Secondary | ICD-10-CM | POA: Insufficient documentation

## 2011-08-21 DIAGNOSIS — C50419 Malignant neoplasm of upper-outer quadrant of unspecified female breast: Secondary | ICD-10-CM

## 2011-08-21 DIAGNOSIS — R51 Headache: Secondary | ICD-10-CM | POA: Insufficient documentation

## 2011-08-21 DIAGNOSIS — Z01812 Encounter for preprocedural laboratory examination: Secondary | ICD-10-CM | POA: Insufficient documentation

## 2011-08-21 DIAGNOSIS — F411 Generalized anxiety disorder: Secondary | ICD-10-CM | POA: Insufficient documentation

## 2011-08-21 HISTORY — PX: PORT-A-CATH REMOVAL: SHX5289

## 2011-08-21 SURGERY — REMOVAL PORT-A-CATH
Anesthesia: Monitor Anesthesia Care | Site: Chest | Laterality: Right | Wound class: Clean

## 2011-08-21 MED ORDER — ONDANSETRON HCL 4 MG/2ML IJ SOLN
INTRAMUSCULAR | Status: DC | PRN
  Administered 2011-08-21: 4 mg via INTRAVENOUS

## 2011-08-21 MED ORDER — LACTATED RINGERS IV SOLN
INTRAVENOUS | Status: DC
  Administered 2011-08-21: 13:00:00 via INTRAVENOUS

## 2011-08-21 MED ORDER — LIDOCAINE HCL 1 % IJ SOLN
INTRAMUSCULAR | Status: DC | PRN
  Administered 2011-08-21: 15:00:00 via INTRAMUSCULAR

## 2011-08-21 MED ORDER — SCOPOLAMINE 1 MG/3DAYS TD PT72
1.0000 | MEDICATED_PATCH | TRANSDERMAL | Status: DC
  Administered 2011-08-21: 1.5 mg via TRANSDERMAL

## 2011-08-21 MED ORDER — LIDOCAINE HCL (CARDIAC) 20 MG/ML IV SOLN
INTRAVENOUS | Status: DC | PRN
  Administered 2011-08-21: 45 mg via INTRAVENOUS

## 2011-08-21 MED ORDER — PROPOFOL 10 MG/ML IV EMUL
INTRAVENOUS | Status: DC | PRN
  Administered 2011-08-21: 200 ug/kg/min via INTRAVENOUS

## 2011-08-21 MED ORDER — GLYCOPYRROLATE 0.2 MG/ML IJ SOLN
INTRAMUSCULAR | Status: DC | PRN
  Administered 2011-08-21 (×2): 0.1 mg via INTRAVENOUS

## 2011-08-21 SURGICAL SUPPLY — 30 items
BLADE SURG 15 STRL LF DISP TIS (BLADE) ×1 IMPLANT
BLADE SURG 15 STRL SS (BLADE) ×1
CHLORAPREP W/TINT 26ML (MISCELLANEOUS) ×2 IMPLANT
CLOTH BEACON ORANGE TIMEOUT ST (SAFETY) ×2 IMPLANT
COVER MAYO STAND STRL (DRAPES) ×2 IMPLANT
COVER TABLE BACK 60X90 (DRAPES) ×2 IMPLANT
DECANTER SPIKE VIAL GLASS SM (MISCELLANEOUS) ×2 IMPLANT
DERMABOND ADVANCED (GAUZE/BANDAGES/DRESSINGS) ×1
DERMABOND ADVANCED .7 DNX12 (GAUZE/BANDAGES/DRESSINGS) ×1 IMPLANT
DRAPE PED LAPAROTOMY (DRAPES) ×2 IMPLANT
DRAPE UTILITY XL STRL (DRAPES) ×2 IMPLANT
ELECT REM PT RETURN 9FT ADLT (ELECTROSURGICAL) ×2
ELECTRODE REM PT RTRN 9FT ADLT (ELECTROSURGICAL) ×1 IMPLANT
GAUZE SPONGE 4X4 12PLY STRL LF (GAUZE/BANDAGES/DRESSINGS) ×2 IMPLANT
GLOVE BIO SURGEON STRL SZ7.5 (GLOVE) ×2 IMPLANT
GLOVE BIOGEL PI IND STRL 7.5 (GLOVE) ×1 IMPLANT
GLOVE BIOGEL PI INDICATOR 7.5 (GLOVE) ×1
GLOVE ECLIPSE 6.5 STRL STRAW (GLOVE) ×2 IMPLANT
GLOVE EUDERMIC 7 POWDERFREE (GLOVE) ×2 IMPLANT
GOWN PREVENTION PLUS XLARGE (GOWN DISPOSABLE) ×6 IMPLANT
NEEDLE HYPO 25X1 1.5 SAFETY (NEEDLE) ×2 IMPLANT
PACK BASIN DAY SURGERY FS (CUSTOM PROCEDURE TRAY) ×2 IMPLANT
PENCIL BUTTON HOLSTER BLD 10FT (ELECTRODE) ×2 IMPLANT
SLEEVE SCD COMPRESS KNEE MED (MISCELLANEOUS) IMPLANT
SUT MNCRL AB 4-0 PS2 18 (SUTURE) ×2 IMPLANT
SUT VICRYL 3-0 CR8 SH (SUTURE) ×2 IMPLANT
SYR CONTROL 10ML LL (SYRINGE) ×2 IMPLANT
TOWEL OR 17X24 6PK STRL BLUE (TOWEL DISPOSABLE) ×2 IMPLANT
TOWEL OR NON WOVEN STRL DISP B (DISPOSABLE) IMPLANT
WATER STERILE IRR 1000ML POUR (IV SOLUTION) IMPLANT

## 2011-08-21 NOTE — Anesthesia Postprocedure Evaluation (Signed)
  Anesthesia Post-op Note  Patient: Vanessa Mills  Procedure(s) Performed: Procedure(s) (LRB): REMOVAL PORT-A-CATH (Right)  Patient Location: PACU  Anesthesia Type: MAC  Level of Consciousness: awake, alert  and oriented  Airway and Oxygen Therapy: Patient Spontanous Breathing  Post-op Pain: none  Post-op Assessment: Post-op Vital signs reviewed, Patient's Cardiovascular Status Stable, Respiratory Function Stable, Patent Airway, No signs of Nausea or vomiting, Adequate PO intake and Pain level controlled  Post-op Vital Signs: Reviewed and stable  Complications: No apparent anesthesia complications

## 2011-08-21 NOTE — Transfer of Care (Signed)
Immediate Anesthesia Transfer of Care Note  Patient: Vanessa Mills  Procedure(s) Performed: Procedure(s) (LRB): REMOVAL PORT-A-CATH (Right)  Patient Location: PACU  Anesthesia Type: MAC  Level of Consciousness: awake, alert , oriented and patient cooperative  Airway & Oxygen Therapy: Patient Spontanous Breathing and Patient connected to face mask oxygen  Post-op Assessment: Report given to PACU RN and Post -op Vital signs reviewed and stable  Post vital signs: Reviewed and stable  Complications: No apparent anesthesia complications

## 2011-08-21 NOTE — Anesthesia Procedure Notes (Signed)
Procedure Name: MAC Date/Time: 08/21/2011 3:10 PM Performed by: Denny Levy Pre-anesthesia Checklist: Patient identified, Timeout performed, Emergency Drugs available, Suction available and Patient being monitored Patient Re-evaluated:Patient Re-evaluated prior to inductionOxygen Delivery Method: Simple face mask

## 2011-08-21 NOTE — Op Note (Signed)
Vanessa Mills [birth year] [id number] 07/11/2011  Preoperative diagnosis: Un-Needed PAC  Postoperative diagnosis: Same  Procedure: Portacath Removal  Surgeon: Haywood Lasso, MD, FACS  Anesthesia:MAC   Clinical History and Indications: The patient has finished her chemotherapy and no longer needs a port. She wishes to have it removed.  Procedure: The patient was seen in the preoperative area and we confirmed the plans for the procedure as noted above. The Port-A-Cath site was identified and marked. The patient had no further questions.  The patient was then taken into the procedure room. The timeout was done. IV sedation was obtained. The area was prepped and draped.The area over the Port-A-Cath was anesthetized with a mixture of 0.5% marcaine and 1% Xylocaine with epinephrine.  The old scar was opened. The capsule around the port opened and the port identified. The holding sutures were cut. The catheter was backed partially out of its tract. A figure 8 3-0 Vicryl suture was placed, the tubing removed, and the suture tied down to prevent backbleeding.  The port was then removed from its pocket. I made sure everything was dry. The incision was closed with 3-0 Vicryl, 4-0 Monocryl subcuticular, and Dermabond.  The patient tolerated the procedure well. There were no complications.  Haywood Lasso, MD, FACS 08/21/2011 3:30 PM

## 2011-08-21 NOTE — Anesthesia Preprocedure Evaluation (Addendum)
Anesthesia Evaluation  Patient identified by MRN, date of birth, ID band Patient awake    Reviewed: Allergy & Precautions, H&P , NPO status , Patient's Chart, lab work & pertinent test results, reviewed documented beta blocker date and time   History of Anesthesia Complications (+) PONV  Airway Mallampati: II TM Distance: >3 FB Neck ROM: Full    Dental  (+) Dental Advisory Given and Poor Dentition   Pulmonary neg pulmonary ROS,  breath sounds clear to auscultation  Pulmonary exam normal       Cardiovascular hypertension, Pt. on medications and Pt. on home beta blockers Rhythm:Regular Rate:Normal  '12 ECHO: normal LVF, except for grade 2 diastolic dysfunction, EF 96-75%, valves OK   Neuro/Psych  Headaches, PSYCHIATRIC DISORDERS Anxiety Benign essential tremor    GI/Hepatic Neg liver ROS, GERD-  Medicated and Controlled,  Endo/Other  negative endocrine ROS  Renal/GU negative Renal ROS     Musculoskeletal   Abdominal   Peds  Hematology   Anesthesia Other Findings Breast cancer: chemo and surgery  Reproductive/Obstetrics                          Anesthesia Physical Anesthesia Plan  ASA: II  Anesthesia Plan: MAC   Post-op Pain Management:    Induction:   Airway Management Planned: Nasal Cannula  Additional Equipment:   Intra-op Plan:   Post-operative Plan:   Informed Consent: I have reviewed the patients History and Physical, chart, labs and discussed the procedure including the risks, benefits and alternatives for the proposed anesthesia with the patient or authorized representative who has indicated his/her understanding and acceptance.     Plan Discussed with: CRNA and Surgeon  Anesthesia Plan Comments: (Plan routine monitors, MAC)        Anesthesia Quick Evaluation

## 2011-08-21 NOTE — H&P (Signed)
  NAME: Vanessa Mills DOB: [birth year]  DATE: 08/21/2011 MRN: [id number]  Vanessa Mills is a 51 y.o.Marland Kitchenfemale who presents for portacath removal  PFSH:  She has had no significant changes since the last visit here.  ROS:  There have been no significant changes since the last visit here  EXAM:  VS: BP 124/85  Pulse 60  Temp 97 F (36.1 C) (Temporal)  Ht 5' 8.5" (1.74 m)  Wt 166 lb 3.2 oz (75.388 kg)  BMI 24.90 kg/m2  SpO2 98%  Heart: Regular rhythm no murmurs rubs or gallops Lungs: Normal respirations clear to auscultation General: The patient is alert, oriented, generally healthy appearing, NAD. Mood and affect are normal.  Breasts:  Right is negative. Left s/p mastectomy with some acute radiation changes, esp. Laterally.  Lymphatics:  She has no axillary or supraclavicular adenopathy on either side.  Extremities: Full ROM of the surgical side with no lymphedema noted.  Data Reviewed:  Epic notes  Impression:  Doing well, with no evidence of recurrent cancer or new cancer  Plan:  Port removal today under IV sedation anesthesia

## 2011-08-22 ENCOUNTER — Encounter (HOSPITAL_BASED_OUTPATIENT_CLINIC_OR_DEPARTMENT_OTHER): Payer: Self-pay | Admitting: Surgery

## 2011-09-18 ENCOUNTER — Ambulatory Visit (HOSPITAL_BASED_OUTPATIENT_CLINIC_OR_DEPARTMENT_OTHER): Payer: Medicare Other | Admitting: Oncology

## 2011-09-18 ENCOUNTER — Encounter: Payer: Self-pay | Admitting: Oncology

## 2011-09-18 ENCOUNTER — Other Ambulatory Visit (HOSPITAL_BASED_OUTPATIENT_CLINIC_OR_DEPARTMENT_OTHER): Payer: Medicare Other | Admitting: Lab

## 2011-09-18 VITALS — BP 115/74 | HR 42 | Temp 98.4°F | Resp 20 | Ht 68.0 in | Wt 171.3 lb

## 2011-09-18 DIAGNOSIS — Z170 Estrogen receptor positive status [ER+]: Secondary | ICD-10-CM

## 2011-09-18 DIAGNOSIS — C50419 Malignant neoplasm of upper-outer quadrant of unspecified female breast: Secondary | ICD-10-CM

## 2011-09-18 DIAGNOSIS — R5381 Other malaise: Secondary | ICD-10-CM

## 2011-09-18 DIAGNOSIS — R5383 Other fatigue: Secondary | ICD-10-CM

## 2011-09-18 DIAGNOSIS — E559 Vitamin D deficiency, unspecified: Secondary | ICD-10-CM

## 2011-09-18 LAB — CBC WITH DIFFERENTIAL/PLATELET
Basophils Absolute: 0 10*3/uL (ref 0.0–0.1)
Eosinophils Absolute: 0.1 10*3/uL (ref 0.0–0.5)
HCT: 40.6 % (ref 34.8–46.6)
HGB: 13.7 g/dL (ref 11.6–15.9)
MCV: 90 fL (ref 79.5–101.0)
MONO%: 6.5 % (ref 0.0–14.0)
NEUT#: 3.5 10*3/uL (ref 1.5–6.5)
NEUT%: 69.6 % (ref 38.4–76.8)
Platelets: 286 10*3/uL (ref 145–400)
RDW: 12.4 % (ref 11.2–14.5)

## 2011-09-18 MED ORDER — LETROZOLE 2.5 MG PO TABS: 2.5000 mg | ORAL_TABLET | Freq: Every day | ORAL | Status: AC

## 2011-09-18 NOTE — Patient Instructions (Addendum)
Continue letrozole 2.5 mg daily  exercise and eat healthy  I will see you back in 6 months  Join the [facility] ( our cancer survivor program coming up in September)  Calcium and vitamin D (caltrate) 2 daily  Vitamin D3 2000 iu daily  CT scans prior to next visit  Bone density scan

## 2011-09-18 NOTE — Progress Notes (Signed)
OFFICE PROGRESS NOTE  CC  [name], MD  [name], MD [place] [address]  DIAGNOSIS: 51 year old female with:  1.  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  PRIOR THERAPY:  #1 status post mastectomy with axillary lymph node dissection for a stage IIIa invasive lobular carcinoma measuring 6 x 8 cm with 6 positive lymph nodes.  #2 S/P 4 cycles of AC q 2 weeks with day 2 neulasta  #3 She is status post weekly Taxol starting on 01/31/2011 -  Completed 04/18/11. She received total of 12 weeks.  #4. S/p Radiation therapy to the left chest wall from 4/17 - 07/01/11  CURRENT THERAPY: Adjuvant letrozole 2.5 mg daily  INTERVAL HISTORY: Vanessa Mills 51 y.o. female returns for followup visit today.. Patient completed her radiation therapy on 07/01/2011. Overall she tolerated it well except for some fatigue. She did develop significant skin changes from the radiation. She today feels well but tired she has no nausea vomiting fevers chills night sweats headaches no chest pains no palpitations no shortness of breath no bleeding problems. Patient is ready to begin adjuvant antiestrogen therapy. But it is uncertain whether she is premenopausal postmenopausal. We discussed this at great length today. Remainder of the 10 point review of systems is negative.  MEDICAL HISTORY: Past Medical History  Diagnosis Date  . Chronic mental illness   . Meniere's syndrome   . Crohn's disease   . Raynaud's disease   . GERD (gastroesophageal reflux disease)     does not take medications for   . Headache     takes midodrine  for migraines prn  . Breast cancer, ILC, Left, receptor+, Her2- 10/10/2010  . Mental disorder     bipolar, takes saphris at hs  . Chemotherapy follow-up examination 12/28/2010  . Fatigue 12/28/2010  . Breast cancer 03/07/2011  . Bipolar 1 disorder   . S/P radiation therapy 05/15/11 - 07/01/11    Left Breast: 4500 cGy/25 fractions with Boost to Left chest Wall/Mastectomy Scar for Toal dose of 5940 cGy  . Status post chemotherapy     4 cycles AC  . Status post chemotherapy 01/31/11 - 04/18/11    Taxol x 12 weeks  . PONV (postoperative nausea and vomiting)     ALLERGIES:   has no known allergies.  MEDICATIONS:  Current Outpatient Prescriptions  Medication Sig Dispense Refill  . ALPRAZolam (XANAX) 1 MG tablet Take 1 mg by mouth daily as needed.        [name] asenapine (SAPHRIS) 5 MG SUBL Place 10 mg under the tongue at bedtime.       . diazepam (VALIUM) 10 MG tablet as needed.       . isometheptene-acetaminophen-dichloralphenazone (MIDRIN) 65-325-100 MG capsule as needed.       . Multiple Vitamin (MULTIVITAMIN) capsule Take 1 capsule by mouth daily.        . propranolol (INDERAL) 60 MG tablet Take 60 mg by mouth 2 (two) times daily.        [name] triamterene-hydrochlorothiazide (DYAZIDE) 37.5-25 MG per capsule Take 1 capsule by mouth every morning.          SURGICAL HISTORY:  Past  Surgical History  Procedure Date  . Bladder tuck   . 2 orbital fracture surgeries   . Sinus exploration   . Bladder suspension     done 2005  . Mastectomy modified radical 10/23/2010    Left Dr Margot Chimes  . Portacath placement 11/28/2010    via right subclavian - Dr Margot Chimes  . Abdominal hysterectomy     uterus removed only  . Breast surgery     Axillary dissection  . Port-a-cath removal 08/21/2011    Procedure: REMOVAL PORT-A-CATH;  Surgeon: Haywood Lasso, MD;  Location: [place];  Service: General;  Laterality: Right;    REVIEW OF SYSTEMS:  A comprehensive review of systems was negative.    PHYSICAL EXAMINATION:  Awake alert NAD Heen: eomi perrla, sclera anicteric Lungs; clear CVS: RRR no murmurs ABD: soft NT/ND no HSM EXT: no C/C/E Neuro: nonfocal Right breast; no masses or nipple discharge, left mastectomy incision well healed ECOG PERFORMANCE STATUS: 1 - Symptomatic but completely ambulatory  Blood pressure 115/74, pulse 42, temperature 98.4 F (36.9 C), temperature source Oral, resp. rate 20, height 5' 8"  (1.727 m), weight 171 lb 4.8 oz (77.701 kg).  LABORATORY DATA: Lab Results  Component Value Date   WBC 5.1 09/18/2011   HGB 13.7 09/18/2011   HCT 40.6 09/18/2011   MCV 90.0 09/18/2011   PLT 286 09/18/2011      Chemistry      Component Value Date/Time   NA 139 08/20/2011 1102   K 4.1 08/20/2011 1102   CL 103 08/20/2011 1102   CO2 31 07/19/2011 0839   BUN 19 08/20/2011 1102   CREATININE 1.00 08/20/2011 1102      Component Value Date/Time   CALCIUM 10.1 07/19/2011 0839   ALKPHOS 68 07/19/2011 0839   AST 15 07/19/2011 0839   ALT 11 07/19/2011 0839   BILITOT 0.3 07/19/2011 0839       RADIOGRAPHIC STUDIES:  No results found.  ASSESSMENT: 51 year old female with:  1.  stage III breast cancer s/p mastectomy of the left breast which showed a 5 cm mass with 6 positive lymph nodes lobular features.  2.  Patient is status post 4 cycles of dose dense Adriamycin and Cytoxan which she tolerated very well. She is status post 12 weeks of Taxol. Overall she has tolerated this quite well.  #3 patient began antiestrogen therapy with letrozole   PLAN:  #1 Doing well on letrozole  #2 Return in 6 months time with re-staging scans prior to visit  #3. Join [facility] for survivorship  All questions were answered. The patient knows to call the clinic with any problems, questions or concerns. We can certainly see the patient much sooner if necessary.  I spent >25 minutes counseling and coordination of care. The total time spent in the appointment was 30 minutes.    Marcy Panning, MD Medical/Oncology [facility] [phone] (beeper) [phone] (Office)  09/18/2011, 1:50 PM

## 2011-09-19 ENCOUNTER — Telehealth: Payer: Self-pay | Admitting: *Deleted

## 2011-09-19 LAB — COMPREHENSIVE METABOLIC PANEL
Albumin: 4.3 g/dL (ref 3.5–5.2)
Alkaline Phosphatase: 80 U/L (ref 39–117)
BUN: 16 mg/dL (ref 6–23)
Calcium: 10 mg/dL (ref 8.4–10.5)
Creatinine, Ser: 0.95 mg/dL (ref 0.50–1.10)
Glucose, Bld: 71 mg/dL (ref 70–99)
Potassium: 3.8 mEq/L (ref 3.5–5.3)

## 2011-09-19 NOTE — Telephone Encounter (Signed)
Gave patient appointment for 03-02-2012 ct scan gave patient appoitment for lab and md 03-05-2012

## 2011-09-20 ENCOUNTER — Telehealth: Payer: Self-pay | Admitting: *Deleted

## 2011-09-20 NOTE — Telephone Encounter (Signed)
Per MD, LMOVM for pt, labs ok take Vitamin D3 as discussed. Pt to call to confirm message received.

## 2011-09-20 NOTE — Telephone Encounter (Signed)
Message copied by Lucile Crater on Fri Sep 20, 2011 10:39 AM ------      Message from: Deatra Robinson      Created: Thu Sep 19, 2011  9:12 AM       Call patient:labs ok take vitamin D3 as discussed

## 2011-11-08 ENCOUNTER — Other Ambulatory Visit: Payer: Self-pay | Admitting: Emergency Medicine

## 2011-11-08 MED ORDER — ALENDRONATE SODIUM 70 MG PO TABS: 70.0000 mg | ORAL_TABLET | ORAL | Status: DC

## 2011-11-08 NOTE — Telephone Encounter (Signed)
Per MD, advised patient to take Caltrate Po BID.   Patient states she is currently taking Vitamin D3 2000 iu daily.  Notified patient that a prescription for Fosamax was called into her pharmacy and for her to start taking that as well.  Patient advised to call with any questions or concerns.

## 2011-11-13 ENCOUNTER — Encounter (INDEPENDENT_AMBULATORY_CARE_PROVIDER_SITE_OTHER): Payer: Self-pay | Admitting: Surgery

## 2011-11-13 ENCOUNTER — Ambulatory Visit (INDEPENDENT_AMBULATORY_CARE_PROVIDER_SITE_OTHER): Payer: Medicare Other | Admitting: Surgery

## 2011-11-13 VITALS — BP 127/83 | HR 80 | Temp 97.8°F | Resp 12 | Ht 69.0 in | Wt 172.8 lb

## 2011-11-13 DIAGNOSIS — Z853 Personal history of malignant neoplasm of breast: Secondary | ICD-10-CM

## 2011-11-13 NOTE — Patient Instructions (Addendum)
Have the radiologist send me a copy of the mammogram.  Discuss prophylactic mastectomy with Dr Humphrey Rolls when you see her in Feb.

## 2011-11-13 NOTE — Progress Notes (Signed)
NAME: [name]       DOB: 1960-08-13           DATE: 11/13/2011       MRN: [id number]   Vanessa Mills is a 51 y.o.Marland Kitchenfemale who presents for routine followup of her Left breast cancer, ILC, diagnosed in 2012 and treated with mastectomy, chemo and  radiation. She has no problems or concerns on either side. She is thinking about a prophylactic mastectomy  PFSH: She has had no significant changes since the last visit here.  ROS: There have been no significant changes since the last visit here  EXAM:  VS: BP 127/83  Pulse 80  Temp 97.8 F (36.6 C) (Temporal)  Resp 12  Ht 5' 9"  (1.753 m)  Wt 172 lb 12.8 oz (78.382 kg)  BMI 25.52 kg/m2   General: The patient is alert, oriented, generally healthy appearing, NAD. Mood and affect are normal.  Breasts:  Right is negative. Left s/p mastectomy with minimal post radiation changes  Lymphatics: She has no axillary or supraclavicular adenopathy on either side.  Extremities: Full ROM of the surgical side with no lymphedema noted.  Data Reviewed: Epic notes. Mammogram not done yet  Impression: Doing well, with no evidence of recurrent cancer or new cancer  Plan: Will continue to follow up on an annual basis here. Reviewed prophylactic mastectomy, but recommended she discuss with Dr Humphrey Rolls when she sees her in February

## 2011-12-05 ENCOUNTER — Telehealth: Payer: Self-pay | Admitting: *Deleted

## 2011-12-05 MED ORDER — ZOLPIDEM TARTRATE 5 MG PO TABS: 5.0000 mg | ORAL_TABLET | Freq: Every evening | ORAL | Status: DC | PRN

## 2011-12-05 NOTE — Telephone Encounter (Signed)
Ambien 53m PO QHS.

## 2011-12-05 NOTE — Telephone Encounter (Signed)
Pt called states " I'm having trouble sleeping at night and was wondering if MD would prescribe her something to sleep. My sister recommended Klonopin or Ambien." Will review with MD

## 2011-12-05 NOTE — Telephone Encounter (Signed)
Per NP, Rx for Ambien 68m 1 po at bedtime Q30 no refills. Rx sent to pt pharmacy

## 2012-01-21 ENCOUNTER — Other Ambulatory Visit: Payer: Self-pay | Admitting: *Deleted

## 2012-01-21 DIAGNOSIS — M419 Scoliosis, unspecified: Secondary | ICD-10-CM

## 2012-01-27 ENCOUNTER — Ambulatory Visit
Admission: RE | Admit: 2012-01-27 | Discharge: 2012-01-27 | Disposition: A | Discharge status: Home / Self Care | Payer: Medicare Other | Source: Ambulatory Visit | Attending: *Deleted | Admitting: *Deleted

## 2012-01-27 DIAGNOSIS — M419 Scoliosis, unspecified: Secondary | ICD-10-CM

## 2012-01-30 ENCOUNTER — Other Ambulatory Visit: Payer: Medicare Other

## 2012-02-03 ENCOUNTER — Ambulatory Visit: Payer: Medicare Other | Admitting: Radiation Oncology

## 2012-02-06 ENCOUNTER — Encounter: Payer: Self-pay | Admitting: Radiation Oncology

## 2012-02-06 ENCOUNTER — Ambulatory Visit
Admission: RE | Admit: 2012-02-06 | Discharge: 2012-02-06 | Disposition: A | Discharge status: Home / Self Care | Payer: Medicare Other | Source: Ambulatory Visit | Attending: Radiation Oncology | Admitting: Radiation Oncology

## 2012-02-06 DIAGNOSIS — C50419 Malignant neoplasm of upper-outer quadrant of unspecified female breast: Secondary | ICD-10-CM

## 2012-02-06 NOTE — Progress Notes (Signed)
Patient here for routine follow up completion of left breast radiation in June 2013.Denies pain or any other problems.Started femara.Mammogram performed at [facility] in December 2013.

## 2012-02-06 NOTE — Progress Notes (Signed)
  Radiation Oncology         [phone] ________________________________  Name: Vanessa Mills MRN: [id number]  Date: 02/06/2012  DOB: 06/07/1960  Follow-Up Visit Note  CC: [name], MD  Deatra Robinson, MD  Diagnosis:   Stage IIIa left breast cancer  Interval Since Last Radiation:  7 months  Narrative:  The patient returns today for routine follow-up.  She clinically is doing well at this time. She denies any pain along the left chest wall area,  discomfort or breathing problems. She denies any problems with swelling in her left arm or hand.                              ALLERGIES:   has no known allergies.  Meds: Current Outpatient Prescriptions  Medication Sig Dispense Refill  . ALPRAZolam (XANAX) 1 MG tablet Take 1 mg by mouth daily as needed.        [name] asenapine (SAPHRIS) 5 MG SUBL Place 10 mg under the tongue at bedtime.       . Calcium Carbonate-Vitamin D (CALTRATE 600+D PO) Take 600 mg by mouth 2 (two) times daily.      . diazepam (VALIUM) 10 MG tablet as needed.       . isometheptene-acetaminophen-dichloralphenazone (MIDRIN) 65-325-100 MG capsule as needed.       [name] letrozole (FEMARA) 2.5 MG tablet Take 2.5 mg by mouth daily.      . Multiple Vitamin (MULTIVITAMIN) capsule Take 1 capsule by mouth daily.        . propranolol (INDERAL) 60 MG tablet Take 60 mg by mouth 2 (two) times daily.        [name] triamterene-hydrochlorothiazide (DYAZIDE) 37.5-25 MG per capsule Take 1 capsule by mouth every morning.        . zolpidem (AMBIEN) 5 MG tablet Take 1 tablet (5 mg total) by mouth at bedtime as needed for sleep.  30 tablet  0    Physical Findings: The patient is in no acute distress. Patient is alert and oriented.  vitals were not taken for this visit.[name]  No palpable cervical supraclavicular or axillary adenopathy the lungs are clear to auscultation. The heart has a regular rhythm and rate. Examination of the right breast reveals no mass or nipple discharge. Examination of  the left chest wall area reveals some hyperpigmentation changes. There is no palpable or visible signs recurrence.  Lab Findings: Lab Results  Component Value Date   WBC 5.1 09/18/2011   HGB 13.7 09/18/2011   HCT 40.6 09/18/2011   MCV 90.0 09/18/2011   PLT 286 09/18/2011     Impression:  The patient is recovering from the effects of radiation.  No evidence for recurrence on exam today  Plan:  When necessary followup. Patient will continue close followup in medical oncology.  _____________________________________  -----------------------------------  Blair Promise, PhD, MD

## 2012-02-26 ENCOUNTER — Ambulatory Visit: Payer: Medicare Other | Attending: Rehabilitation | Admitting: Physical Therapy

## 2012-02-26 DIAGNOSIS — M545 Low back pain, unspecified: Secondary | ICD-10-CM | POA: Insufficient documentation

## 2012-02-26 DIAGNOSIS — IMO0001 Reserved for inherently not codable concepts without codable children: Secondary | ICD-10-CM | POA: Insufficient documentation

## 2012-02-26 DIAGNOSIS — M2569 Stiffness of other specified joint, not elsewhere classified: Secondary | ICD-10-CM | POA: Insufficient documentation

## 2012-03-02 ENCOUNTER — Ambulatory Visit (HOSPITAL_COMMUNITY)
Admission: RE | Admit: 2012-03-02 | Discharge: 2012-03-02 | Disposition: A | Discharge status: Home / Self Care | Payer: Medicare Other | Source: Ambulatory Visit | Attending: Oncology | Admitting: Oncology

## 2012-03-02 ENCOUNTER — Encounter (HOSPITAL_COMMUNITY): Payer: Self-pay

## 2012-03-02 ENCOUNTER — Other Ambulatory Visit (HOSPITAL_COMMUNITY): Payer: Medicare Other

## 2012-03-02 DIAGNOSIS — M5146 Schmorl's nodes, lumbar region: Secondary | ICD-10-CM | POA: Insufficient documentation

## 2012-03-02 DIAGNOSIS — I872 Venous insufficiency (chronic) (peripheral): Secondary | ICD-10-CM | POA: Insufficient documentation

## 2012-03-02 DIAGNOSIS — C50419 Malignant neoplasm of upper-outer quadrant of unspecified female breast: Secondary | ICD-10-CM

## 2012-03-02 DIAGNOSIS — K7689 Other specified diseases of liver: Secondary | ICD-10-CM | POA: Insufficient documentation

## 2012-03-02 DIAGNOSIS — N644 Mastodynia: Secondary | ICD-10-CM | POA: Insufficient documentation

## 2012-03-02 DIAGNOSIS — C50919 Malignant neoplasm of unspecified site of unspecified female breast: Secondary | ICD-10-CM | POA: Insufficient documentation

## 2012-03-02 DIAGNOSIS — Z9010 Acquired absence of unspecified breast and nipple: Secondary | ICD-10-CM | POA: Insufficient documentation

## 2012-03-02 DIAGNOSIS — R079 Chest pain, unspecified: Secondary | ICD-10-CM | POA: Insufficient documentation

## 2012-03-02 MED ORDER — IOHEXOL 300 MG/ML  SOLN
50.0000 mL | Freq: Once | INTRAMUSCULAR | Status: AC | PRN
  Administered 2012-03-02: 50 mL via ORAL

## 2012-03-02 MED ORDER — IOHEXOL 300 MG/ML  SOLN: 50.0000 mL | Freq: Once | INTRAMUSCULAR | Status: AC | PRN

## 2012-03-02 MED ORDER — IOHEXOL 300 MG/ML  SOLN
100.0000 mL | Freq: Once | INTRAMUSCULAR | Status: AC | PRN
  Administered 2012-03-02: 100 mL via INTRAVENOUS

## 2012-03-03 ENCOUNTER — Ambulatory Visit: Payer: Medicare Other | Admitting: Physical Therapy

## 2012-03-04 ENCOUNTER — Other Ambulatory Visit: Payer: Self-pay | Admitting: Medical Oncology

## 2012-03-04 DIAGNOSIS — C50419 Malignant neoplasm of upper-outer quadrant of unspecified female breast: Secondary | ICD-10-CM

## 2012-03-05 ENCOUNTER — Telehealth: Payer: Self-pay | Admitting: Oncology

## 2012-03-05 ENCOUNTER — Other Ambulatory Visit (HOSPITAL_BASED_OUTPATIENT_CLINIC_OR_DEPARTMENT_OTHER): Payer: Medicare Other | Admitting: Lab

## 2012-03-05 ENCOUNTER — Other Ambulatory Visit: Payer: Self-pay | Admitting: Medical Oncology

## 2012-03-05 ENCOUNTER — Ambulatory Visit (HOSPITAL_BASED_OUTPATIENT_CLINIC_OR_DEPARTMENT_OTHER): Payer: Medicare Other | Admitting: Oncology

## 2012-03-05 VITALS — BP 144/86 | HR 47 | Temp 98.0°F | Resp 20 | Ht 69.0 in | Wt 178.6 lb

## 2012-03-05 DIAGNOSIS — C50919 Malignant neoplasm of unspecified site of unspecified female breast: Secondary | ICD-10-CM

## 2012-03-05 DIAGNOSIS — C50419 Malignant neoplasm of upper-outer quadrant of unspecified female breast: Secondary | ICD-10-CM

## 2012-03-05 DIAGNOSIS — Z170 Estrogen receptor positive status [ER+]: Secondary | ICD-10-CM

## 2012-03-05 LAB — COMPREHENSIVE METABOLIC PANEL (CC13)
CO2: 29 mEq/L (ref 22–29)
Calcium: 10 mg/dL (ref 8.4–10.4)
Chloride: 104 mEq/L (ref 98–107)
Creatinine: 1.1 mg/dL (ref 0.6–1.1)
Glucose: 93 mg/dl (ref 70–99)
Total Bilirubin: 0.3 mg/dL (ref 0.20–1.20)

## 2012-03-05 LAB — CBC WITH DIFFERENTIAL/PLATELET
Basophils Absolute: 0 10*3/uL (ref 0.0–0.1)
Eosinophils Absolute: 0.1 10*3/uL (ref 0.0–0.5)
HCT: 41 % (ref 34.8–46.6)
HGB: 14.2 g/dL (ref 11.6–15.9)
LYMPH%: 25.4 % (ref 14.0–49.7)
MCHC: 34.6 g/dL (ref 31.5–36.0)
MONO#: 0.6 10*3/uL (ref 0.1–0.9)
NEUT#: 4.2 10*3/uL (ref 1.5–6.5)
NEUT%: 64.4 % (ref 38.4–76.8)
Platelets: 223 10*3/uL (ref 145–400)
WBC: 6.5 10*3/uL (ref 3.9–10.3)

## 2012-03-05 MED ORDER — ZOLPIDEM TARTRATE 5 MG PO TABS: 5.0000 mg | ORAL_TABLET | Freq: Every evening | ORAL | Status: DC | PRN

## 2012-03-05 MED ORDER — ZOLPIDEM TARTRATE 10 MG PO TABS: 10.0000 mg | ORAL_TABLET | Freq: Every evening | ORAL | Status: AC | PRN

## 2012-03-05 NOTE — Patient Instructions (Addendum)
Doing well  I will see you back in 6 months

## 2012-03-06 ENCOUNTER — Ambulatory Visit: Payer: Medicare Other | Attending: Rehabilitation | Admitting: Physical Therapy

## 2012-03-06 DIAGNOSIS — M545 Low back pain, unspecified: Secondary | ICD-10-CM | POA: Insufficient documentation

## 2012-03-06 DIAGNOSIS — M2569 Stiffness of other specified joint, not elsewhere classified: Secondary | ICD-10-CM | POA: Insufficient documentation

## 2012-03-06 DIAGNOSIS — IMO0001 Reserved for inherently not codable concepts without codable children: Secondary | ICD-10-CM | POA: Insufficient documentation

## 2012-03-09 ENCOUNTER — Encounter: Payer: Self-pay | Admitting: Oncology

## 2012-03-09 NOTE — Progress Notes (Signed)
OFFICE PROGRESS NOTE  CC  [name], MD  [name], MD [place] [address]  DIAGNOSIS: 52 year old female with:  1.  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  PRIOR THERAPY:  #1 status post mastectomy with axillary lymph node dissection for a stage IIIa invasive lobular carcinoma measuring 6 x 8 cm with 6 positive lymph nodes.  #2 S/P 4 cycles of AC q 2 weeks with day 2 neulasta  #3 She is status post weekly Taxol starting on 01/31/2011 -  Completed 04/18/11. She received total of 12 weeks.  #4. S/p Radiation therapy to the left chest wall from 4/17 - 07/01/11  #5 patient began adjuvant antiestrogen therapy with letrozole 2.5 mg daily in June 2013. A total of 5 years of therapy is planned.  CURRENT THERAPY: Adjuvant letrozole 2.5 mg daily  INTERVAL HISTORY: Vanessa Mills 52 y.o. female returns for followup visit today. Clinically patient looks terrific. She is having hot flashes with the letrozole. Otherwise no other complaints she has no nausea vomiting fevers chills night sweats. Patient has gained significant amount of weight. She tells me that she is having significant problems with her scoliosis pain. Because of this she is not able to exercise much. We extensively discussed exercise possibly doing water aerobics. Remainder of the 10 point review of systems is negative. MEDICAL HISTORY: Past Medical History  Diagnosis Date  . Chronic mental illness   . Meniere's syndrome   . Crohn's disease   . Raynaud's disease   . GERD (gastroesophageal reflux disease)     does not take medications for    . Headache     takes midodrine for migraines prn  . Breast cancer, ILC, Left, receptor+, Her2- 10/10/2010  . Mental disorder     bipolar, takes saphris at hs  . Chemotherapy follow-up examination 12/28/2010  . Fatigue 12/28/2010  . Breast cancer 03/07/2011  . Bipolar 1 disorder   . S/P radiation therapy 05/15/11 - 07/01/11    Left Breast: 4500 cGy/25 fractions with Boost to Left chest Wall/Mastectomy Scar for Toal dose of 5940 cGy  . Status post chemotherapy     4 cycles AC  . Status post chemotherapy 01/31/11 - 04/18/11    Taxol x 12 weeks  . PONV (postoperative nausea and vomiting)     ALLERGIES:  has No Known Allergies.  MEDICATIONS:  Current Outpatient Prescriptions  Medication Sig Dispense Refill  . ALPRAZolam (XANAX) 1 MG tablet Take 1 mg by mouth daily as needed.        [name] asenapine (SAPHRIS) 5 MG SUBL Place 10 mg under the tongue at bedtime.       . Calcium Carbonate-Vitamin D (CALTRATE 600+D PO) Take 600 mg by mouth 2 (two) times daily.      . clonazePAM (KLONOPIN) 2 MG tablet Take 2 mg by mouth Daily.      . diazepam (VALIUM) 10 MG tablet as needed.       . isometheptene-acetaminophen-dichloralphenazone (MIDRIN) 65-325-100 MG capsule as needed.       [name] letrozole (FEMARA) 2.5 MG tablet Take 2.5 mg by mouth daily.      . propranolol (INDERAL) 60  MG tablet Take 60 mg by mouth 2 (two) times daily.        [name] triamterene-hydrochlorothiazide (DYAZIDE) 37.5-25 MG per capsule Take 1 capsule by mouth every morning.        . zolpidem (AMBIEN) 5 MG tablet Take 1 tablet (5 mg total) by mouth at bedtime as needed for sleep.  30 tablet  0  . Multiple Vitamin (MULTIVITAMIN) capsule Take 1 capsule by mouth daily.        [name] zolpidem (AMBIEN) 10 MG tablet Take 1 tablet (10 mg total) by mouth at bedtime as needed for sleep.  30 tablet  0   No current facility-administered medications for this visit.    SURGICAL HISTORY:  Past Surgical History  Procedure Laterality Date  . Bladder tuck    . 2  orbital fracture surgeries    . Sinus exploration    . Bladder suspension      done 2005  . Mastectomy modified radical  10/23/2010    Left Dr Margot Chimes  . Portacath placement  11/28/2010    via right subclavian - Dr Margot Chimes  . Abdominal hysterectomy      uterus removed only  . Breast surgery      Axillary dissection  . Port-a-cath removal  08/21/2011    Procedure: REMOVAL PORT-A-CATH;  Surgeon: Haywood Lasso, MD;  Location: [facility];  Service: General;  Laterality: Right;    REVIEW OF SYSTEMS:  A comprehensive review of systems was negative.   PHYSICAL EXAMINATION:  Awake alert NAD Heen: eomi perrla, sclera anicteric Lungs; clear CVS: RRR no murmurs ABD: soft NT/ND no HSM EXT: no C/C/E Neuro: nonfocal Right breast; no masses or nipple discharge, left mastectomy incision well healed ECOG PERFORMANCE STATUS: 1 - Symptomatic but completely ambulatory  Blood pressure 144/86, pulse 47, temperature 98 F (36.7 C), resp. rate 20, height 5' 9"  (1.753 m), weight 178 lb 9.6 oz (81.012 kg).  LABORATORY DATA: Lab Results  Component Value Date   WBC 6.5 03/05/2012   HGB 14.2 03/05/2012   HCT 41.0 03/05/2012   MCV 91.5 03/05/2012   PLT 223 03/05/2012      Chemistry      Component Value Date/Time   NA 143 03/05/2012 1005   NA 141 09/18/2011 1305   K 3.5 03/05/2012 1005   K 3.8 09/18/2011 1305   CL 104 03/05/2012 1005   CL 103 09/18/2011 1305   CO2 29 03/05/2012 1005   CO2 30 09/18/2011 1305   BUN 15.1 03/05/2012 1005   BUN 16 09/18/2011 1305   CREATININE 1.1 03/05/2012 1005   CREATININE 0.95 09/18/2011 1305      Component Value Date/Time   CALCIUM 10.0 03/05/2012 1005   CALCIUM 10.0 09/18/2011 1305   ALKPHOS 93 03/05/2012 1005   ALKPHOS 80 09/18/2011 1305   AST 16 03/05/2012 1005   AST 17 09/18/2011 1305   ALT 11 03/05/2012 1005   ALT 12 09/18/2011 1305   BILITOT 0.30 03/05/2012 1005   BILITOT 0.4 09/18/2011 1305       RADIOGRAPHIC STUDIES:  No results found.  ASSESSMENT: 52 year  old female with:  1.  stage III breast cancer s/p mastectomy of the left breast which showed a 5 cm mass with 6 positive lymph nodes lobular features. Patient has no evidence of recurrent disease  2.  Patient is status post 4 cycles of dose dense Adriamycin and Cytoxan which she tolerated very well. She is status post 12 weeks  of Taxol. Overall she has tolerated this quite well.  #3 patient began antiestrogen therapy with letrozole in June 2013. Patient is tolerating it well.  PLAN:  #1 Doing well on letrozole  #2 Return in 6 months time     All questions were answered. The patient knows to call the clinic with any problems, questions or concerns. We can certainly see the patient much sooner if necessary.  I spent >25 minutes counseling and coordination of care. The total time spent in the appointment was 30 minutes.    Marcy Panning, MD Medical/Oncology [facility] [phone] (beeper) [phone] (Office)  03/09/2012, 4:50 PM

## 2012-03-22 IMAGING — PT NM PET TUM IMG INITIAL (PI) SKULL BASE T - THIGH
1 of 6 series · 1 of 25 positions shown · non-contrast
Comparison: 

CLINICAL DATA: Initial treatment strategy for breast cancer.  Left
mastectomy.

NUCLEAR MEDICINE PET SKULL BASE TO THIGH
Fasting Blood Glucose:  95
TECHNIQUE: 20.4 mCi F-18 FDG was injected intravenously via the
right hand.  Full-ring PET imaging was performed from the skull
base through the mid-thighs 60  minutes after injection.  CT data
was obtained and used for attenuation correction and anatomic
localization only.  (This was not acquired as a diagnostic CT
examination.)

[Series 1: pet ac · axial · 3.3mm · 4.69mm/px · 1 of 267 slices shown]
[im 134/267]
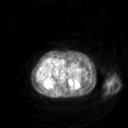

[1 of 25 positions shown; findings below may reference images not displayed]

FINDINGS: Neck:No hypermetabolic nodes in the neck.

Chest:There is mild hypermetabolic activity associated with the
lymphadenectomy site on the left.  No discrete hypermetabolic nodes
are noted.  Left mastectomy noted.  No hypermetabolic internal
mammary or mediastinal lymph nodes.  There are two cystic lesions
in the right breast without hypermetabolic activity.

No suspicious pulmonary nodules.  There is a calcified granuloma
within the right lower lobe.

Abdomen / Pelvis:There is a 2.5 cm low density lesion within the
right hepatic lobe which  does not have hypermetabolic activity and
therefore likely benign.  There is hypermetabolic activity
associate the stomach which is likely physiologic.  No
hypermetabolic abdominal or pelvic lymph nodes.

Skeleton:Review of  bone windows demonstrates no aggressive osseous
lesions.
IMPRESSION: No evidence of breast cancer metastasis.

## 2012-06-01 ENCOUNTER — Other Ambulatory Visit: Payer: Self-pay

## 2012-06-01 ENCOUNTER — Telehealth: Payer: Self-pay | Admitting: Medical Oncology

## 2012-06-01 MED ORDER — ALPRAZOLAM ER 1 MG PO TB24: 2.0000 mg | ORAL_TABLET | Freq: Two times a day (BID) | ORAL | Status: DC

## 2012-06-01 NOTE — Telephone Encounter (Signed)
Ok to refill 

## 2012-06-01 NOTE — Telephone Encounter (Signed)
Patient called saying she is a patient of Dr Krista Blue and has an appt scheduled, but needs one more refill to last until OV.

## 2012-06-01 NOTE — Telephone Encounter (Signed)
Pt LVMOM asking for ambien refill for 30 tablets to be called to her pharm. Will review with MD.  Next sched appt 08/04 lab/MD

## 2012-06-02 ENCOUNTER — Other Ambulatory Visit: Payer: Self-pay | Admitting: Medical Oncology

## 2012-06-02 MED ORDER — ZOLPIDEM TARTRATE 5 MG PO TABS: 5.0000 mg | ORAL_TABLET | Freq: Every evening | ORAL | Status: DC | PRN

## 2012-06-02 NOTE — Telephone Encounter (Signed)
Per MD, refill for ambien phoned in to pt's requested pharmacy. LVMOM informing patient. Patient to call back with questions or concerns.

## 2012-06-05 ENCOUNTER — Ambulatory Visit (INDEPENDENT_AMBULATORY_CARE_PROVIDER_SITE_OTHER): Payer: Medicare Other | Admitting: Nurse Practitioner

## 2012-06-05 ENCOUNTER — Encounter: Payer: Self-pay | Admitting: Nurse Practitioner

## 2012-06-05 VITALS — BP 114/73 | HR 54 | Ht 70.0 in | Wt 171.0 lb

## 2012-06-05 DIAGNOSIS — G243 Spasmodic torticollis: Secondary | ICD-10-CM

## 2012-06-05 DIAGNOSIS — G252 Other specified forms of tremor: Secondary | ICD-10-CM

## 2012-06-05 DIAGNOSIS — G250 Essential tremor: Secondary | ICD-10-CM | POA: Insufficient documentation

## 2012-06-05 DIAGNOSIS — R259 Unspecified abnormal involuntary movements: Secondary | ICD-10-CM

## 2012-06-05 MED ORDER — ALPRAZOLAM ER 1 MG PO TB24: 2.0000 mg | ORAL_TABLET | Freq: Two times a day (BID) | ORAL | Status: DC

## 2012-06-05 MED ORDER — PROPRANOLOL HCL 60 MG PO TABS: 60.0000 mg | ORAL_TABLET | Freq: Two times a day (BID) | ORAL | Status: DC

## 2012-06-05 NOTE — Progress Notes (Signed)
HPI: Patient returns for a yearly followup. 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 Her hand tremor and head titubation first occurred in 2004, originally seen by Dr. Doy Mince and treated with propanolol which has been very beneficial. She also takes Xanax. She also received Botox for her cervical dystonia and head titubation since 2006 however she stopped the Botox in 2012 because she felt it was no longer beneficial. She also has a history of breast cancer and just recently had her Port-A-Cath removed. Bipolar disorder treated by psych.   ROS:  Tremors  Physical Exam General: well developed, well nourished, seated, in no evident distress Head: head normocephalic and atraumatic. Oropharynx benign. Mild tremor of the head Neck: supple with no carotid or supraclavicular bruits Cardiovascular: regular rate and rhythm, no murmurs  Neurologic Exam Mental Status: Awake and fully alert. Oriented to place and time. Follows 1, 2 and 3 step commands,  speech and language are normal   Cranial Nerves:  Pupils equal, briskly reactive to light. Extraocular movements full without nystagmus. Visual fields full to confrontation. Hearing intact and symmetric to finger snap. Facial sensation intact. Face, tongue, palate move normally and symmetrically.   Motor: Normal bulk and tone. Normal strength in all tested extremity muscles. No focal weakness. Mild essential tremor Coordination: Rapid alternating movements normal in all extremities. Finger-to-nose and heel-to-shin performed accurately bilaterally. Gait and Station: Arises from chair without difficulty. Stance is normal. Gait demonstrates normal stride length and balance . Able to heel, toe and tandem walk without difficulty.  Reflexes: 1+ and symmetric. Toes are neutral  ASSESSMENT: History of cervical dystonia and patient does not want further Botox treatments Bilateral essential tremor that has responded well to  propanolol     PLAN: Continue propanolol and Xanax as directed, written prescriptions for both as requested Followup yearly  Dennie Bible, GNP-BC APRN

## 2012-06-05 NOTE — Patient Instructions (Addendum)
Continue current meds Follow up yearly

## 2012-07-15 ENCOUNTER — Other Ambulatory Visit: Payer: Self-pay | Admitting: Adult Health

## 2012-07-23 ENCOUNTER — Other Ambulatory Visit: Payer: Self-pay | Admitting: *Deleted

## 2012-07-23 MED ORDER — ZOLPIDEM TARTRATE 5 MG PO TABS: 5.0000 mg | ORAL_TABLET | Freq: Every evening | ORAL | Status: DC | PRN

## 2012-07-23 NOTE — Telephone Encounter (Signed)
Rec'd refill request for Ambien 60m, however this was discontinued, and patient was given lower dose at 551m Called [facility] and they will send new refill request for 59m359mabs.

## 2012-08-04 ENCOUNTER — Telehealth: Payer: Self-pay

## 2012-08-04 NOTE — Telephone Encounter (Signed)
I contacted the pharmacy.  They will follow up with Dr Starleen Arms office regarding the meds he started after we had already been prescribing Xanax.

## 2012-08-04 NOTE — Telephone Encounter (Signed)
It would appear that the klonopin was started in Jan by Dr. Toy Care. She has been on Xanax for spasmodic torticolis for several years.

## 2012-08-04 NOTE — Telephone Encounter (Signed)
[facility] called, left message.  They would like to discuss the patients medication regiment.  We have prescribed Xanax XR, and the patient recently picked that up.  As well, Dr Toy Care prescribes Klonopin 88m and Diazepam 149m  They would like to know if the patient should be getting all of these medications?  Call back number [phone] Please advise.  Thank you.

## 2012-08-27 ENCOUNTER — Telehealth (INDEPENDENT_AMBULATORY_CARE_PROVIDER_SITE_OTHER): Payer: Self-pay | Admitting: *Deleted

## 2012-08-27 NOTE — Telephone Encounter (Signed)
Lattie Haw with [facility] called to request a prescription for patient to pickup supplies.  Explained that Dr. Margot Chimes is not available to give order at this time so urgent office MD will be asked for approval.  Once approval has been received the prescription will be faxed to [facility] at [facility] to [place].  Lattie Haw states understanding at this time.

## 2012-08-31 ENCOUNTER — Other Ambulatory Visit (HOSPITAL_BASED_OUTPATIENT_CLINIC_OR_DEPARTMENT_OTHER): Payer: Medicare Other | Admitting: Lab

## 2012-08-31 ENCOUNTER — Telehealth: Payer: Self-pay | Admitting: *Deleted

## 2012-08-31 ENCOUNTER — Ambulatory Visit (HOSPITAL_BASED_OUTPATIENT_CLINIC_OR_DEPARTMENT_OTHER): Payer: Medicare Other | Admitting: Oncology

## 2012-08-31 VITALS — BP 133/90 | HR 57 | Temp 98.5°F | Resp 20 | Ht 70.0 in | Wt 179.0 lb

## 2012-08-31 DIAGNOSIS — C773 Secondary and unspecified malignant neoplasm of axilla and upper limb lymph nodes: Secondary | ICD-10-CM

## 2012-08-31 DIAGNOSIS — C50419 Malignant neoplasm of upper-outer quadrant of unspecified female breast: Secondary | ICD-10-CM

## 2012-08-31 DIAGNOSIS — C50919 Malignant neoplasm of unspecified site of unspecified female breast: Secondary | ICD-10-CM

## 2012-08-31 DIAGNOSIS — C50912 Malignant neoplasm of unspecified site of left female breast: Secondary | ICD-10-CM

## 2012-08-31 DIAGNOSIS — E559 Vitamin D deficiency, unspecified: Secondary | ICD-10-CM

## 2012-08-31 LAB — COMPREHENSIVE METABOLIC PANEL (CC13)
ALT: 16 U/L (ref 0–55)
CO2: 30 mEq/L — ABNORMAL HIGH (ref 22–29)
Calcium: 10.3 mg/dL (ref 8.4–10.4)
Chloride: 102 mEq/L (ref 98–109)
Glucose: 92 mg/dl (ref 70–140)
Sodium: 143 mEq/L (ref 136–145)
Total Bilirubin: 0.55 mg/dL (ref 0.20–1.20)
Total Protein: 7.4 g/dL (ref 6.4–8.3)

## 2012-08-31 LAB — CBC WITH DIFFERENTIAL/PLATELET
Eosinophils Absolute: 0.1 10*3/uL (ref 0.0–0.5)
HCT: 42 % (ref 34.8–46.6)
LYMPH%: 30.7 % (ref 14.0–49.7)
MONO#: 0.4 10*3/uL (ref 0.1–0.9)
NEUT#: 2.7 10*3/uL (ref 1.5–6.5)
NEUT%: 57.7 % (ref 38.4–76.8)
Platelets: 278 10*3/uL (ref 145–400)
RBC: 4.55 10*6/uL (ref 3.70–5.45)
WBC: 4.8 10*3/uL (ref 3.9–10.3)
lymph#: 1.5 10*3/uL (ref 0.9–3.3)

## 2012-08-31 MED ORDER — ZOLPIDEM TARTRATE 10 MG PO TABS: 10.0000 mg | ORAL_TABLET | Freq: Every evening | ORAL | Status: AC | PRN

## 2012-08-31 NOTE — Telephone Encounter (Signed)
appts made and printed...td 

## 2012-09-06 NOTE — Progress Notes (Signed)
OFFICE PROGRESS NOTE  CC  [name], MD  [name], MD [place] [address]  DIAGNOSIS: 52 year old female with:  1.  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  PRIOR THERAPY:  #1 status post mastectomy with axillary lymph node dissection for a stage IIIa invasive lobular carcinoma measuring 6 x 8 cm with 6 positive lymph nodes.  #2 S/P 4 cycles of AC q 2 weeks with day 2 neulasta  #3 She is status post weekly Taxol starting on 01/31/2011 -  Completed 04/18/11. She received total of 12 weeks.  #4. S/p Radiation therapy to the left chest wall from 4/17 - 07/01/11  #5 patient began adjuvant antiestrogen therapy with letrozole 2.5 mg daily in June 2013. A total of 5 years of therapy is planned.  CURRENT THERAPY: Adjuvant letrozole 2.5 mg daily  INTERVAL HISTORY: Vanessa Mills 52 y.o. female returns for followup visit today. Clinically patient looks terrific. She is having hot flashes with the letrozole. Otherwise no other complaints she has no nausea vomiting fevers chills night sweats. Patient has gained significant amount of weight. She tells me that she is having significant problems with her scoliosis pain. Because of this she is not able to exercise much. We extensively discussed exercise possibly doing water aerobics. Remainder of the 10 point review of systems is negative. MEDICAL HISTORY: Past Medical History  Diagnosis Date  . Chronic mental illness   . Meniere's syndrome   . Crohn's disease   . Raynaud's disease   . GERD (gastroesophageal reflux disease)     does not take medications for    . Headache(784.0)     takes midodrine for migraines prn  . Breast cancer, ILC, Left, receptor+, Her2- 10/10/2010  . Mental disorder     bipolar, takes saphris at hs  . Chemotherapy follow-up examination 12/28/2010  . Fatigue 12/28/2010  . Breast cancer 03/07/2011  . Bipolar 1 disorder   . S/P radiation therapy 05/15/11 - 07/01/11    Left Breast: 4500 cGy/25 fractions with Boost to Left chest Wall/Mastectomy Scar for Toal dose of 5940 cGy  . Status post chemotherapy     4 cycles AC  . Status post chemotherapy 01/31/11 - 04/18/11    Taxol x 12 weeks  . PONV (postoperative nausea and vomiting)   . Movement disorder     ALLERGIES:  has No Known Allergies.  MEDICATIONS:  Current Outpatient Prescriptions  Medication Sig Dispense Refill  . ALPRAZolam (XANAX XR) 1 MG 24 hr tablet Take 2 tablets (2 mg total) by mouth 2 (two) times daily.  120 tablet  5  . asenapine (SAPHRIS) 5 MG SUBL Place 10 mg under the tongue at bedtime.       . Calcium Carbonate-Vitamin D (CALTRATE 600+D PO) Take 600 mg by mouth 2 (two) times daily.      . clonazePAM (KLONOPIN) 2 MG tablet Take 2 mg by mouth Daily.      [name] isometheptene-acetaminophen-dichloralphenazone (MIDRIN) 65-325-100 MG capsule as needed.       [name] letrozole (FEMARA) 2.5 MG tablet Take 2.5 mg by mouth daily.      . propranolol (INDERAL) 60 MG tablet Take  1 tablet (60 mg total) by mouth 2 (two) times daily.  60 tablet  11  . triamterene-hydrochlorothiazide (DYAZIDE) 37.5-25 MG per capsule Take 1 capsule by mouth every morning.        . diazepam (VALIUM) 10 MG tablet as needed.       . Multiple Vitamin (MULTIVITAMIN) capsule Take 1 capsule by mouth daily.        [name] zolpidem (AMBIEN) 10 MG tablet Take 1 tablet (10 mg total) by mouth at bedtime as needed for sleep.  30 tablet  0   No current facility-administered medications for this visit.    SURGICAL HISTORY:  Past Surgical History  Procedure Laterality Date  . Bladder tuck    . 2 orbital fracture  surgeries    . Sinus exploration    . Bladder suspension      done 2005  . Mastectomy modified radical  10/23/2010    Left Dr Margot Chimes  . Portacath placement  11/28/2010    via right subclavian - Dr Margot Chimes  . Abdominal hysterectomy      uterus removed only  . Breast surgery      Axillary dissection  . Port-a-cath removal  08/21/2011    Procedure: REMOVAL PORT-A-CATH;  Surgeon: Haywood Lasso, MD;  Location: [facility];  Service: General;  Laterality: Right;    REVIEW OF SYSTEMS:  A comprehensive review of systems was negative.   PHYSICAL EXAMINATION:  Awake alert NAD Heen: eomi perrla, sclera anicteric Lungs; clear CVS: RRR no murmurs ABD: soft NT/ND no HSM EXT: no C/C/E Neuro: nonfocal Right breast; no masses or nipple discharge, left mastectomy incision well healed ECOG PERFORMANCE STATUS: 1 - Symptomatic but completely ambulatory  Blood pressure 133/90, pulse 57, temperature 98.5 F (36.9 C), temperature source Oral, resp. rate 20, height 5' 10"  (1.778 m), weight 179 lb (81.194 kg).  LABORATORY DATA: Lab Results  Component Value Date   WBC 4.8 08/31/2012   HGB 14.1 08/31/2012   HCT 42.0 08/31/2012   MCV 92.4 08/31/2012   PLT 278 08/31/2012      Chemistry      Component Value Date/Time   NA 143 08/31/2012 1110   NA 141 09/18/2011 1305   K 3.8 08/31/2012 1110   K 3.8 09/18/2011 1305   CL 104 03/05/2012 1005   CL 103 09/18/2011 1305   CO2 30* 08/31/2012 1110   CO2 30 09/18/2011 1305   BUN 18.8 08/31/2012 1110   BUN 16 09/18/2011 1305   CREATININE 1.0 08/31/2012 1110   CREATININE 0.95 09/18/2011 1305      Component Value Date/Time   CALCIUM 10.3 08/31/2012 1110   CALCIUM 10.0 09/18/2011 1305   ALKPHOS 94 08/31/2012 1110   ALKPHOS 80 09/18/2011 1305   AST 19 08/31/2012 1110   AST 17 09/18/2011 1305   ALT 16 08/31/2012 1110   ALT 12 09/18/2011 1305   BILITOT 0.55 08/31/2012 1110   BILITOT 0.4 09/18/2011 1305       RADIOGRAPHIC STUDIES:  No results found.  ASSESSMENT: 52  year old female with:  1.  stage III breast cancer s/p mastectomy of the left breast which showed a 5 cm mass with 6 positive lymph nodes lobular features. Patient has no evidence of recurrent disease  2.  Patient is status post 4 cycles of dose dense Adriamycin and Cytoxan which she tolerated very well. She is status post 12 weeks of Taxol. Overall she has tolerated this quite well.  #3 patient  began antiestrogen therapy with letrozole in June 2013. Patient is tolerating it well.  PLAN:  #1 Doing well on letrozole  #2 Return in 6 months time     All questions were answered. The patient knows to call the clinic with any problems, questions or concerns. We can certainly see the patient much sooner if necessary.  I spent >25 minutes counseling and coordination of care. The total time spent in the appointment was 30 minutes.    Marcy Panning, MD Medical/Oncology [facility] [phone] (beeper) [phone] (Office)

## 2012-10-22 ENCOUNTER — Other Ambulatory Visit: Payer: Self-pay | Admitting: *Deleted

## 2012-10-22 DIAGNOSIS — C50919 Malignant neoplasm of unspecified site of unspecified female breast: Secondary | ICD-10-CM

## 2012-10-22 MED ORDER — LETROZOLE 2.5 MG PO TABS: 2.5000 mg | ORAL_TABLET | Freq: Every day | ORAL | Status: DC

## 2012-11-10 ENCOUNTER — Telehealth: Payer: Self-pay | Admitting: *Deleted

## 2012-11-10 NOTE — Telephone Encounter (Signed)
PER MD, notified pt this may be scar tissue, she need to continue to monitor and speak with Dr. Margot Chimes about this concern at her appt on 10/21. If worsens to go to ER. Pt verbalized understanding. No further concerns.

## 2012-11-10 NOTE — Telephone Encounter (Signed)
Pt called states " I've been having some pain where my breast was removed, like it's in my bones for a few weeks. It's a stabbing type pain, lasting about 59mns comes and goes about every few days." Pt denies pain with activity happens when sitting most of the time, no fevers, no shortness of breath. Pain resolves on it's own without medication. Will review with MD. NCydney Okf/u 03/04/12 Pt has appt with Dr. SClement Sayres10/21.

## 2012-11-10 NOTE — Telephone Encounter (Signed)
Let patient know that this may just be scar tissue but she should continue to monitor this and speak with Dr Margot Chimes at her appointment.  If it worsens she should go to the ER

## 2012-11-16 ENCOUNTER — Other Ambulatory Visit: Payer: Self-pay | Admitting: Neurology

## 2012-11-17 ENCOUNTER — Encounter (INDEPENDENT_AMBULATORY_CARE_PROVIDER_SITE_OTHER): Payer: Self-pay | Admitting: Surgery

## 2012-11-17 ENCOUNTER — Ambulatory Visit (INDEPENDENT_AMBULATORY_CARE_PROVIDER_SITE_OTHER): Payer: Medicare Other | Admitting: Surgery

## 2012-11-17 VITALS — BP 128/72 | HR 60 | Resp 18 | Ht 68.0 in | Wt 184.0 lb

## 2012-11-17 DIAGNOSIS — Z853 Personal history of malignant neoplasm of breast: Secondary | ICD-10-CM

## 2012-11-17 MED ORDER — ALPRAZOLAM ER 1 MG PO TB24: 2.0000 mg | ORAL_TABLET | Freq: Two times a day (BID) | ORAL | Status: DC

## 2012-11-17 NOTE — Progress Notes (Signed)
NAME: Vanessa Mills       DOB: [birth year]           DATE: 11/17/2012       MRN: [id number]   Vanessa Mills is a 52 y.o.Marland Kitchenfemale who presents for routine followup of her Left breast cancer, ILC, Stage IIIA,  diagnosed in 2012 and treated with mastectomy, chemo and  Radiation, and now anti-estrogen. She has no problems or concerns on either side. She is thinking about a prophylactic mastectomy  PFSH: She has had no significant changes since the last visit here.  ROS: There have been no significant changes since the last visit here  EXAM:  VS: BP 128/72  Pulse 60  Resp 18  Ht 5' 8"  (1.727 m)  Wt 184 lb (83.462 kg)  BMI 27.98 kg/m2   General: The patient is alert, oriented, generally healthy appearing, NAD. Mood and affect are normal.  Breasts:  Right is negative. Left s/p mastectomy with minimal post radiation changes  Lymphatics: She has no axillary or supraclavicular adenopathy on either side.  Extremities: Full ROM of the surgical side with no lymphedema noted.  Data Reviewed: Mammogram report from 12/13 was negative.  Impression: Doing well, with no evidence of recurrent cancer or new cancer  Plan: Will continue to follow up on an annual basis here.

## 2012-11-17 NOTE — Patient Instructions (Signed)
Continue annual mammograms and follow ups

## 2012-11-17 NOTE — Telephone Encounter (Signed)
Rx signed and faxed.

## 2012-11-20 ENCOUNTER — Encounter: Payer: Self-pay | Admitting: *Deleted

## 2012-11-20 NOTE — Progress Notes (Signed)
RECEIVED A FAX FROM [employer] CONCERNING A PRIOR AUTHORIZATION FOR ZOLPIDEM. THIS REQUEST WAS GIVEN TO MANAGED [facility]ARE.

## 2012-11-23 ENCOUNTER — Encounter: Payer: Self-pay | Admitting: Oncology

## 2012-11-23 ENCOUNTER — Other Ambulatory Visit: Payer: Self-pay | Admitting: Nurse Practitioner

## 2012-11-23 ENCOUNTER — Other Ambulatory Visit: Payer: Self-pay | Admitting: Emergency Medicine

## 2012-11-23 MED ORDER — ZOLPIDEM TARTRATE 10 MG PO TABS: 10.0000 mg | ORAL_TABLET | Freq: Every evening | ORAL | Status: AC | PRN

## 2012-11-23 NOTE — Progress Notes (Signed)
[facility], [id number], approved zolpidem tartrate 95m from 11/02/12-11/23/13.

## 2012-12-03 ENCOUNTER — Other Ambulatory Visit: Payer: Self-pay

## 2013-02-10 ENCOUNTER — Telehealth: Payer: Self-pay | Admitting: Neurology

## 2013-02-10 NOTE — Telephone Encounter (Signed)
Patient calling to state that she would like her Propranolol dosage lowered to 40 mg instead of 60 mg since she said her doctor said that her heart rate was too low and was probably being caused by the medication. Please advise.

## 2013-02-11 MED ORDER — PROPRANOLOL HCL 40 MG PO TABS
40.0000 mg | ORAL_TABLET | Freq: Two times a day (BID) | ORAL | Status: DC
Start: 2013-02-11 — End: 2013-06-10

## 2013-02-11 NOTE — Telephone Encounter (Signed)
TC to patient. Left message will decrease Inderal to 93m BID. Called to local pharmacy

## 2013-03-04 ENCOUNTER — Encounter: Payer: Self-pay | Admitting: Oncology

## 2013-03-04 ENCOUNTER — Other Ambulatory Visit (HOSPITAL_BASED_OUTPATIENT_CLINIC_OR_DEPARTMENT_OTHER): Payer: Medicare Other

## 2013-03-04 ENCOUNTER — Ambulatory Visit: Payer: Medicare Other

## 2013-03-04 ENCOUNTER — Telehealth: Payer: Self-pay | Admitting: Oncology

## 2013-03-04 ENCOUNTER — Ambulatory Visit (HOSPITAL_BASED_OUTPATIENT_CLINIC_OR_DEPARTMENT_OTHER): Payer: Medicare Other | Admitting: Oncology

## 2013-03-04 VITALS — BP 138/89 | HR 55 | Temp 99.0°F | Resp 20 | Ht 70.0 in | Wt 189.8 lb

## 2013-03-04 DIAGNOSIS — N289 Disorder of kidney and ureter, unspecified: Secondary | ICD-10-CM

## 2013-03-04 DIAGNOSIS — C50419 Malignant neoplasm of upper-outer quadrant of unspecified female breast: Secondary | ICD-10-CM

## 2013-03-04 DIAGNOSIS — Z170 Estrogen receptor positive status [ER+]: Secondary | ICD-10-CM

## 2013-03-04 DIAGNOSIS — C773 Secondary and unspecified malignant neoplasm of axilla and upper limb lymph nodes: Secondary | ICD-10-CM

## 2013-03-04 DIAGNOSIS — E559 Vitamin D deficiency, unspecified: Secondary | ICD-10-CM

## 2013-03-04 DIAGNOSIS — E785 Hyperlipidemia, unspecified: Secondary | ICD-10-CM

## 2013-03-04 DIAGNOSIS — C50912 Malignant neoplasm of unspecified site of left female breast: Secondary | ICD-10-CM

## 2013-03-04 DIAGNOSIS — C50919 Malignant neoplasm of unspecified site of unspecified female breast: Secondary | ICD-10-CM

## 2013-03-04 LAB — LIPID PANEL
CHOL/HDL RATIO: 3.3 ratio
CHOLESTEROL: 214 mg/dL — AB (ref 0–200)
HDL: 65 mg/dL (ref >39)
LDL Cholesterol: 111 mg/dL — ABNORMAL HIGH (ref 0–99)
TRIGLYCERIDES: 189 mg/dL — AB (ref <150)
VLDL: 38 mg/dL (ref 0–40)

## 2013-03-04 LAB — COMPREHENSIVE METABOLIC PANEL (CC13)
ALK PHOS: 93 U/L (ref 40–150)
ALT: 17 U/L (ref 0–55)
AST: 17 U/L (ref 5–34)
Albumin: 4.2 g/dL (ref 3.5–5.0)
Anion Gap: 10 mEq/L (ref 3–11)
BUN: 15.9 mg/dL (ref 7.0–26.0)
CALCIUM: 9.8 mg/dL (ref 8.4–10.4)
CO2: 25 mEq/L (ref 22–29)
CREATININE: 0.8 mg/dL (ref 0.6–1.1)
Chloride: 106 mEq/L (ref 98–109)
Glucose: 88 mg/dl (ref 70–140)
Potassium: 3.9 mEq/L (ref 3.5–5.1)
Sodium: 141 mEq/L (ref 136–145)
Total Bilirubin: 0.59 mg/dL (ref 0.20–1.20)
Total Protein: 6.8 g/dL (ref 6.4–8.3)

## 2013-03-04 LAB — CBC WITH DIFFERENTIAL/PLATELET
BASO%: 0.6 % (ref 0.0–2.0)
BASOS ABS: 0 10*3/uL (ref 0.0–0.1)
EOS%: 1.6 % (ref 0.0–7.0)
Eosinophils Absolute: 0.1 10*3/uL (ref 0.0–0.5)
HEMATOCRIT: 41.5 % (ref 34.8–46.6)
HEMOGLOBIN: 13.8 g/dL (ref 11.6–15.9)
LYMPH%: 26 % (ref 14.0–49.7)
MCH: 31.1 pg (ref 25.1–34.0)
MCHC: 33.4 g/dL (ref 31.5–36.0)
MCV: 93.1 fL (ref 79.5–101.0)
MONO#: 0.4 10*3/uL (ref 0.1–0.9)
MONO%: 7.7 % (ref 0.0–14.0)
NEUT#: 3.3 10*3/uL (ref 1.5–6.5)
NEUT%: 64.1 % (ref 38.4–76.8)
Platelets: 269 10*3/uL (ref 145–400)
RBC: 4.46 10*6/uL (ref 3.70–5.45)
RDW: 13 % (ref 11.2–14.5)
WBC: 5.2 10*3/uL (ref 3.9–10.3)
lymph#: 1.3 10*3/uL (ref 0.9–3.3)

## 2013-03-04 MED ORDER — LETROZOLE 2.5 MG PO TABS: 2.5000 mg | ORAL_TABLET | Freq: Every day | ORAL | Status: DC

## 2013-03-04 NOTE — Telephone Encounter (Signed)
, °

## 2013-03-04 NOTE — Progress Notes (Signed)
OFFICE PROGRESS NOTE  CC  [name], MD  [name], MD [place] [address]  DIAGNOSIS: 53 year old female with:  1.  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  PRIOR THERAPY:  #1 status post mastectomy with axillary lymph node dissection for a stage IIIa invasive lobular carcinoma measuring 6 x 8 cm with 6 positive lymph nodes.  #2 S/P 4 cycles of AC q 2 weeks with day 2 neulasta  #3  status post weekly Taxol starting on 01/31/2011 -  Completed 04/18/11. She received total of 12 weeks.  #4. S/p Radiation therapy to the left chest wall from 4/17 - 07/01/11  #5 patient began adjuvant antiestrogen therapy with letrozole 2.5 mg daily in June 2013. A total of 5 years of therapy is planned.  CURRENT THERAPY: Adjuvant letrozole 2.5 mg daily  INTERVAL HISTORY: Vanessa Mills 53 y.o. female returns for followup visit today. Clinically patient looks terrific. She is having hot flashes with the letrozole. Otherwise no other complaints she has no nausea vomiting fevers chills night sweats. Patient has gained significant amount of weight. She tells me that she is having significant problems with her scoliosis pain. Because of this she is not able to exercise much. We extensively discussed exercise possibly doing water aerobics. Patient tells me that she has developed kidney disease stage III. She is being seen by a nephrologist in [facility]. She has been taken off of calcium as well as vitamin D. Remainder of the 10 point review of systems is negative. MEDICAL HISTORY: Past Medical History  Diagnosis Date  .  Chronic mental illness   . Meniere's syndrome   . Crohn's disease   . Raynaud's disease   . GERD (gastroesophageal reflux disease)     does not take medications for   . Headache(784.0)     takes midodrine for migraines prn  . Breast cancer, ILC, Left, receptor+, Her2- 10/10/2010  . Mental disorder     bipolar, takes saphris at hs  . Chemotherapy follow-up examination 12/28/2010  . Fatigue 12/28/2010  . Breast cancer 03/07/2011  . Bipolar 1 disorder   . S/P radiation therapy 05/15/11 - 07/01/11    Left Breast: 4500 cGy/25 fractions with Boost to Left chest Wall/Mastectomy Scar for Toal dose of 5940 cGy  . Status post chemotherapy     4 cycles AC  . Status post chemotherapy 01/31/11 - 04/18/11    Taxol x 12 weeks  . PONV (postoperative nausea and vomiting)   . Movement disorder     ALLERGIES:  has No Known Allergies.  MEDICATIONS:  Current Outpatient Prescriptions  Medication Sig Dispense Refill  . ALPRAZolam (XANAX XR) 1 MG 24 hr tablet Take 2 tablets (2 mg total) by mouth 2 (two) times daily.  120 tablet  5  . asenapine (SAPHRIS) 5 MG SUBL Place 10 mg under the tongue at bedtime.       . clonazePAM (KLONOPIN) 2 MG tablet Take 2 mg by mouth Daily.      . diazepam (VALIUM) 10 MG tablet as needed.       [name] letrozole (FEMARA) 2.5 MG tablet Take 1 tablet (2.5 mg total) by  mouth daily.  90 tablet  1  . propranolol (INDERAL) 40 MG tablet Take 1 tablet (40 mg total) by mouth 2 (two) times daily.  60 tablet  6  . Calcium Carbonate-Vitamin D (CALTRATE 600+D PO) Take 600 mg by mouth 2 (two) times daily.      [name] isometheptene-acetaminophen-dichloralphenazone (MIDRIN) 65-325-100 MG capsule as needed.       . Multiple Vitamin (MULTIVITAMIN) capsule Take 1 capsule by mouth daily.        [name] triamterene-hydrochlorothiazide (DYAZIDE) 37.5-25 MG per capsule Take 1 capsule by mouth every morning.         No current facility-administered medications for this visit.    SURGICAL HISTORY:  Past Surgical  History  Procedure Laterality Date  . Bladder tuck    . 2 orbital fracture surgeries    . Sinus exploration    . Bladder suspension      done 2005  . Mastectomy modified radical  10/23/2010    Left Dr Margot Chimes  . Portacath placement  11/28/2010    via right subclavian - Dr Margot Chimes  . Abdominal hysterectomy      uterus removed only  . Breast surgery      Axillary dissection  . Port-a-cath removal  08/21/2011    Procedure: REMOVAL PORT-A-CATH;  Surgeon: Haywood Lasso, MD;  Location: [facility];  Service: General;  Laterality: Right;    REVIEW OF SYSTEMS:  A comprehensive review of systems was negative.   PHYSICAL EXAMINATION:  Awake alert NAD Heen: eomi perrla, sclera anicteric Lungs; clear CVS: RRR no murmurs ABD: soft NT/ND no HSM EXT: no C/C/E Neuro: nonfocal Right breast; no masses or nipple discharge, left mastectomy incision well healed ECOG PERFORMANCE STATUS: 1 - Symptomatic but completely ambulatory  Blood pressure 138/89, pulse 55, temperature 99 F (37.2 C), temperature source Oral, resp. rate 20, height _0  (1.778 m), weight 189 lb 12.8 oz (86.093 kg).  LABORATORY DATA: Lab Results  Component Value Date   WBC 5.2 03/04/2013   HGB 13.8 03/04/2013   HCT 41.5 03/04/2013   MCV 93.1 03/04/2013   PLT 269 03/04/2013      Chemistry      Component Value Date/Time   NA 143 08/31/2012 1110   NA 141 09/18/2011 1305   K 3.8 08/31/2012 1110   K 3.8 09/18/2011 1305   CL 104 03/05/2012 1005   CL 103 09/18/2011 1305   CO2 30* 08/31/2012 1110   CO2 30 09/18/2011 1305   BUN 18.8 08/31/2012 1110   BUN 16 09/18/2011 1305   CREATININE 1.0 08/31/2012 1110   CREATININE 0.95 09/18/2011 1305      Component Value Date/Time   CALCIUM 10.3 08/31/2012 1110   CALCIUM 10.0 09/18/2011 1305   ALKPHOS 94 08/31/2012 1110   ALKPHOS 80 09/18/2011 1305   AST 19 08/31/2012 1110   AST 17 09/18/2011 1305   ALT 16 08/31/2012 1110   ALT 12 09/18/2011 1305   BILITOT 0.55 08/31/2012 1110   BILITOT 0.4  09/18/2011 1305       RADIOGRAPHIC STUDIES:  No results found.  ASSESSMENT/PLAN: 53 year old female with:  #1.  stage III breast cancer s/p mastectomy of the left breast which showed a 5 cm mass with 6 positive lymph nodes lobular features. Patient has no evidence of recurrent disease  #2.  Patient is status post 4 cycles of dose dense Adriamycin and Cytoxan which she tolerated very well. She is status post 12 weeks  of Taxol. Overall she has tolerated this quite well.  #3 patient began antiestrogen therapy with letrozole in June 2013. Patient is tolerating it well. Total of 5 years of therapy is planned. A prescription was sent to her pharmacy.  #4 kidney disease: Followed by nephrology  #5 patient will be seen back in 6 months time for followup. We will plan on doing CBC CMET. Today she will have a lipid panel performed as well   All questions were answered. The patient knows to call the clinic with any problems, questions or concerns. We can certainly see the patient much sooner if necessary.  I spent 20 minutes counseling and coordination of care. The total time spent in the appointment was 30 minutes.    Marcy Panning, MD Medical/Oncology [facility] [phone] (beeper) [phone] (Office)

## 2013-03-05 ENCOUNTER — Telehealth: Payer: Self-pay | Admitting: *Deleted

## 2013-03-05 LAB — VITAMIN D 25 HYDROXY (VIT D DEFICIENCY, FRACTURES): Vit D, 25-Hydroxy: 74 ng/mL (ref 30–89)

## 2013-03-05 NOTE — Telephone Encounter (Signed)
Message copied by Hebert Soho on Fri Mar 05, 2013  5:45 PM ------      Message from: Deatra Robinson      Created: Fri Mar 05, 2013 12:40 PM       Call pt: vitamin D level normal            Lipid panel with high cholesterol please speak to your PCP regarding this ------

## 2013-03-05 NOTE — Telephone Encounter (Signed)
As noted below by Dr. Humphrey Rolls, I informed patient that her Vitamin D level was normal, but the lipid panel and high cholesterol was noted. Patient verbalized understanding and stated, "I have an appointment with my PCP next week."

## 2013-05-12 ENCOUNTER — Other Ambulatory Visit: Payer: Self-pay | Admitting: Neurology

## 2013-05-17 ENCOUNTER — Telehealth: Payer: Self-pay | Admitting: Nurse Practitioner

## 2013-05-17 NOTE — Telephone Encounter (Signed)
Patient calling to request Xanax refill, states she is completely out.

## 2013-05-17 NOTE — Telephone Encounter (Signed)
Request was already sent to the provider, pending signature.  Rx will be faxed once it has been signed.

## 2013-06-10 ENCOUNTER — Ambulatory Visit (INDEPENDENT_AMBULATORY_CARE_PROVIDER_SITE_OTHER): Payer: Medicare Other | Admitting: Nurse Practitioner

## 2013-06-10 ENCOUNTER — Encounter: Payer: Self-pay | Admitting: Nurse Practitioner

## 2013-06-10 VITALS — BP 119/81 | HR 51 | Ht 68.0 in | Wt 192.0 lb

## 2013-06-10 DIAGNOSIS — R259 Unspecified abnormal involuntary movements: Secondary | ICD-10-CM

## 2013-06-10 DIAGNOSIS — G252 Other specified forms of tremor: Secondary | ICD-10-CM

## 2013-06-10 DIAGNOSIS — G243 Spasmodic torticollis: Secondary | ICD-10-CM

## 2013-06-10 DIAGNOSIS — G250 Essential tremor: Secondary | ICD-10-CM

## 2013-06-10 MED ORDER — PROPRANOLOL HCL 40 MG PO TABS: 40.0000 mg | ORAL_TABLET | Freq: Two times a day (BID) | ORAL | Status: DC

## 2013-06-10 NOTE — Patient Instructions (Signed)
Continue propranolol 40 mg twice daily Will renew. Continue Xanax twice daily does not need refills Followup yearly and when necessary

## 2013-06-10 NOTE — Progress Notes (Signed)
[facility]  PATIENT: Vanessa Mills DOB: 08-Dec-1960   REASON FOR VISIT: Followup for essential tremor and cervical dystonia   HISTORY OF PRESENT ILLNESS: Ms. Mills, 53 year old female returns for followup. 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 Her hand tremor and head titubation first occurred in 2004, originally seen by Dr. Doy Mince and treated with propanolol which has been very beneficial. She also takes Xanax. She also received Botox for her cervical dystonia and head titubation since 2006 however she stopped the Botox in 2012 because she felt it was no longer beneficial. She also has a history of breast cancer.Bipolar disorder treated by psych.      REVIEW OF SYSTEMS: Full 14 system review of systems performed and notable only for those listed, all others are neg:  Constitutional: N/A  Cardiovascular: N/A  Ear/Nose/Throat: N/A  Skin: N/A  Eyes: N/A  Respiratory: N/A  Gastroitestinal: Diarrhea Hematology/Lymphatic: N/A  Endocrine: N/A Musculoskeletal:N/A  Allergy/Immunology: N/A  Neurological: N/A Psychiatric: N/A Sleep : NA   ALLERGIES: No Known Allergies  HOME MEDICATIONS: Outpatient Prescriptions Prior to Visit  Medication Sig Dispense Refill  . ALPRAZolam (XANAX XR) 1 MG 24 hr tablet TAKE 2 TABLETS BY MOUTH TWICE DAILY  120 tablet  5  . asenapine (SAPHRIS) 5 MG SUBL Place 10 mg under the tongue at bedtime.       . clonazePAM (KLONOPIN) 2 MG tablet Take 2 mg by mouth Daily.      . diazepam (VALIUM) 10 MG tablet as needed.       . isometheptene-acetaminophen-dichloralphenazone (MIDRIN) 65-325-100 MG capsule as needed.       [name] letrozole (FEMARA) 2.5 MG tablet Take 1 tablet (2.5 mg total) by mouth daily.  90 tablet  6  . propranolol (INDERAL) 40 MG tablet Take 1 tablet (40 mg total) by mouth 2 (two) times daily.  60 tablet  6  . Calcium Carbonate-Vitamin D (CALTRATE 600+D PO) Take 600 mg by mouth 2 (two)  times daily.      . Multiple Vitamin (MULTIVITAMIN) capsule Take 1 capsule by mouth daily.        [name] triamterene-hydrochlorothiazide (DYAZIDE) 37.5-25 MG per capsule Take 1 capsule by mouth every morning.         No facility-administered medications prior to visit.    PAST MEDICAL HISTORY: Past Medical History  Diagnosis Date  . Chronic mental illness   . Meniere's syndrome   . Crohn's disease   . Raynaud's disease   . GERD (gastroesophageal reflux disease)     does not take medications for   . Headache(784.0)     takes midodrine for migraines prn  . Breast cancer, ILC, Left, receptor+, Her2- 10/10/2010  . Mental disorder     bipolar, takes saphris at hs  . Chemotherapy follow-up examination 12/28/2010  . Fatigue 12/28/2010  . Breast cancer 03/07/2011  . Bipolar 1 disorder   . S/P radiation therapy 05/15/11 - 07/01/11    Left Breast: 4500 cGy/25 fractions with Boost to Left chest Wall/Mastectomy Scar for Toal dose of 5940 cGy  . Status post chemotherapy     4 cycles AC  . Status post chemotherapy 01/31/11 - 04/18/11    Taxol x 12 weeks  . PONV (postoperative nausea and vomiting)   . Movement disorder   Stage 3 kidney disease  PAST SURGICAL HISTORY: Past Surgical History  Procedure Laterality Date  . Bladder tuck    . 2 orbital fracture  surgeries    . Sinus exploration    . Bladder suspension      done 2005  . Mastectomy modified radical  10/23/2010    Left Dr Margot Chimes  . Portacath placement  11/28/2010    via right subclavian - Dr Margot Chimes  . Abdominal hysterectomy      uterus removed only  . Breast surgery      Axillary dissection  . Port-a-cath removal  08/21/2011    Procedure: REMOVAL PORT-A-CATH;  Surgeon: Haywood Lasso, MD;  Location: [facility];  Service: General;  Laterality: Right;    FAMILY HISTORY: Family History  Problem Relation Age of Onset  . Cancer Maternal Aunt     breast, thyroid, colonm    SOCIAL HISTORY: History   Social History    . Marital Status: Married    Spouse Name: Linna Hoff     Number of Children: 1  . Years of Education: Masters   Occupational History  . Not on file.   Social History Main Topics  . Smoking status: Never Smoker   . Smokeless tobacco: Never Used  . Alcohol Use: 1.7 oz/week    2 Glasses of wine, 1 Drinks containing 0.5 oz of alcohol per week  . Drug Use: No     Comment: in college  . Sexual Activity: Yes     Comment: erpr+/HER-2 neg.   Other Topics Concern  . Not on file   Social History Narrative   Married to Adrian   One dtr- 55 years old- [name]- in [place]   Patient has a Masters   Patient  has 1 child.      PHYSICAL EXAM  Filed Vitals:   06/10/13 1414  BP: 119/81  Pulse: 51  Height: 5' 8"  (1.727 m)  Weight: 192 lb (87.091 kg)   Body mass index is 29.2 kg/(m^2).  Generalized: Well developed, in no acute distress  Head: normocephalic and atraumatic,. Oropharynx benign , mild tremor of the head Neck: Supple, no carotid bruits  Cardiac: Regular rate rhythm, no murmur  Musculoskeletal: No deformity   Neurological examination   Mentation: Alert oriented to time, place, history taking. Follows all commands speech and language fluent  Cranial nerve II-XII: Pupils were equal round reactive to light extraocular movements were full, visual field were full on confrontational test. Facial sensation and strength were normal. hearing was intact to finger rubbing bilaterally. Uvula tongue midline. head turning and shoulder shrug were normal and symmetric.Tongue protrusion into cheek strength was normal. Motor: normal bulk and tone, full strength in the BUE, BLE, fine finger movements normal, no pronator drift. No focal weakness Sensory: normal and symmetric to light touch, pinprick, and  vibration  Coordination: finger-nose-finger, heel-to-shin bilaterally, no dysmetria Reflexes: 1+ upper lower and symetric,  plantar responses were neutral Gait and Station: Rising up from  seated position without assistance, normal stance,  moderate stride, good arm swing, smooth turning, able to perform tiptoe, and heel walking without difficulty. Tandem gait is steady  DIAGNOSTIC DATA (LABS, IMAGING, TESTING) - I reviewed patient records, labs, notes, testing and imaging myself where available.  Lab Results  Component Value Date   WBC 5.2 03/04/2013   HGB 13.8 03/04/2013   HCT 41.5 03/04/2013   MCV 93.1 03/04/2013   PLT 269 03/04/2013      Component Value Date/Time   NA 141 03/04/2013 1017   NA 141 09/18/2011 1305   K 3.9 03/04/2013 1017   K 3.8 09/18/2011 1305  CL 104 03/05/2012 1005   CL 103 09/18/2011 1305   CO2 25 03/04/2013 1017   CO2 30 09/18/2011 1305   GLUCOSE 88 03/04/2013 1017   GLUCOSE 93 03/05/2012 1005   GLUCOSE 71 09/18/2011 1305   BUN 15.9 03/04/2013 1017   BUN 16 09/18/2011 1305   CREATININE 0.8 03/04/2013 1017   CREATININE 0.95 09/18/2011 1305   CALCIUM 9.8 03/04/2013 1017   CALCIUM 10.0 09/18/2011 1305   PROT 6.8 03/04/2013 1017   PROT 7.0 09/18/2011 1305   ALBUMIN 4.2 03/04/2013 1017   ALBUMIN 4.3 09/18/2011 1305   AST 17 03/04/2013 1017   AST 17 09/18/2011 1305   ALT 17 03/04/2013 1017   ALT 12 09/18/2011 1305   ALKPHOS 93 03/04/2013 1017   ALKPHOS 80 09/18/2011 1305   BILITOT 0.59 03/04/2013 1017   BILITOT 0.4 09/18/2011 1305   GFRNONAA >90 11/26/2010 1107   GFRAA >90 11/26/2010 1107   Lab Results  Component Value Date   CHOL 214* 03/04/2013   HDL 65 03/04/2013   LDLCALC 111* 03/04/2013   TRIG 189* 03/04/2013   CHOLHDL 3.3 03/04/2013       ASSESSMENT AND PLAN  53 y.o. year old female  has a past medical history of Bipolar 1 disorder; Movement disorder, essential tremor and history of cervical dystonia. Patient has refused further Botox treatments  Continue propranolol 40 mg twice daily Will renew. Continue Xanax twice daily does not need refills Followup yearly and when necessary Dennie Bible, [facility] APRN  [facility] [address], [place], [place] [address] [phone]

## 2013-06-15 ENCOUNTER — Telehealth: Payer: Self-pay

## 2013-06-15 NOTE — Telephone Encounter (Signed)
Rcvd by fax progress notes from [facility] dtd 06/04/13 Dr. Watt Climes

## 2013-06-28 ENCOUNTER — Other Ambulatory Visit: Payer: Self-pay | Admitting: Gastroenterology

## 2013-07-16 ENCOUNTER — Telehealth: Payer: Self-pay | Admitting: Neurology

## 2013-07-16 ENCOUNTER — Other Ambulatory Visit: Payer: Self-pay | Admitting: Neurology

## 2013-07-16 NOTE — Telephone Encounter (Signed)
We have been prescribing Xanax 46m BID for the patient.  Apparently Dr KToy Careis prescribing Klonopin 224mdaily at bedtime and Valium 1063mID.  [facility] Dr YanKrista Bluend would like to know if she still wants them to continue filling Xanax.  Please advise.  Thank you.

## 2013-07-16 NOTE — Telephone Encounter (Signed)
I called the pharmacy back.  Spoke with Lauren who transferred me to the pharmacist, Jeris Penta.  She will cancel the Xanax Rx and notate the patients chart.

## 2013-07-16 NOTE — Telephone Encounter (Signed)
Pharmacist from [facility] called states pt see's Dr. Chucky May and wants to find out if Dr. Krista Blue knows pt is taking all these medications and does she approve for pt taking all 3 on a daily base.  1. ALPRAZolam (XANAX XR) 1 MG 24 hr tablet 1 mg 2 tablets twice a day 2. clonazePAM (KLONOPIN) 2 MG tablet 2 mg 1 @bedtime  3. diazepam (VALIUM) 10 MG tablet 10 mg #60  twice a day  Please call pharmacist at [facility] and advise on whether Dr. Krista Blue agrees if pt should be on all 3 medications every day. Thanks

## 2013-07-16 NOTE — Telephone Encounter (Signed)
Vanessa Mills, no we will not do xanax 41m bid anymore

## 2013-07-19 ENCOUNTER — Telehealth: Payer: Self-pay | Admitting: Neurology

## 2013-07-19 NOTE — Telephone Encounter (Signed)
We will no longer fill this med per Dr Krista Blue.  Please see previous note.

## 2013-07-19 NOTE — Telephone Encounter (Signed)
I called the pharmacy.  Spoke with Jeris Penta (pharmacist).  She said the patient picked up a Rx for Alprazolam today at 9:23am from Dr Toy Care.  She said Judson Roch called it in and asked them to cancel Diazepam and Clonazepam Rx's.  I called the patient back.  She said Dr Toy Care is only authorizing the Xanax one time, and will not provide further refills.  She will no longer be getting the Valium or Klonopin from that office either.  She will contact her PCP to see if they would like to provide future refills on meds, and will call us back if needed.

## 2013-08-09 ENCOUNTER — Telehealth: Payer: Self-pay | Admitting: Neurology

## 2013-08-09 NOTE — Telephone Encounter (Signed)
Called pt to inform her of the phone note on 07/16/13 and pt stated that she still needs something for her cervical dystonia to help with the shaking of her neck and that Dr. Toy Care stopped the klonopin and valium. Please advise

## 2013-08-09 NOTE — Telephone Encounter (Signed)
Patient called to discuss why her medications for cervical dystonia have been cancelled. Patient states she needs something to help with the shaking of her neck. Please return call to patient and advise.

## 2013-08-09 NOTE — Telephone Encounter (Signed)
Please give her a follow up appt to address her medication questions.

## 2013-08-10 NOTE — Telephone Encounter (Signed)
Dr.Yan had a cx patient will come in Thursday and see Dr.Yan to talk to Dr.Yan about her XB'O

## 2013-08-12 ENCOUNTER — Ambulatory Visit (INDEPENDENT_AMBULATORY_CARE_PROVIDER_SITE_OTHER): Payer: Medicare Other | Admitting: Neurology

## 2013-08-12 ENCOUNTER — Encounter: Payer: Self-pay | Admitting: Neurology

## 2013-08-12 VITALS — BP 148/92 | HR 56 | Ht 68.0 in | Wt 198.0 lb

## 2013-08-12 DIAGNOSIS — G252 Other specified forms of tremor: Secondary | ICD-10-CM

## 2013-08-12 DIAGNOSIS — G250 Essential tremor: Secondary | ICD-10-CM

## 2013-08-12 DIAGNOSIS — F309 Manic episode, unspecified: Secondary | ICD-10-CM

## 2013-08-12 DIAGNOSIS — F3110 Bipolar disorder, current episode manic without psychotic features, unspecified: Secondary | ICD-10-CM

## 2013-08-12 MED ORDER — ALPRAZOLAM ER 1 MG PO TB24: ORAL_TABLET | ORAL | Status: DC

## 2013-08-12 MED ORDER — PROPRANOLOL HCL 40 MG PO TABS: 40.0000 mg | ORAL_TABLET | Freq: Two times a day (BID) | ORAL | Status: DC

## 2013-08-12 NOTE — Progress Notes (Addendum)
[facility]  PATIENT: Vanessa Mills DOB: 1960-10-27  HISTORY OF PRESENT ILLNESS: Ms. Vanessa Mills, 53 year old female returns for followup for essential tremor, cervical dystonia.  Her hand tremor and head titubation first occurred in 2004, originally seen by Dr. Doy Mince and treated with propanolol which has been very beneficial.   She also takes Xanax. She also received Botox for her cervical dystonia and head titubation since 2006,  however she stopped the Botox in 2012, because she felt it was no longer beneficial. She also has a history of breast cancer, had left lobectomy in Sep 2012, followed by chemo/radiation therapy, bipolar disorder since 2008,  treated by psychiatrist Dr. Toy Mills, is taking Saphris 372m 2 tabs qday, it was changed from resperidone in the the past, which was not effective.   There was some questions about her prescription, this [facility] "We have been prescribing Xanax 213mBID for the patient.  Apparently Dr KaToy Cares prescribing Klonopin 72m472maily at bedtime and Valium 3m65mD.  [facility] to Dr Maylin [place],Krista Blued would like to know if she still wants them to continue filling Xanax.  Please advise."  I have cancelled her xanax prescription then, she came in urgently, wanted to have her Xanax refilled  I have reviewed her Xanax refill history of from pharmacy from 2014, she was getting Xanax ER 1 mg 120 tablets each month, I also contact her psychologist Dr. KaurToy Mills, relayed information, the patient stated that Xanax was very helpful for her head tremor, "definitely needed it per patient".  Our office can continue to refill her xanax er 1mg 89mbs po bid, and propranolol 40mg 19m   REVIEW OF SYSTEMS: Full 14 system review of systems performed and notable only for those listed, all others are neg:  Head shaking, mood disorder.   ALLERGIES: No Known  Allergies  HOME MEDICATIONS: Outpatient Prescriptions Prior to Visit  Medication Sig Dispense Refill  . ALPRAZolam (XANAX XR) 1 MG 24 hr tablet TAKE 2 TABLETS BY MOUTH TWICE DAILY  120 tablet  5  . asenapine (SAPHRIS) 5 MG SUBL Place 10 mg under the tongue at bedtime.       . isomMarland Kitchentheptene-acetaminophen-dichloralphenazone (MIDRIN) 65-325-100 MG capsule as needed.       . letrMarland Kitchenzole (FEMARA) 2.5 MG tablet Take 1 tablet (2.5 mg total) by mouth daily.  90 tablet  6  . propranolol (INDERAL) 40 MG tablet Take 1 tablet (40 mg total) by mouth 2 (two) times daily.  60 tablet  11  . amLODipine (NORVASC) 10 MG tablet Take 10 mg by mouth daily.      . clonazePAM (KLONOPIN) 2 MG tablet Take 2 mg by mouth Daily.      . diazepam (VALIUM) 10 MG tablet as needed.        No facility-administered medications prior to visit.    PAST MEDICAL HISTORY: Past Medical History  Diagnosis Date  . Chronic mental illness   . Meniere's syndrome   . Crohn's disease   . Raynaud's disease   . GERD (gastroesophageal reflux disease)     does not take medications for   . Headache(784.0)     takes midodrine for migraines prn  . Breast cancer, ILC, Left, receptor+, Her2- 10/10/2010  . Mental disorder     bipolar, takes saphris at hs  . Chemotherapy follow-up examination 12/28/2010  . Fatigue 12/28/2010  . Breast  cancer 03/07/2011  . Bipolar 1 disorder   . S/P radiation therapy 05/15/11 - 07/01/11    Left Breast: 4500 cGy/25 fractions with Boost to Left chest Wall/Mastectomy Scar for Toal dose of 5940 cGy  . Status post chemotherapy     4 cycles AC  . Status post chemotherapy 01/31/11 - 04/18/11    Taxol x 12 weeks  . PONV (postoperative nausea and vomiting)   . Movement disorder   Stage 3 kidney disease  PAST SURGICAL HISTORY: Past Surgical History  Procedure Laterality Date  . Bladder tuck    . 2 orbital fracture surgeries    . Sinus exploration    . Bladder suspension      done 2005  . Mastectomy modified  radical  10/23/2010    Left Dr Margot Chimes  . Portacath placement  11/28/2010    via right subclavian - Dr Margot Chimes  . Abdominal hysterectomy      uterus removed only  . Breast surgery      Axillary dissection  . Port-a-cath removal  08/21/2011    Procedure: REMOVAL PORT-A-CATH;  Surgeon: Haywood Lasso, MD;  Location: [facility];  Service: General;  Laterality: Right;    FAMILY HISTORY: Family History  Problem Relation Age of Onset  . Cancer Maternal Aunt     breast, thyroid, colonm    SOCIAL HISTORY: History   Social History  . Marital Status: Married    Spouse Name: Vanessa Mills     Number of Children: 1  . Years of Education: Masters   Occupational History  . Not on file.   Social History Main Topics  . Smoking status: Never Smoker   . Smokeless tobacco: Never Used  . Alcohol Use: 1.7 oz/week    2 Glasses of wine, 1 Drinks containing 0.5 oz of alcohol per week  . Drug Use: No     Comment: in college  . Sexual Activity: Yes     Comment: erpr+/HER-2 neg.   Other Topics Concern  . Not on file   Social History Narrative   Married to [place]   One dtr- 54 years old- Vanessa Mills Comment- in [place]   Patient has a Masters   Patient  has 1 child.      PHYSICAL EXAM  Filed Vitals:   08/12/13 1325  BP: 148/92  Pulse: 56  Height: _0  (1.727 m)  Weight: 198 lb (89.812 kg)   Body mass index is 30.11 kg/(m^2).  Generalized: Well developed, in no acute distress  Head: normocephalic and atraumatic,. Oropharynx benign , mild tremor of the head Neck: Supple, no carotid bruits  Cardiac: Regular rate rhythm, no murmur  Musculoskeletal: No deformity   Neurological examination   Mentation: Alert oriented to time, place, history taking. Follows all commands speech and language fluent, only mild head shaking.  Cranial nerve II-XII: Pupils were equal round reactive to light extraocular movements were full, visual field were full on confrontational test. Facial sensation  and strength were normal. hearing was intact to finger rubbing bilaterally. Uvula tongue midline. head turning and shoulder shrug were normal and symmetric.Tongue protrusion into cheek strength was normal. Motor: normal bulk and tone, full strength in the BUE, BLE, fine finger movements normal, no pronator drift. No focal weakness Sensory: normal and symmetric to light touch, pinprick, and  vibration  Coordination: finger-nose-finger, heel-to-shin bilaterally, no dysmetria Reflexes: 1+ upper lower and symetric,  plantar responses were neutral Gait and Station: Rising up from seated position without assistance,  normal stance,  moderate stride, good arm swing, smooth turning, able to perform tiptoe, and heel walking without difficulty. Tandem gait is steady  DIAGNOSTIC DATA (LABS, IMAGING, TESTING) - I reviewed patient records, labs, notes, testing and imaging myself where available.  Lab Results  Component Value Date   WBC 5.2 03/04/2013   HGB 13.8 03/04/2013   HCT 41.5 03/04/2013   MCV 93.1 03/04/2013   PLT 269 03/04/2013      Component Value Date/Time   NA 141 03/04/2013 1017   NA 141 09/18/2011 1305   K 3.9 03/04/2013 1017   K 3.8 09/18/2011 1305   CL 104 03/05/2012 1005   CL 103 09/18/2011 1305   CO2 25 03/04/2013 1017   CO2 30 09/18/2011 1305   GLUCOSE 88 03/04/2013 1017   GLUCOSE 93 03/05/2012 1005   GLUCOSE 71 09/18/2011 1305   BUN 15.9 03/04/2013 1017   BUN 16 09/18/2011 1305   CREATININE 0.8 03/04/2013 1017   CREATININE 0.95 09/18/2011 1305   CALCIUM 9.8 03/04/2013 1017   CALCIUM 10.0 09/18/2011 1305   PROT 6.8 03/04/2013 1017   PROT 7.0 09/18/2011 1305   ALBUMIN 4.2 03/04/2013 1017   ALBUMIN 4.3 09/18/2011 1305   AST 17 03/04/2013 1017   AST 17 09/18/2011 1305   ALT 17 03/04/2013 1017   ALT 12 09/18/2011 1305   ALKPHOS 93 03/04/2013 1017   ALKPHOS 80 09/18/2011 1305   BILITOT 0.59 03/04/2013 1017   BILITOT 0.4 09/18/2011 1305   GFRNONAA >90 11/26/2010 1107   GFRAA >90 11/26/2010 1107   Lab Results  Component  Value Date   CHOL 214* 03/04/2013   HDL 65 03/04/2013   LDLCALC 111* 03/04/2013   TRIG 189* 03/04/2013   CHOLHDL 3.3 03/04/2013   ASSESSMENT AND PLAN  53 y.o. year old female  has a past medical history of Bipolar 1 disorder; Movement disorder, essential tremor and history of cervical dystonia. Patient has refused further Botox treatments, was not helping her head tremor.   Continue propranolol 40 mg twice daily, renewed  Continue Xanax ER 34m 2 tabs po bid, twice daily, I was able to get her previous Xanax refill history from pharmacy, confirmed, that she was getting Xanax Er 133m120 tabs each month, I also got her most recent visit with the psychiatrist Dr. KaToy Carein May 13 2013, she was treated by Dr. KaToy Careor bipolar disorder, was given prescription of Klonopin 2 mg every night, Valium 10 mg twice a day,Saphris 1051mublingual.   I also called Dr. KauStarleen Armsfice, made her aware that our office will continue to refill her Xanax ER 1mg70mtabs twice a day.   Followup yearly   Time spend in coordinating her Mills is 60 minutes.  [facility] [address] [phone]

## 2013-08-13 NOTE — Addendum Note (Signed)
Addended by: Marcial Pacas on: 08/13/2013 09:55 AM   Modules accepted: Level of Service

## 2013-08-16 ENCOUNTER — Telehealth: Payer: Self-pay | Admitting: *Deleted

## 2013-08-16 NOTE — Telephone Encounter (Signed)
Patient calling in about the transfer of care to Dr Lindi Adie. She states she would like to request Dr Jana Hakim to be her Oncologist at this point.  Informed patient that he was only taking active chemo patient transfers at this time, but that I would certainly ask since her appt is not until October 2015.

## 2013-08-17 ENCOUNTER — Other Ambulatory Visit: Payer: Self-pay

## 2013-08-17 DIAGNOSIS — C50419 Malignant neoplasm of upper-outer quadrant of unspecified female breast: Secondary | ICD-10-CM

## 2013-08-17 NOTE — Progress Notes (Signed)
Gave Vanessa Mills an appointment for 09-07-13 with Vanessa Mills as she wanted an appointment sooner than Oct. With Vanessa Mills  for follow up as she was to see Vanessa Mills in August.

## 2013-08-19 ENCOUNTER — Telehealth: Payer: Self-pay

## 2013-08-19 ENCOUNTER — Telehealth: Payer: Self-pay | Admitting: *Deleted

## 2013-08-19 NOTE — Telephone Encounter (Signed)
Order faxed to [facility] for [id number] and [id number].  Sent to scan.

## 2013-08-19 NOTE — Telephone Encounter (Signed)
Notified pt of future appts on 09/07/13 and 11/19/13

## 2013-09-01 ENCOUNTER — Ambulatory Visit: Payer: Medicare Other

## 2013-09-01 ENCOUNTER — Ambulatory Visit: Payer: Medicare Other | Admitting: Oncology

## 2013-09-07 ENCOUNTER — Telehealth: Payer: Self-pay | Admitting: Adult Health

## 2013-09-07 ENCOUNTER — Ambulatory Visit (HOSPITAL_BASED_OUTPATIENT_CLINIC_OR_DEPARTMENT_OTHER): Payer: Medicare Other | Admitting: Adult Health

## 2013-09-07 ENCOUNTER — Other Ambulatory Visit (HOSPITAL_BASED_OUTPATIENT_CLINIC_OR_DEPARTMENT_OTHER): Payer: Medicare Other

## 2013-09-07 ENCOUNTER — Encounter: Payer: Self-pay | Admitting: Adult Health

## 2013-09-07 VITALS — BP 188/97 | HR 53 | Temp 98.2°F | Resp 18 | Ht 68.0 in | Wt 199.5 lb

## 2013-09-07 DIAGNOSIS — C50419 Malignant neoplasm of upper-outer quadrant of unspecified female breast: Secondary | ICD-10-CM

## 2013-09-07 DIAGNOSIS — C50412 Malignant neoplasm of upper-outer quadrant of left female breast: Secondary | ICD-10-CM

## 2013-09-07 DIAGNOSIS — I129 Hypertensive chronic kidney disease with stage 1 through stage 4 chronic kidney disease, or unspecified chronic kidney disease: Secondary | ICD-10-CM

## 2013-09-07 DIAGNOSIS — R232 Flushing: Secondary | ICD-10-CM

## 2013-09-07 DIAGNOSIS — N183 Chronic kidney disease, stage 3 unspecified: Secondary | ICD-10-CM

## 2013-09-07 DIAGNOSIS — Z170 Estrogen receptor positive status [ER+]: Secondary | ICD-10-CM

## 2013-09-07 DIAGNOSIS — N951 Menopausal and female climacteric states: Secondary | ICD-10-CM

## 2013-09-07 DIAGNOSIS — E2839 Other primary ovarian failure: Secondary | ICD-10-CM

## 2013-09-07 LAB — CBC WITH DIFFERENTIAL/PLATELET
BASO%: 0.3 % (ref 0.0–2.0)
Basophils Absolute: 0 10*3/uL (ref 0.0–0.1)
EOS%: 0 % (ref 0.0–7.0)
Eosinophils Absolute: 0 10*3/uL (ref 0.0–0.5)
HEMATOCRIT: 45.7 % (ref 34.8–46.6)
HGB: 15 g/dL (ref 11.6–15.9)
LYMPH%: 7.5 % — ABNORMAL LOW (ref 14.0–49.7)
MCH: 31 pg (ref 25.1–34.0)
MCHC: 32.9 g/dL (ref 31.5–36.0)
MCV: 94.3 fL (ref 79.5–101.0)
MONO#: 0.5 10*3/uL (ref 0.1–0.9)
MONO%: 2.9 % (ref 0.0–14.0)
NEUT%: 89.3 % — AB (ref 38.4–76.8)
NEUTROS ABS: 14.5 10*3/uL — AB (ref 1.5–6.5)
Platelets: 344 10*3/uL (ref 145–400)
RBC: 4.84 10*6/uL (ref 3.70–5.45)
RDW: 13.9 % (ref 11.2–14.5)
WBC: 16.3 10*3/uL — ABNORMAL HIGH (ref 3.9–10.3)
lymph#: 1.2 10*3/uL (ref 0.9–3.3)

## 2013-09-07 LAB — COMPREHENSIVE METABOLIC PANEL
ALK PHOS: 89 U/L (ref 39–117)
ALT: 25 U/L (ref 0–35)
AST: 19 U/L (ref 0–37)
Albumin: 4.7 g/dL (ref 3.5–5.2)
BUN: 15 mg/dL (ref 6–23)
CO2: 23 mEq/L (ref 19–32)
Calcium: 9.8 mg/dL (ref 8.4–10.5)
Chloride: 101 mEq/L (ref 96–112)
Creatinine, Ser: 0.88 mg/dL (ref 0.50–1.10)
GLUCOSE: 135 mg/dL — AB (ref 70–99)
POTASSIUM: 3.9 meq/L (ref 3.5–5.3)
SODIUM: 137 meq/L (ref 135–145)
TOTAL PROTEIN: 7.7 g/dL (ref 6.0–8.3)
Total Bilirubin: 0.6 mg/dL (ref 0.2–1.2)

## 2013-09-07 MED ORDER — GABAPENTIN 300 MG PO CAPS: 300.0000 mg | ORAL_CAPSULE | Freq: Every day | ORAL | Status: DC

## 2013-09-07 NOTE — Telephone Encounter (Signed)
per pof to sch pt appt-sch BD @ [facility]-gave pt appt time & date-gave pt copy of sch

## 2013-09-07 NOTE — Patient Instructions (Signed)
You are doing well. You have no sign of recurrence.  I recommend healthy diet, exercise, and monthly breast exams.  Continue taking Letrozole daily.    Please call us if you have any questions or concerns.    We will see you back in 6 months.

## 2013-09-07 NOTE — Telephone Encounter (Signed)
per pof to sch appt-sch appt and gave pt copy of sch

## 2013-09-07 NOTE — Progress Notes (Signed)
OFFICE PROGRESS NOTE  CC  Vanessa Mc, MD  Vanessa Bellows, MD [place] [address] [place] [address]  DIAGNOSIS: 53 year old female with:  1.  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  PRIOR THERAPY:  #1 status post mastectomy with axillary lymph node dissection for a stage IIIa invasive lobular carcinoma measuring 6 x 8 cm with 6 positive lymph nodes.  #2 S/P 4 cycles of AC q 2 weeks with day 2 neulasta  #3  status post weekly Taxol starting on 01/31/2011 -  Completed 04/18/11. She received total of 12 weeks.  #4. S/p Radiation therapy to the left chest wall from 4/17 - 07/01/11  #5 patient began adjuvant antiestrogen therapy with letrozole 2.5 mg daily in June 2013. A total of 5 years of therapy is planned.  CURRENT THERAPY: Adjuvant letrozole 2.5 mg daily  INTERVAL HISTORY: Vanessa Mills 53 y.o. female returns for followup visit today.  Vanessa Mills is taking Letrozole daily.  She is experiencing hot flashes, mostly at night and has difficulty sleeping due to this.  She denies joint aches, vaginal dryness.  She does have dry eyes.  She denies fevers, chills, nausea, vomiting, constipation, diarrhea, bladder changes, new pain, headaches, weakness or any further concerns.    MEDICAL HISTORY: Past Medical History  Diagnosis Date  . Chronic mental illness   . Meniere's syndrome   . Crohn's disease   . Raynaud's disease   . GERD (gastroesophageal reflux disease)     does not take medications for   . Headache(784.0)     takes midodrine for migraines prn  . Breast cancer, ILC, Left, receptor+, Her2- 10/10/2010  .  Mental disorder     bipolar, takes saphris at hs  . Chemotherapy follow-up examination 12/28/2010  . Fatigue 12/28/2010  . Breast cancer 03/07/2011  . Bipolar 1 disorder   . S/P radiation therapy 05/15/11 - 07/01/11    Left Breast: 4500 cGy/25 fractions with Boost to Left chest Wall/Mastectomy Scar for Toal dose of 5940 cGy  . Status post chemotherapy     4 cycles AC  . Status post chemotherapy 01/31/11 - 04/18/11    Taxol x 12 weeks  . PONV (postoperative nausea and vomiting)   . Movement disorder     ALLERGIES:  has No Known Allergies.  MEDICATIONS:  Current Outpatient Prescriptions  Medication Sig Dispense Refill  . ALPRAZolam (XANAX XR) 1 MG 24 hr tablet 2 tabs po bid  120 tablet  5  . asenapine (SAPHRIS) 5 MG SUBL Place 10 mg under the tongue at bedtime.       [name] letrozole (FEMARA) 2.5 MG tablet Take 1 tablet (2.5 mg total) by mouth daily.  90 tablet  6  . lisinopril (PRINIVIL,ZESTRIL) 20 MG tablet Take 20 mg by mouth daily.      . propranolol (INDERAL) 40 MG tablet Take 1 tablet (40 mg total) by mouth 2 (two) times daily.  60 tablet  11  . isometheptene-acetaminophen-dichloralphenazone (MIDRIN) 65-325-100 MG capsule as needed.        No current facility-administered medications for this visit.    SURGICAL HISTORY:  Past Surgical History  Procedure Laterality Date  .  Bladder tuck    . 2 orbital fracture surgeries    . Sinus exploration    . Bladder suspension      done 2005  . Mastectomy modified radical  10/23/2010    Left Dr Margot Chimes  . Portacath placement  11/28/2010    via right subclavian - Dr Margot Chimes  . Abdominal hysterectomy      uterus removed only  . Breast surgery      Axillary dissection  . Port-a-cath removal  08/21/2011    Procedure: REMOVAL PORT-A-CATH;  Surgeon: Haywood Lasso, MD;  Location: [facility];  Service: General;  Laterality: Right;    REVIEW OF SYSTEMS:  A 10 point review of systems was conducted and is otherwise negative except  for what is noted above.    Health Maintenance  Mammogram: 12/2012 Colonoscopy: 06/28/2013, h/o Crohn's disease follows closely with GI Bone Density Scan: never Pap Smear: over 10 years ago, s/p TAH Eye Exam: 12/2012 Vitamin D Level: 02/2013 Lipid Panel:02/2013   PHYSICAL EXAMINATION:  BP 188/97  Pulse 53  Temp(Src) 98.2 F (36.8 C) (Oral)  Resp 18  Ht 5' 8" (1.727 m)  Wt 199 lb 8 oz (90.493 kg)  BMI 30.34 kg/m2 GENERAL: Patient is a well appearing female in no acute distress HEENT:  Sclerae anicteric.  Oropharynx clear and moist. No ulcerations or evidence of oropharyngeal candidiasis. Neck is supple.  NODES:  No cervical, supraclavicular, or axillary lymphadenopathy palpated.  BREAST EXAM:  Left mastectomy site without nodularity, right breast no mass or lesion, benign bilateral breast exam LUNGS:  Clear to auscultation bilaterally.  No wheezes or rhonchi. HEART:  Regular rate and rhythm. No murmur appreciated. ABDOMEN:  Soft, nontender.  Positive, normoactive bowel sounds. No organomegaly palpated. MSK:  No focal spinal tenderness to palpation. Full range of motion bilaterally in the upper extremities. EXTREMITIES:  No peripheral edema.   SKIN:  Clear with no obvious rashes or skin changes. No nail dyscrasia. NEURO:  Nonfocal. Well oriented.  Appropriate affect. ECOG PERFORMANCE STATUS: 1 - Symptomatic but completely ambulatory    LABORATORY DATA: Lab Results  Component Value Date   WBC 16.3* 09/07/2013   HGB 15.0 09/07/2013   HCT 45.7 09/07/2013   MCV 94.3 09/07/2013   PLT 344 09/07/2013      Chemistry      Component Value Date/Time   NA 141 03/04/2013 1017   NA 141 09/18/2011 1305   K 3.9 03/04/2013 1017   K 3.8 09/18/2011 1305   CL 104 03/05/2012 1005   CL 103 09/18/2011 1305   CO2 25 03/04/2013 1017   CO2 30 09/18/2011 1305   BUN 15.9 03/04/2013 1017   BUN 16 09/18/2011 1305   CREATININE 0.8 03/04/2013 1017   CREATININE 0.95 09/18/2011 1305      Component Value Date/Time    CALCIUM 9.8 03/04/2013 1017   CALCIUM 10.0 09/18/2011 1305   ALKPHOS 93 03/04/2013 1017   ALKPHOS 80 09/18/2011 1305   AST 17 03/04/2013 1017   AST 17 09/18/2011 1305   ALT 17 03/04/2013 1017   ALT 12 09/18/2011 1305   BILITOT 0.59 03/04/2013 1017   BILITOT 0.4 09/18/2011 1305       RADIOGRAPHIC STUDIES:  No results found.  ASSESSMENT/PLAN: 53 year old female with:  #1.  stage III breast cancer s/p mastectomy of the left breast which showed a 5 cm mass with 6 positive lymph nodes, lobular features. She has no sign of recurrence.  #  2.  Patient is status post 4 cycles of dose dense Adriamycin and Cytoxan which she tolerated very well. She is status post 12 weeks of Taxol. Overall she has tolerated this quite well.  #3 patient began antiestrogen therapy with letrozole in June 2013. Patient is tolerating it well. 5 years of therapy was planned and she is tolerating this moderately well.  She has no sign of recurrence and will continue this.    #4 Patient has stage III kidney disease and sees a nephrologist, and her BP medications were changed, and recently had added Lisinopril.  She is hypertensive today and we will forward these VS to her nephrologist.    #5 Patient has h/o bipolar and is followed by Dr. Robina Ade and takes Saphris 68m QHS.    #6  Due to her hot flashes, I have prescribed Gabapentin 3039mQHS.  I gave detailed information to her in her AVS about this.    #7  I reviewed her health maintenance with her in detail.  She has never had a bone density and is taking Letrozole daily for 2 years.  I ordered one today.  I recommended healthy diet, exercise, and monthly breast exams.   [place] return in 6 months for labs and evaluation.    All questions were answered. The patient knows to call the clinic with any problems, questions or concerns. We can certainly see the patient much sooner if necessary.  I spent 25 minutes counseling and coordination of care. The total time spent in the  appointment was 30 minutes.  LiMinette [phone]

## 2013-09-08 ENCOUNTER — Telehealth: Payer: Self-pay | Admitting: *Deleted

## 2013-09-08 NOTE — Telephone Encounter (Signed)
Received voice message from patient asking if Mendel Ryder, NP, had faxed over yesterday's notes to her nephrologist. Patient stated, "my blood pressure yesterday was 189/97. The MD changed my medications around. I had two episodes of severe chest pain last night that woke me up from a sleep and one episode this morning. My blood pressure this morning is 160/101." I asked patient what she did about the chest pain last night. Patient stated,"i didn't do anything. After the pain went away I went back to sleep." I informed patient that she should call her MD who switched her blood pressure medications around. If chest pain is severe again, she needs to go to the ER or call 911 if she is by herself. Patient verbalized understanding.

## 2013-09-09 ENCOUNTER — Telehealth: Payer: Self-pay

## 2013-09-09 NOTE — Telephone Encounter (Signed)
Faxed order for bone denisty to [facility].  Sent to scan.

## 2013-09-14 ENCOUNTER — Encounter: Payer: Self-pay | Admitting: Cardiology

## 2013-09-14 ENCOUNTER — Ambulatory Visit (INDEPENDENT_AMBULATORY_CARE_PROVIDER_SITE_OTHER): Payer: Medicare Other | Admitting: Cardiology

## 2013-09-14 VITALS — BP 148/97 | HR 50 | Ht 68.0 in | Wt 195.0 lb

## 2013-09-14 DIAGNOSIS — R9431 Abnormal electrocardiogram [ECG] [EKG]: Secondary | ICD-10-CM | POA: Insufficient documentation

## 2013-09-14 DIAGNOSIS — I119 Hypertensive heart disease without heart failure: Secondary | ICD-10-CM

## 2013-09-14 DIAGNOSIS — R0600 Dyspnea, unspecified: Secondary | ICD-10-CM

## 2013-09-14 DIAGNOSIS — R0989 Other specified symptoms and signs involving the circulatory and respiratory systems: Secondary | ICD-10-CM

## 2013-09-14 DIAGNOSIS — R0609 Other forms of dyspnea: Secondary | ICD-10-CM | POA: Insufficient documentation

## 2013-09-14 DIAGNOSIS — Z853 Personal history of malignant neoplasm of breast: Secondary | ICD-10-CM

## 2013-09-14 MED ORDER — LISINOPRIL 20 MG PO TABS: 20.0000 mg | ORAL_TABLET | Freq: Two times a day (BID) | ORAL | Status: DC

## 2013-09-14 MED ORDER — HYDRALAZINE HCL 10 MG PO TABS: 10.0000 mg | ORAL_TABLET | Freq: Three times a day (TID) | ORAL | Status: DC

## 2013-09-14 NOTE — Patient Instructions (Signed)
Your physician has requested that you have a lexiscan myoview. For further information please visit [email or web address]. Please follow instruction sheet, as given.  Your physician has recommended you make the following change in your medication:  1. START HYDRALAZINE 10 MG 1 TABLET THREE TIMES DAILY; NEW RX WAS SENT IN TODAY  Your physician recommends that you schedule a follow-up appointment in: AS NEEDED

## 2013-09-14 NOTE — Progress Notes (Signed)
Vanessa Mills Date of Birth:  [birth year] [place] [address] [employer] [place], [place]  [address] [phone]        Fax   [phone]   History of Present Illness: This 53 year old woman is seen at the request of Dr. Maurice Small for evaluation of chest pain and shortness of breath.  The patient does not have any history of known heart problems.  She does have a past history of hypertension.  She is followed by nephrology in [facility].  Recently had a routine visit her blood pressure was 189/110 and she had some chest discomfort the previous evening and she was sent to [place] [facility] on 09/09/13 where she was evaluated and released.  Her chest x-ray at that time was normal.  She states that previously she had a normal renal function.  More recently her nephrologist had switched her from amlodipine to lisinopril and had stopped her diuretic in her renal function has improved.  The patient gives a history of exertional dyspnea.  She notes occasional rapid heart rate and tachycardia palpitations.  She is a nonsmoker.  She does have elevated lipids and in February 2015 her LDL was 111.  She is not diabetic. Her family history reveals her father died of heart failure at age 48.  Her mother is living but has diabetes and high blood pressure.  The patient states that her younger sister had a heart attack in [place] at age 46.  No further details about that are known by the patient. The patient has a history of breast cancer of the left breast.  Her surgeon is Dr. Margot Chimes.  Her oncologist was Dr. Humphrey Rolls.  The patient is in the year 2 of taking  Femara.  Her breast cancer was initially discovered in September of 2012.  Current Outpatient Prescriptions  Medication Sig Dispense Refill  . ALPRAZolam (XANAX XR) 1 MG 24 hr tablet 2 tabs po bid  120 tablet  5  . asenapine (SAPHRIS) 5 MG SUBL Place 10 mg under the tongue at bedtime.       . gabapentin (NEURONTIN) 300 MG capsule Take 1  capsule (300 mg total) by mouth at bedtime.  30 capsule  3  . isometheptene-acetaminophen-dichloralphenazone (MIDRIN) 65-325-100 MG capsule as needed.       [name] letrozole (FEMARA) 2.5 MG tablet Take 1 tablet (2.5 mg total) by mouth daily.  90 tablet  6  . lisinopril (PRINIVIL,ZESTRIL) 20 MG tablet Take 1 tablet (20 mg total) by mouth 2 (two) times daily.      . propranolol (INDERAL) 40 MG tablet Take 1 tablet (40 mg total) by mouth 2 (two) times daily.  60 tablet  11  . hydrALAZINE (APRESOLINE) 10 MG tablet Take 1 tablet (10 mg total) by mouth 3 (three) times daily.  90 tablet  11   No current facility-administered medications for this visit.    No Known Allergies  Patient Active Problem List   Diagnosis Date Noted  . DOE (dyspnea on exertion) 09/14/2013  . Abnormal EKG 09/14/2013  . Essential tremor 06/05/2012  . Abnormal involuntary movements(781.0) 06/05/2011  . 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 Bipolar disorder 01/30/2011  . Chemotherapy follow-up examination 12/28/2010  . Fatigue 12/28/2010  . Breast cancer, ILC, Left, receptor+, Her2- 10/10/2010    History  Smoking status  . Never Smoker   Smokeless tobacco  . Never Used    History  Alcohol Use  . 1.7 oz/week  .  2 Glasses of wine, 1 Drinks containing 0.5 oz of alcohol per week    Family History  Problem Relation Age of Onset  . Cancer Maternal Aunt     breast, thyroid, colonm  . Heart failure Father   . Heart attack Sister   . Hypertension Mother   . Diabetes Mother     Review of Systems: Constitutional: no fever chills diaphoresis or fatigue or change in weight.  Head and neck: no hearing loss, no epistaxis, no photophobia or visual disturbance. Respiratory: Positive for shortness of breath with exertion Cardiovascular: No chest pain peripheral edema, palpitations. Gastrointestinal: No abdominal distention, no abdominal pain, no change in bowel habits hematochezia or melena. Genitourinary: No dysuria, no  frequency, no urgency, no nocturia. Musculoskeletal:No arthralgias, no back pain, no gait disturbance or myalgias. Neurological: No dizziness, no headaches, no numbness, no seizures, no syncope, no weakness, no tremors. Hematologic: No lymphadenopathy, no easy bruising. Psychiatric: No confusion, no hallucinations, no sleep disturbance.  Positive for bipolar disorder   Physical Exam: Filed Vitals:   09/14/13 0941  BP: 148/97  Pulse: 50   the general appearance reveals a well-developed well-nourished woman in no distress.The head and neck exam reveals pupils equal and reactive.  Extraocular movements are full.  There is no scleral icterus.  The mouth and pharynx are normal.  The neck is supple.  The carotids reveal no bruits.  The jugular venous pressure is normal.  The  thyroid is not enlarged.  There is no lymphadenopathy.  The chest is clear to percussion and auscultation.  There are no rales or rhonchi.  Expansion of the chest is symmetrical.  The precordium is quiet.  The first heart sound is normal.  The second heart sound is physiologically split.  There is no murmur gallop rub or click.  There is no abnormal lift or heave.  The abdomen is soft and nontender.  The bowel sounds are normal.  The liver and spleen are not enlarged.  There are no abdominal masses.  There are no abdominal bruits.  Extremities reveal good pedal pulses.  There is no phlebitis or edema.  There is no cyanosis or clubbing.  Strength is normal and symmetrical in all extremities.  There is no lateralizing weakness.  There are no sensory deficits.  The skin is warm and dry.  There is no rash.  EKG shows sinus bradycardia.  There are T-wave inversions in leads V1 through V3 which are new since the previous available EKG of 10/18/10.  Assessment / Plan: 1. recent episode of chest pain associated with hypertension.  History of hypertension for 9 years. 2. abnormal EKG 3. dyspnea on exertion 4. Hypercholesterolemia 5.  history of breast cancer in 2012, on Femara 6. history of bipolar disorder  Plan: To evaluate her chest pain and her abnormal EKG further, we will have her return for a Lexiscan Myoview stress test. Her blood pressure is still not optimally controlled we will add low-dose hydralazine 10 mg 3 times a day. Many thanks for the opportunity to see this pleasant woman with you.  I will be in touch regarding the results of her studies.

## 2013-09-16 ENCOUNTER — Ambulatory Visit (HOSPITAL_COMMUNITY): Payer: Medicare Other | Attending: Cardiovascular Disease | Admitting: Radiology

## 2013-09-16 VITALS — BP 133/101 | HR 43 | Ht 68.0 in | Wt 198.0 lb

## 2013-09-16 DIAGNOSIS — Z8249 Family history of ischemic heart disease and other diseases of the circulatory system: Secondary | ICD-10-CM | POA: Diagnosis not present

## 2013-09-16 DIAGNOSIS — R0609 Other forms of dyspnea: Secondary | ICD-10-CM | POA: Insufficient documentation

## 2013-09-16 DIAGNOSIS — R9431 Abnormal electrocardiogram [ECG] [EKG]: Secondary | ICD-10-CM | POA: Diagnosis not present

## 2013-09-16 DIAGNOSIS — R0989 Other specified symptoms and signs involving the circulatory and respiratory systems: Principal | ICD-10-CM | POA: Insufficient documentation

## 2013-09-16 DIAGNOSIS — R0600 Dyspnea, unspecified: Secondary | ICD-10-CM

## 2013-09-16 DIAGNOSIS — R079 Chest pain, unspecified: Secondary | ICD-10-CM

## 2013-09-16 MED ORDER — TECHNETIUM TC 99M SESTAMIBI GENERIC - CARDIOLITE
10.0000 | Freq: Once | INTRAVENOUS | Status: AC | PRN
  Administered 2013-09-16: 10 via INTRAVENOUS

## 2013-09-16 MED ORDER — TECHNETIUM TC 99M SESTAMIBI GENERIC - CARDIOLITE
30.0000 | Freq: Once | INTRAVENOUS | Status: AC | PRN
  Administered 2013-09-16: 30 via INTRAVENOUS

## 2013-09-16 MED ORDER — REGADENOSON 0.4 MG/5ML IV SOLN
0.4000 mg | Freq: Once | INTRAVENOUS | Status: AC
  Administered 2013-09-16: 0.4 mg via INTRAVENOUS

## 2013-09-16 NOTE — Progress Notes (Signed)
[facility] [address] [place], [place] [address] [phone]    Cardiology Nuclear Med Study  Vanessa Mills is a 53 y.o. female     MRN : [id number]     DOB: [birth year]  Procedure Date: 09/16/2013  Nuclear Med Background Indication for Stress Test:  Evaluation for Ischemia, and Patient seen in hospital on 09-09-2013 for Chest Pain, BP 189/110 ([facility]) History:  No known CAD Cardiac Risk Factors: Family History - CAD and Hypertension  Symptoms:  Chest Pain (last date of chest discomfort was 09/11/13), DOE, Palpitations and SOB   Nuclear Pre-Procedure Caffeine/Decaff Intake:  None> 12 hrs NPO After: 7:00pm   Lungs:  clear O2 Sat: 96% on room air. IV 0.9% NS with Angio Cath:  24g  IV Site: R Hand x 1, tolerated well IV Started by:  Irven Baltimore, RN  Chest Size (in):  40 Cup Size: C, (L) Mastectomy, no implants  Height: 5' 8"  (1.727 m)  Weight:  198 lb (89.812 kg)  BMI:  Body mass index is 30.11 kg/(m^2). Tech Comments:  Inderal, Lisinopril, and Hydralazine this am. Allen Derry.    Nuclear Med Study 1 or 2 day study: 1 day  Stress Test Type:  Carlton Adam  Reading MD: N/A  Order Authorizing Provider:  Darlin Coco, MD  Resting Radionuclide: Technetium 3mSestamibi  Resting Radionuclide Dose: 11.0 mCi   Stress Radionuclide:  Technetium 951mestamibi  Stress Radionuclide Dose: 33.0 mCi           Stress Protocol Rest HR: 43 Stress HR: 62  Rest BP: 133/101 Stress BP: 112/76  Exercise Time (min): n/a METS: n/a           Dose of Adenosine (mg):  n/a Dose of Lexiscan: 0.4 mg  Dose of Atropine (mg): n/a Dose of Dobutamine: n/a mcg/kg/min (at max HR)  Stress Test Technologist: ShGlade LloydBS-ES  Nuclear Technologist:  ElVedia PereyraCNMT     Rest Procedure:  Myocardial perfusion imaging was performed at rest 45 minutes following the intravenous administration of Technetium 9962mstamibi. Rest ECGSTM:HDQQIradycardia  43 bpm.  T wave inversion V1-V3.    Stress Procedure:  The patient received IV Lexiscan 0.4 mg over 15-seconds.  Technetium 63m25mtamibi injected at 30-seconds.  Quantitative spect images were obtained after a 45 minute delay.  During the infusion of Lexiscan the patient complained of feeling SOB and dizzy.  These symptoms began to resolve in recovery.  Stress ECG: No significant change from baseline ECG  QPS Raw Data Images:  Soft tissue (diaphragm, bowel activity) underlies heart. Stress Images: Inferior thinning (base, mid, distal) Otherwise normal perfusion.   Rest Images: No significant change from the rest images. Subtraction (SDS):  Quantitation suggests minimal inferior ischemia.   Transient Ischemic Dilatation (Normal <1.22):  0.98 Lung/Heart Ratio (Normal <0.45):  0.22  Quantitative Gated Spect Images QGS EDV:  125 ml QGS ESV:  57 ml  Impression Exercise Capacity:  Lexiscan with no exercise. BP Response:  Normal blood pressure response. Clinical Symptoms:  No chest pain. ECG Impression:  No significant ST segment change suggestive of ischemia. Comparison with Prior Nuclear Study: No prior study  Overall Impression:  Probable normal perfusion and soft tissue attenuation. (diaphragm, bowel activity) Cannot r/o subendocardial scar.    Overall low risk scan.    LV Ejection Fraction: 54%.  LV Wall Motion:  Normal wall thickening  PaulDorris Carnes

## 2013-09-20 ENCOUNTER — Telehealth: Payer: Self-pay | Admitting: *Deleted

## 2013-09-20 NOTE — Telephone Encounter (Signed)
Message copied by Earvin Hansen on Mon Sep 20, 2013  6:02 PM ------      Message from: Darlin Coco      Created: Sat Sep 18, 2013  6:20 PM       Please report.  The lexiscan myoview was normal.  No ischemia. Normal EF. CSD.      Send copy to Dr. Maurice Small ------

## 2013-09-20 NOTE — Telephone Encounter (Signed)
Advised of results

## 2013-09-28 ENCOUNTER — Other Ambulatory Visit (HOSPITAL_BASED_OUTPATIENT_CLINIC_OR_DEPARTMENT_OTHER): Payer: Medicare Other

## 2013-09-28 ENCOUNTER — Other Ambulatory Visit: Payer: Self-pay | Admitting: Adult Health

## 2013-09-28 DIAGNOSIS — D72829 Elevated white blood cell count, unspecified: Secondary | ICD-10-CM

## 2013-09-28 LAB — CBC WITH DIFFERENTIAL/PLATELET
BASO%: 0.3 % (ref 0.0–2.0)
Basophils Absolute: 0 10*3/uL (ref 0.0–0.1)
EOS%: 0.3 % (ref 0.0–7.0)
Eosinophils Absolute: 0 10*3/uL (ref 0.0–0.5)
HCT: 43.6 % (ref 34.8–46.6)
HGB: 14.4 g/dL (ref 11.6–15.9)
LYMPH%: 29.4 % (ref 14.0–49.7)
MCH: 31.7 pg (ref 25.1–34.0)
MCHC: 33 g/dL (ref 31.5–36.0)
MCV: 96 fL (ref 79.5–101.0)
MONO#: 0.5 10*3/uL (ref 0.1–0.9)
MONO%: 8 % (ref 0.0–14.0)
NEUT%: 62 % (ref 38.4–76.8)
NEUTROS ABS: 4.1 10*3/uL (ref 1.5–6.5)
PLATELETS: 317 10*3/uL (ref 145–400)
RBC: 4.54 10*6/uL (ref 3.70–5.45)
RDW: 13.5 % (ref 11.2–14.5)
WBC: 6.5 10*3/uL (ref 3.9–10.3)
lymph#: 1.9 10*3/uL (ref 0.9–3.3)

## 2013-10-21 ENCOUNTER — Telehealth: Payer: Self-pay | Admitting: Cardiology

## 2013-10-21 DIAGNOSIS — R0600 Dyspnea, unspecified: Secondary | ICD-10-CM

## 2013-10-21 DIAGNOSIS — R0609 Other forms of dyspnea: Principal | ICD-10-CM

## 2013-10-21 NOTE — Telephone Encounter (Signed)
New message           Pt states that the hospital sent her a letter and it says she has an enlarged heart / What should she do?

## 2013-10-21 NOTE — Telephone Encounter (Signed)
Discuss with  Dr. Mare Ferrari

## 2013-10-28 NOTE — Telephone Encounter (Signed)
Follow up    Patient calling stating she went to emergency in [place] on  8/13 was told she had a enlarged heart - with no pericardial effusion.   Patient is asking did Dr. Mare Ferrari aware of this.

## 2013-10-29 NOTE — Telephone Encounter (Signed)
Dr. Mare Ferrari reviewed chart and found CT showed mildly enlarged heart. Will order echo for evaluation. Spoke with patient and scheduled as recommended.

## 2013-11-03 ENCOUNTER — Other Ambulatory Visit: Payer: Self-pay

## 2013-11-03 ENCOUNTER — Ambulatory Visit (HOSPITAL_COMMUNITY): Payer: Medicare Other | Attending: Family Medicine | Admitting: Cardiology

## 2013-11-03 DIAGNOSIS — E785 Hyperlipidemia, unspecified: Secondary | ICD-10-CM | POA: Diagnosis not present

## 2013-11-03 DIAGNOSIS — R0609 Other forms of dyspnea: Secondary | ICD-10-CM | POA: Diagnosis not present

## 2013-11-03 DIAGNOSIS — R0600 Dyspnea, unspecified: Secondary | ICD-10-CM

## 2013-11-03 DIAGNOSIS — I10 Essential (primary) hypertension: Secondary | ICD-10-CM | POA: Insufficient documentation

## 2013-11-04 NOTE — Progress Notes (Signed)
Echocardiogram performed by Will Oletta Lamas, RDCS.

## 2013-11-09 ENCOUNTER — Telehealth: Payer: Self-pay | Admitting: Cardiology

## 2013-11-09 ENCOUNTER — Telehealth: Payer: Self-pay | Admitting: *Deleted

## 2013-11-09 DIAGNOSIS — I119 Hypertensive heart disease without heart failure: Secondary | ICD-10-CM

## 2013-11-09 MED ORDER — HYDRALAZINE HCL 25 MG PO TABS: 25.0000 mg | ORAL_TABLET | Freq: Three times a day (TID) | ORAL | Status: DC

## 2013-11-09 NOTE — Telephone Encounter (Signed)
Spoke with patient and her systolic blood pressure is still running in the 150's. Discussed with  Dr. Mare Ferrari and will increase her Hydralazine to 25 mg three times a day. Advised patient and follow up appointment scheduled for 11/23/13, bring blood pressure machine to ov. patient verbalized understanding.

## 2013-11-09 NOTE — Telephone Encounter (Signed)
New message ° ° ° ° °Returning Vanessa Mills's call  ° °

## 2013-11-09 NOTE — Telephone Encounter (Signed)
Advised patient and will send to Dr Beckie Salts

## 2013-11-09 NOTE — Telephone Encounter (Signed)
Message copied by Earvin Hansen on Tue Nov 09, 2013 10:15 AM ------      Message from: Darlin Coco      Created: Thu Nov 04, 2013  5:41 PM       Please report.  The echo is normal.  Good LV systolic function. Mild diastolic dysfunction. Send copy to Dr. Maurice Small ------

## 2013-11-19 ENCOUNTER — Ambulatory Visit: Payer: Medicare Other

## 2013-11-19 ENCOUNTER — Ambulatory Visit: Payer: Medicare Other | Admitting: Hematology and Oncology

## 2013-11-22 ENCOUNTER — Other Ambulatory Visit: Payer: Self-pay | Admitting: Orthopaedic Surgery

## 2013-11-23 ENCOUNTER — Encounter: Payer: Self-pay | Admitting: Cardiology

## 2013-11-23 ENCOUNTER — Ambulatory Visit (INDEPENDENT_AMBULATORY_CARE_PROVIDER_SITE_OTHER): Payer: Medicare Other | Admitting: Cardiology

## 2013-11-23 VITALS — BP 116/74 | HR 54 | Ht 68.0 in | Wt 199.0 lb

## 2013-11-23 DIAGNOSIS — R0600 Dyspnea, unspecified: Secondary | ICD-10-CM

## 2013-11-23 DIAGNOSIS — R0609 Other forms of dyspnea: Secondary | ICD-10-CM

## 2013-11-23 DIAGNOSIS — I119 Hypertensive heart disease without heart failure: Secondary | ICD-10-CM

## 2013-11-23 NOTE — Patient Instructions (Signed)
Your physician recommends that you continue on your current medications as directed. Please refer to the Current Medication list given to you today.  Follow up as needed

## 2013-11-23 NOTE — Progress Notes (Signed)
Vanessa Mills Date of Birth:  September 04, 1960 [place], [place]  [address] [phone]  Fax   [phone]  HPI: This pleasant 53 year old woman is seen for a followup office visit.  She was initially seen on 09/14/13 at the request of Dr. Maurice Small for evaluation of chest pain and shortness of breath. She had a Myoview stress test on 09/17/13 showing an ejection fraction of 54% and overall normal perfusion with soft tissue attenuation and it was felt to be at low risk scan. She had an echocardiogram on 11/03/13 showing an ejection fraction of 55-60% with grade 1 diastolic dysfunction. Since last visit she has been doing well.  Her blood pressure is still high at times although today in our office it is normal.  We calibrated her home cuff against ours and it correlated well. Since last visit she has gained weight.  She has not yet getting any regular exercise.  She has had a problem with her right knee and is scheduled for arthroscopic surgery on her right knee by Dr. Rhona Raider on 12/02/13. He has not been on any recent chest pains. Other chronic medical conditions include benign essential tremor, insomnia, and bipolar illness.  She also has past history of breast cancer of left breast in 2012.  She is followed for that by Dr. Jana Hakim.  Current Outpatient Prescriptions  Medication Sig Dispense Refill  . ALPRAZolam (XANAX XR) 1 MG 24 hr tablet 2 tabs po bid  120 tablet  5  . asenapine (SAPHRIS) 5 MG SUBL Place 10 mg under the tongue at bedtime.       . divalproex (DEPAKOTE ER) 500 MG 24 hr tablet       . gabapentin (NEURONTIN) 300 MG capsule Take 1 capsule (300 mg total) by mouth at bedtime.  30 capsule  3  . hydrALAZINE (APRESOLINE) 25 MG tablet Take 1 tablet (25 mg total) by mouth 3 (three) times daily.  90 tablet  11  . isometheptene-acetaminophen-dichloralphenazone (MIDRIN) 65-325-100 MG capsule as needed.       [name] letrozole (FEMARA) 2.5 MG  tablet Take 1 tablet (2.5 mg total) by mouth daily.  90 tablet  6  . lisinopril (PRINIVIL,ZESTRIL) 20 MG tablet Take 1 tablet (20 mg total) by mouth 2 (two) times daily.      . pravastatin (PRAVACHOL) 20 MG tablet       . propranolol (INDERAL) 40 MG tablet Take 1 tablet (40 mg total) by mouth 2 (two) times daily.  60 tablet  11  . traZODone (DESYREL) 50 MG tablet Take 50 mg by mouth daily.       [name] zolpidem (AMBIEN) 10 MG tablet        No current facility-administered medications for this visit.    No Known Allergies  Patient Active Problem List   Diagnosis Date Noted  . DOE (dyspnea on exertion) 09/14/2013  . Abnormal EKG 09/14/2013  . Essential tremor 06/05/2012  . Abnormal involuntary movements(781.0) 06/05/2011  . 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 Bipolar disorder 01/30/2011  . Chemotherapy follow-up examination 12/28/2010  . Fatigue 12/28/2010  . Breast cancer, ILC, Left, receptor+, Her2- 10/10/2010    History  Smoking status  . Never Smoker   Smokeless tobacco  . Never Used    History  Alcohol Use  . 1.7 oz/week  . 2 Glasses of wine, 1 Drinks containing 0.5 oz of alcohol per week    Family History  Problem Relation  Age of Onset  . Cancer Maternal Aunt     breast, thyroid, colonm  . Heart failure Father   . Heart attack Sister   . Hypertension Mother   . Diabetes Mother     Review of Systems: The patient denies any heat or cold intolerance.  No weight gain or weight loss.  The patient denies headaches or blurry vision.  There is no cough or sputum production.  The patient denies dizziness.  There is no hematuria or hematochezia.  The patient denies any muscle aches or arthritis.  The patient denies any rash.  The patient denies frequent falling or instability.  There is no history of depression or anxiety.  All other systems were reviewed and are negative.   Physical Exam: Filed Vitals:   11/23/13 1043  BP: 116/74  Pulse: 54  The patient appears to be in  no distress.  Head and neck exam reveals that the pupils are equal and reactive.  The extraocular movements are full.  There is no scleral icterus.  Mouth and pharynx are benign.  No lymphadenopathy.  No carotid bruits.  The jugular venous pressure is normal.  Thyroid is not enlarged or tender.  Chest is clear to percussion and auscultation.  No rales or rhonchi.  Expansion of the chest is symmetrical.  Heart reveals no abnormal lift or heave.  First and second heart sounds are normal.  There is no murmur gallop rub or click.  The abdomen is soft and nontender.  Bowel sounds are normoactive.  There is no hepatosplenomegaly or mass.  There are no abdominal bruits.  Extremities reveal no phlebitis or edema.  Pedal pulses are good.  There is no cyanosis or clubbing.  Neurologic exam is normal strength and no lateralizing weakness.  No sensory deficits.  Integument reveals no rash     Assessment / Plan: 1. atypical chest pain.  No evidence of ischemia by Myoview.  Normal left ventricular function by Myoview and echo. 2. essential hypertension 3. bipolar illness 4. osteoarthritis right knee, pending arthroscopic surgery November 5  Disposition: The patient is to continue current medication.  Followup with PCP. Recheck here on an as needed basis.

## 2013-12-06 ENCOUNTER — Other Ambulatory Visit: Payer: Self-pay | Admitting: Adult Health

## 2013-12-07 ENCOUNTER — Other Ambulatory Visit: Payer: Self-pay | Admitting: *Deleted

## 2014-01-12 ENCOUNTER — Other Ambulatory Visit: Payer: Self-pay | Admitting: Nurse Practitioner

## 2014-02-01 ENCOUNTER — Telehealth: Payer: Self-pay | Admitting: Neurology

## 2014-02-03 ENCOUNTER — Telehealth: Payer: Self-pay | Admitting: Neurology

## 2014-02-03 NOTE — Telephone Encounter (Signed)
Rx signed and faxed.

## 2014-02-03 NOTE — Telephone Encounter (Signed)
Patient transferred to the sleep lab in error.  Appears that her insurance information had not been updated and rx for Xanax was denied.  I went ahead and added her new information of [facility] [employer] into her chart that she provided.  She is requesting a rush on getting medication approved because she says they are charging her about $300.  Advised her I would send rush notification to the pharmacy technician

## 2014-02-03 NOTE — Telephone Encounter (Signed)
Pt states the ALPRAZolam (XANAX XR) 1 MG 24 hr tablet needs prior auth in order for her insurance to cover it.  Please call and advise.

## 2014-02-03 NOTE — Telephone Encounter (Signed)
All info has been sent to ins.  Request is currently under review.  I spoke with the patient, she is aware.

## 2014-02-03 NOTE — Telephone Encounter (Signed)
I have contacted ins and provided all required clinical info.  Request is currently pending.  I called the patient back.  She is aware.

## 2014-02-04 ENCOUNTER — Telehealth: Payer: Self-pay | Admitting: Neurology

## 2014-02-04 NOTE — Telephone Encounter (Signed)
I called ins who asked that we fax the patient's clinical notes to the Grievance Dept at [phone].  I have done so.  Called the patient back to advise.  Got no answer.  Left message.

## 2014-02-04 NOTE — Telephone Encounter (Signed)
Pt is calling back to state that insurance denied the approval for ALPRAZolam (XANAX XR) 1 MG 24 hr tablet.  She said they need more information.  Please call and advise.

## 2014-02-04 NOTE — Telephone Encounter (Signed)
Patient calling back with # [phone], PA # [id number] for additional information needed.

## 2014-02-08 ENCOUNTER — Telehealth: Payer: Self-pay | Admitting: Neurology

## 2014-02-08 MED ORDER — ALPRAZOLAM 1 MG PO TABS: ORAL_TABLET | ORAL | Status: DC

## 2014-02-08 NOTE — Telephone Encounter (Signed)
Xanax XR is not formulary on patient's current ins plan.  Clinical info has been submitted requesting they grant an exception and cover this drug.  Patient would like to know if a Rx could be written for regular Xanax while the ins is reviewing this request.  Please advise.  Thank you.

## 2014-02-08 NOTE — Telephone Encounter (Signed)
I have called her, her insurance would not cover Xanax XR, it is in the process of appealing,   I have written Xanax 1 milligrams   2 tablets twice a day, 120 tablets for one month, with one refill  She needs to be Prescription February 09 2014

## 2014-02-08 NOTE — Telephone Encounter (Signed)
Patient is calling to get a Rx called in for regular Xanax until Xanax XR can be approved. [place] @ [employer]. Thank you.

## 2014-02-08 NOTE — Telephone Encounter (Signed)
I called the patient.  Got no answer.  Left message.

## 2014-02-10 NOTE — Telephone Encounter (Signed)
I called back.  Got no answer.  Left message.

## 2014-02-10 NOTE — Telephone Encounter (Signed)
Vanessa Mills with [facility] @ [phone], responding to appeal for Rx ALPRAZolam (XANAX XR) 1 MG 24 hr tablet.  Need clarification for diagnosis, quantity for 30 days, and directions for use.  Please call and advise.

## 2014-02-20 ENCOUNTER — Telehealth: Payer: Self-pay

## 2014-02-20 NOTE — Telephone Encounter (Signed)
[employer] notified us they have approved the request for coverage on Alprazolam XR effective until 01/28/2015 Ref # [id number] QM

## 2014-03-10 ENCOUNTER — Other Ambulatory Visit: Payer: Self-pay | Admitting: *Deleted

## 2014-03-10 ENCOUNTER — Other Ambulatory Visit (HOSPITAL_BASED_OUTPATIENT_CLINIC_OR_DEPARTMENT_OTHER): Payer: 59

## 2014-03-10 DIAGNOSIS — D72829 Elevated white blood cell count, unspecified: Secondary | ICD-10-CM

## 2014-03-10 DIAGNOSIS — C50419 Malignant neoplasm of upper-outer quadrant of unspecified female breast: Secondary | ICD-10-CM

## 2014-03-10 LAB — CBC WITH DIFFERENTIAL/PLATELET
BASO%: 0.8 % (ref 0.0–2.0)
Basophils Absolute: 0 10*3/uL (ref 0.0–0.1)
EOS ABS: 0.1 10*3/uL (ref 0.0–0.5)
EOS%: 1 % (ref 0.0–7.0)
HCT: 45.9 % (ref 34.8–46.6)
HEMOGLOBIN: 14.6 g/dL (ref 11.6–15.9)
LYMPH%: 26.7 % (ref 14.0–49.7)
MCH: 30.5 pg (ref 25.1–34.0)
MCHC: 31.7 g/dL (ref 31.5–36.0)
MCV: 96.2 fL (ref 79.5–101.0)
MONO#: 0.4 10*3/uL (ref 0.1–0.9)
MONO%: 8.5 % (ref 0.0–14.0)
NEUT%: 63 % (ref 38.4–76.8)
NEUTROS ABS: 3.3 10*3/uL (ref 1.5–6.5)
Platelets: 289 10*3/uL (ref 145–400)
RBC: 4.78 10*6/uL (ref 3.70–5.45)
RDW: 13.2 % (ref 11.2–14.5)
WBC: 5.2 10*3/uL (ref 3.9–10.3)
lymph#: 1.4 10*3/uL (ref 0.9–3.3)

## 2014-03-10 LAB — COMPREHENSIVE METABOLIC PANEL (CC13)
ALBUMIN: 4.1 g/dL (ref 3.5–5.0)
ALT: 16 U/L (ref 0–55)
ANION GAP: 12 meq/L — AB (ref 3–11)
AST: 17 U/L (ref 5–34)
Alkaline Phosphatase: 75 U/L (ref 40–150)
BUN: 12.5 mg/dL (ref 7.0–26.0)
CALCIUM: 9.6 mg/dL (ref 8.4–10.4)
CHLORIDE: 106 meq/L (ref 98–109)
CO2: 23 meq/L (ref 22–29)
Creatinine: 1 mg/dL (ref 0.6–1.1)
EGFR: 66 mL/min/{1.73_m2} — ABNORMAL LOW (ref >90)
Glucose: 98 mg/dl (ref 70–140)
Potassium: 4.1 mEq/L (ref 3.5–5.1)
Sodium: 141 mEq/L (ref 136–145)
Total Bilirubin: 0.52 mg/dL (ref 0.20–1.20)
Total Protein: 6.8 g/dL (ref 6.4–8.3)

## 2014-03-17 ENCOUNTER — Ambulatory Visit (HOSPITAL_BASED_OUTPATIENT_CLINIC_OR_DEPARTMENT_OTHER): Payer: 59 | Admitting: Oncology

## 2014-03-17 ENCOUNTER — Telehealth: Payer: Self-pay | Admitting: Oncology

## 2014-03-17 VITALS — BP 151/88 | HR 51 | Temp 98.6°F | Resp 18 | Ht 68.0 in | Wt 194.4 lb

## 2014-03-17 DIAGNOSIS — M8580 Other specified disorders of bone density and structure, unspecified site: Secondary | ICD-10-CM

## 2014-03-17 DIAGNOSIS — Z170 Estrogen receptor positive status [ER+]: Secondary | ICD-10-CM

## 2014-03-17 DIAGNOSIS — C50912 Malignant neoplasm of unspecified site of left female breast: Secondary | ICD-10-CM

## 2014-03-17 NOTE — Progress Notes (Signed)
Vanessa Mills  Telephone:([phone]) [phone] Fax:(336) [phone]     ID: [name] DOB: 08/09/1960  MR#: [id number]  GYK#:[id number]  Patient Care Team: Jonathon Bellows, MD as PCP - General (Family Medicine) Blair Promise, MD (Radiation Oncology) Chauncey Cruel, MD as Consulting Physician (Oncology) Darlin Coco, MD as Consulting Physician (Cardiology) Fannie Knee, MD as Consulting Physician (Otolaryngology) Rupinder Charm Rings, MD as Consulting Physician (Psychiatry) Dimas Aguas, MD as Referring Physician (Internal Medicine) Jeryl Columbia, MD as Consulting Physician (Gastroenterology) Marcial Pacas, MD as Consulting Physician (Neurology) PCP: Jonathon Bellows, MD GYN: SU:  OTHER MD:  CHIEF COMPLAINT: Locally advanced lobular breast cancer  CURRENT TREATMENT: Letrozole   BREAST CANCER HISTORY: From Vanessa. Cristina Mills 10/10/2010 intake note:  "Vanessa Mills is a 54 y.o. female. She found an area that she was worried about in the left breast a few weeks ago. She also noted some nipple inversion on left about 3 weeks ago. 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 MRI has shown a fairly large area of abnormality. There is no evidence of an axillary metastasis. She comes to the breast multidisciplinary clinic for evaluation. Her primary physician is Vanessa.Westerman."  On review of the prior records: On 10/02/2010 the patient underwent biopsy of the left breast mass in question, showing ([id number]) and invasive lobular carcinoma (E-cadherin negative), estrogen receptor 81% positive with strong staining intensity, progesterone receptor 12% positive with moderate staining intensity, with an MIB-1 of 12% and no HER-2 amplification, the signals ratio being 1.04.  On 10/23/2010 the patient underwent left simple mastectomy and left axillary  lymph node resection. The final pathology ([id number]) showed a 5 cm invasive lobular breast cancer (with some areas of ductal differentiation), grade 2, with 6 out of 11 lymph nodes involved,  Her subsequent treatment is as detailed below  INTERVAL HISTORY: Vanessa Mills returns today for follow-up of her breast cancer accompanied by her husband Vanessa Mills. She is establishing herself on my service today  REVIEW OF SYSTEMS: She is tolerating letrozole generally well. She does have some nighttime hot flashes and gabapentin at bedtime is helping. She worries about insomnia and weight gain and of course these are common menopausal symptoms not related to her letrozole treatments. She is not exercising regularly. She has some back and joint pain here and there but it is not more intense or persistent than before. She has rare headaches not accompanied by visual changes, nausea, vomiting, or gait imbalance. A detailed review of systems today was otherwise stable.  PAST MEDICAL HISTORY: Past Medical History  Diagnosis Date  . Chronic mental illness   . Meniere's syndrome   . Crohn's disease   . Raynaud's disease   . GERD (gastroesophageal reflux disease)     does not take medications for   . Headache(784.0)     takes midodrine for migraines prn  . Breast cancer, ILC, Left, receptor+, Her2- 10/10/2010  . Mental disorder     bipolar, takes saphris at hs  . Chemotherapy follow-up examination 12/28/2010  . Fatigue 12/28/2010  . Breast cancer 03/07/2011  . Bipolar 1 disorder   . S/P radiation therapy 05/15/11 - 07/01/11    Left Breast: 4500 cGy/25 fractions with Boost to Left chest Wall/Mastectomy Scar for Toal dose of 5940 cGy  . Status post chemotherapy  4 cycles AC  . Status post chemotherapy 01/31/11 - 04/18/11    Taxol x 12 weeks  . PONV (postoperative nausea and vomiting)   . Movement disorder     PAST SURGICAL HISTORY: Past Surgical History  Procedure Laterality Date  . Bladder tuck    . 2  orbital fracture surgeries    . Sinus exploration    . Bladder suspension      done 2005  . Mastectomy modified radical  10/23/2010    Left Vanessa Margot Chimes  . Portacath placement  11/28/2010    via right subclavian - Vanessa Margot Chimes  . Abdominal hysterectomy      uterus removed only  . Breast surgery      Axillary dissection  . Port-a-cath removal  08/21/2011    Procedure: REMOVAL PORT-A-CATH;  Surgeon: Haywood Lasso, MD;  Location: [facility];  Service: General;  Laterality: Right;    FAMILY HISTORY Family History  Problem Relation Age of Onset  . Cancer Maternal Aunt     breast, thyroid, colonm  . Heart failure Father   . Heart attack Sister   . Hypertension Mother   . Diabetes Mother    the patient's father died at the age of 31 with congestive heart failure. The patient's mother is living, 41 years old as of February 2016. Patient has 3 maternal aunts him a and 2 of them were diagnosed with breast cancer in their 72s and 26s. The patient herself has no brothers, 2 sisters. One sister committed suicide remotely. There is no other history of breast or ovarian cancer in the family.  GYNECOLOGIC HISTORY:  No LMP recorded. Patient has had a hysterectomy. Menarche age 41, first live birth age 37. The patient is GX P1. She underwent a simple hysterectomy without salpingo-oophorectomy remotely, with benign pathology. She did not take hormone replacement.  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 Her husband Vanessa Mills works in Press photographer currently 4 time [place]. Their daughter is 27 years old as of February 2016, lives in [place], and works for a Copywriter, advertising. The patient has no grandchildren. She attends the [employer].    ADVANCED DIRECTIVES: Not in place   HEALTH MAINTENANCE: History  Substance Use Topics  . Smoking status: Never Smoker   . Smokeless tobacco: Never Used  . Alcohol Use: 1.7 oz/week    2 Glasses of wine, 1 Standard drinks or  equivalent per week     Colonoscopy:  PAP:  Bone density:  Lipid panel:  No Known Allergies  Current Outpatient Prescriptions  Medication Sig Dispense Refill  . ALPRAZolam (XANAX XR) 1 MG 24 hr tablet TAKE 2 TABLETS BY MOUTH TWICE DAILY. 120 tablet 5  . ALPRAZolam (XANAX) 1 MG tablet 2 tabs po bid 120 tablet 1  . asenapine (SAPHRIS) 5 MG SUBL Place 10 mg under the tongue at bedtime.     . divalproex (DEPAKOTE ER) 500 MG 24 hr tablet     . gabapentin (NEURONTIN) 300 MG capsule TAKE ONE CAPSULE BY MOUTH AT BEDTIME 30 capsule 3  . hydrALAZINE (APRESOLINE) 25 MG tablet Take 1 tablet (25 mg total) by mouth 3 (three) times daily. 90 tablet 11  . isometheptene-acetaminophen-dichloralphenazone (MIDRIN) 65-325-100 MG capsule as needed.     [name] letrozole (FEMARA) 2.5 MG tablet Take 1 tablet (2.5 mg total) by mouth daily. 90 tablet 6  . lisinopril (PRINIVIL,ZESTRIL) 20 MG tablet Take 1 tablet (20 mg total) by mouth 2 (  two) times daily.    . pravastatin (PRAVACHOL) 20 MG tablet     . propranolol (INDERAL) 40 MG tablet Take 1 tablet (40 mg total) by mouth 2 (two) times daily. 60 tablet 11  . traZODone (DESYREL) 50 MG tablet Take 50 mg by mouth daily.     [name] zolpidem (AMBIEN) 10 MG tablet      No current facility-administered medications for this visit.    OBJECTIVE: Middle-aged white woman in no acute distress Filed Vitals:   03/17/14 1438  BP: 151/88  Pulse: 51  Temp: 98.6 F (37 C)  Resp: 18     Body mass index is 29.57 kg/(m^2).    ECOG FS:1 - Symptomatic but completely ambulatory  Ocular: Sclerae unicteric, pupils equal, round and reactive to light Ear-nose-throat: Oropharynx clear, dentition in good repair Lymphatic: No cervical or supraclavicular adenopathy Lungs no rales or rhonchi, good excursion bilaterally Heart regular rate and rhythm, no murmur appreciated Abd soft, nontender, positive bowel sounds MSK no focal spinal tenderness, no upper extremity lymphedema Neuro:  non-focal, well-oriented, appropriate affect Breasts: The right breast is unremarkable. The left breast is status post mastectomy and radiation. There is no evidence of local recurrence. The left axilla is benign.   LAB RESULTS:  CMP     Component Value Date/Time   NA 141 03/10/2014 1136   NA 137 09/07/2013 1319   K 4.1 03/10/2014 1136   K 3.9 09/07/2013 1319   CL 101 09/07/2013 1319   CL 104 03/05/2012 1005   CO2 23 03/10/2014 1136   CO2 23 09/07/2013 1319   GLUCOSE 98 03/10/2014 1136   GLUCOSE 135* 09/07/2013 1319   GLUCOSE 93 03/05/2012 1005   BUN 12.5 03/10/2014 1136   BUN 15 09/07/2013 1319   CREATININE 1.0 03/10/2014 1136   CREATININE 0.88 09/07/2013 1319   CALCIUM 9.6 03/10/2014 1136   CALCIUM 9.8 09/07/2013 1319   PROT 6.8 03/10/2014 1136   PROT 7.7 09/07/2013 1319   ALBUMIN 4.1 03/10/2014 1136   ALBUMIN 4.7 09/07/2013 1319   AST 17 03/10/2014 1136   AST 19 09/07/2013 1319   ALT 16 03/10/2014 1136   ALT 25 09/07/2013 1319   ALKPHOS 75 03/10/2014 1136   ALKPHOS 89 09/07/2013 1319   BILITOT 0.52 03/10/2014 1136   BILITOT 0.6 09/07/2013 1319   GFRNONAA >90 11/26/2010 1107   GFRAA >90 11/26/2010 1107    INo results found for: SPEP, UPEP  Lab Results  Component Value Date   WBC 5.2 03/10/2014   NEUTROABS 3.3 03/10/2014   HGB 14.6 03/10/2014   HCT 45.9 03/10/2014   MCV 96.2 03/10/2014   PLT 289 03/10/2014      Chemistry      Component Value Date/Time   NA 141 03/10/2014 1136   NA 137 09/07/2013 1319   K 4.1 03/10/2014 1136   K 3.9 09/07/2013 1319   CL 101 09/07/2013 1319   CL 104 03/05/2012 1005   CO2 23 03/10/2014 1136   CO2 23 09/07/2013 1319   BUN 12.5 03/10/2014 1136   BUN 15 09/07/2013 1319   CREATININE 1.0 03/10/2014 1136   CREATININE 0.88 09/07/2013 1319      Component Value Date/Time   CALCIUM 9.6 03/10/2014 1136   CALCIUM 9.8 09/07/2013 1319   ALKPHOS 75 03/10/2014 1136   ALKPHOS 89 09/07/2013 1319   AST 17 03/10/2014 1136   AST  19 09/07/2013 1319   ALT 16 03/10/2014 1136   ALT 25 09/07/2013 1319  BILITOT 0.52 03/10/2014 1136   BILITOT 0.6 09/07/2013 1319       Lab Results  Component Value Date   LABCA2 67* 10/10/2010    No components found for: DSKAJ681  No results for input(s): INR in the last 168 hours.  Urinalysis    Component Value Date/Time   COLORURINE AMBER BIOCHEMICALS MAY BE AFFECTED BY COLOR* 09/08/2008 1519   APPEARANCEUR CLOUDY* 09/08/2008 1519   LABSPEC 1.029 09/08/2008 1519   PHURINE 6.0 09/08/2008 1519   GLUCOSEU NEGATIVE 09/08/2008 1519   HGBUR TRACE* 09/08/2008 1519   BILIRUBINUR SMALL* 09/08/2008 1519   KETONESUR >80* 09/08/2008 1519   PROTEINUR 100* 09/08/2008 1519   UROBILINOGEN 0.2 09/08/2008 1519   NITRITE NEGATIVE 09/08/2008 1519   LEUKOCYTESUR NEGATIVE 09/08/2008 1519    STUDIES: Mammography shows breast density to be category D. DEXA scan 09/14/2014 shows a T score of -1.6.   ASSESSMENT: 54 y.o. [place] woman status post left mastectomy and axillary lymph node dissection 10/24/2010 for a T3 N2, stage IIIA invasive ductal carcinoma, estrogen and progesterone receptor positive, HER-2 negative, with an MIB-1 of 12%  (1) adjuvant chemotherapy consisted of cyclophosphamide and doxorubicin given in dose dense fashion 4, completed 01/10/2011, followed by paclitaxel weekly 12 completed 04/18/2011.  (2) adjuvant radiation completed 07/01/2011: Left chest wall high axilla and supraclavicular region 4500 cGy in 25 fractions. The left chest wall/mastectomy scar area was boosted further to a dose of 5940 cGy.  (3) letrozole started 07/29/2011  (a) osteopenia with a T score of -1.6 09/13/2013  PLAN: I spent approximately 55 minutes today with the patient and her husband going over her diagnosis and her treatment history. She understands she had a lobular breast cancer, and that these are a "minority" cancers. There is subtle differences between lobular son ductal cell  including the difficulty in detecting lobular's, which may be the reason her cancer was so advanced at the time it was found. She had optimal local treatment and for systemic therapy she had standard chemotherapy and is currently on letrozole.   The only change I would make in her treatment plan is that I find lobular breast cancers tend to recur late, so I generally treat them, if they are anything except stage I, for a total of 10 years. She is generally tolerating letrozole well, and I reassured her that weight gain and insomnia are not side effects of this medication.  We discussed the fact that aromatase inhibitors can worsen bone density problems. She will start vitamin D supplementation and start a walking program. We will repeat a bone density August 2017 and consider bisphosphonates or denosumab depending on results  She will see [place] again in 6 months, with physical exam and lab work, and she will see me again a year from now. After the first 5 years of follow-up we will start seeing her on a once a year basis for the final 5 years.   The patient has a good understanding of the overall plan. She agrees with it. She knows the goal of treatment in her case is cure. She will call with any problems that may develop before her next visit here.  Chauncey Cruel, MD   03/18/2014 12:40 PM Medical Oncology and Hematology [facility] [address] [place], [place] [address] Tel. [phone]    Fax. [phone]

## 2014-03-17 NOTE — Telephone Encounter (Signed)
per pof to sch appt-gave pt copy of sch °

## 2014-03-18 DIAGNOSIS — C50812 Malignant neoplasm of overlapping sites of left female breast: Secondary | ICD-10-CM | POA: Insufficient documentation

## 2014-03-18 DIAGNOSIS — Z170 Estrogen receptor positive status [ER+]: Secondary | ICD-10-CM

## 2014-03-18 NOTE — Addendum Note (Signed)
Addended by: Laureen Abrahams on: 03/18/2014 03:57 PM   Modules accepted: Orders, Medications

## 2014-04-15 ENCOUNTER — Other Ambulatory Visit: Payer: Self-pay | Admitting: *Deleted

## 2014-04-15 DIAGNOSIS — C50912 Malignant neoplasm of unspecified site of left female breast: Secondary | ICD-10-CM

## 2014-04-15 DIAGNOSIS — C50919 Malignant neoplasm of unspecified site of unspecified female breast: Secondary | ICD-10-CM

## 2014-04-15 MED ORDER — LETROZOLE 2.5 MG PO TABS: 2.5000 mg | ORAL_TABLET | Freq: Every day | ORAL | Status: DC

## 2014-04-23 ENCOUNTER — Other Ambulatory Visit: Payer: Self-pay | Admitting: Oncology

## 2014-05-02 ENCOUNTER — Other Ambulatory Visit: Payer: Self-pay | Admitting: *Deleted

## 2014-05-02 MED ORDER — GABAPENTIN 300 MG PO CAPS: 300.0000 mg | ORAL_CAPSULE | Freq: Every day | ORAL | Status: DC

## 2014-05-16 ENCOUNTER — Telehealth: Payer: Self-pay | Admitting: Neurology

## 2014-05-16 NOTE — Telephone Encounter (Signed)
Appt moved to earlier date.

## 2014-05-16 NOTE — Telephone Encounter (Signed)
Pt is calling back stating the Rx for Xanax from Dr. Justin Mend was a one time Rx.  Please call and advise.

## 2014-05-16 NOTE — Telephone Encounter (Signed)
Patient is calling and states that Dr. Krista Blue has cancelled her Rx Xanax HR 1 mg because she has another Rx Xanax 2 mg from Dr. Maurice Small.  Patient states she would like to keep the Xanax HR 1 mg and would gladly bring the other Xanax back to the pharmacy or whatever is necessary to keep Dr Rhea Belton Rx.  Please call and advise.

## 2014-05-16 NOTE — Telephone Encounter (Signed)
I have talked with her Pharmarcy,   She got too much Xanax prescription  Xanax 830m 2 tab bid,120 tab each month, Xanax ER 130m2 tab bid 120 tabs each month  In March, she also got Rx from Dr. We[facility] Motorsanax 30m230mR, 30 tabs, Xanax 30mg44m tabs,    [name] call patient to check on where she wants the Rx, if she decided to stay with Dr. Web,[facility] office will not write more Xanax, she should not get Xanax Rx from both office.  She has follow up appt with CaroHoyle SauerMay 2016.

## 2014-05-16 NOTE — Telephone Encounter (Signed)
I called the patient.  Got no answer.  Left message.

## 2014-05-16 NOTE — Telephone Encounter (Signed)
Patient says she would like to get Xanax Rx's from [place], and Dr Web prescribed meds one time only.

## 2014-05-16 NOTE — Telephone Encounter (Signed)
Move up her follow up appt with our office to discuss medications, it is ok to be with Hoyle Sauer

## 2014-05-17 ENCOUNTER — Encounter: Payer: Self-pay | Admitting: Neurology

## 2014-05-17 ENCOUNTER — Telehealth: Payer: Self-pay | Admitting: *Deleted

## 2014-05-17 ENCOUNTER — Ambulatory Visit (INDEPENDENT_AMBULATORY_CARE_PROVIDER_SITE_OTHER): Payer: Medicare Other | Admitting: Neurology

## 2014-05-17 VITALS — BP 146/91 | HR 59 | Ht 68.0 in | Wt 196.0 lb

## 2014-05-17 DIAGNOSIS — Z9229 Personal history of other drug therapy: Secondary | ICD-10-CM

## 2014-05-17 DIAGNOSIS — F3110 Bipolar disorder, current episode manic without psychotic features, unspecified: Secondary | ICD-10-CM

## 2014-05-17 DIAGNOSIS — G243 Spasmodic torticollis: Secondary | ICD-10-CM

## 2014-05-17 DIAGNOSIS — G250 Essential tremor: Secondary | ICD-10-CM

## 2014-05-17 NOTE — Telephone Encounter (Signed)
Dr. Krista Blue will no longer refill this patient's Xanax prescription.

## 2014-05-17 NOTE — Patient Instructions (Addendum)
Wean off xanax over 1 month period of time.  Xanax ER 38m 2 tab twice a day now,   You should take Xanax ER 161m one tab in the morning, 2 tabs at evening x one week,  Then one tab in the morning, one tab at evening xone week,  Then Zero tab in the morning, one tab at evening xone week,   Then stop

## 2014-05-17 NOTE — Telephone Encounter (Signed)
Patient has an appointment 05/17/14.

## 2014-05-17 NOTE — Progress Notes (Signed)
[facility]  PATIENT: Vanessa Mills DOB: July 24, 1960  HISTORY OF PRESENT ILLNESS: Vanessa Mills, 54 year old female returns for followup for essential tremor, cervical dystonia.  Her hand tremor and head titubation first occurred in 2004, originally seen by Dr. Doy Mills and treated with propanolol which has been very beneficial.   She also takes Xanax. She also received Botox for her cervical dystonia and head titubation since 2006,  however she stopped the Botox in 2012, because she felt it was no longer beneficial. She also has a history of breast cancer, had left lobectomy in Sep 2012, followed by chemo/radiation therapy, bipolar disorder since 2008,  treated by psychiatrist Dr. Toy Mills, is taking Saphris 59m 2 tabs qday, it was changed from resperidone in the the past, which was not effective.   There was some questions about her prescription, this was triggered by the phone call from pharmacy from Vanessa Mills 19th 2015  "We have been prescribing Xanax 273mBID for the patient.  Apparently Dr KaToy Cares prescribing Klonopin 5m26maily at bedtime and Valium 19m8mD.  [facility] Dr Vanessa Mills,Vanessa Mills would like to know if she still wants them to continue filling Xanax.  Please advise."  I have cancelled her xanax prescription then, she came in urgently, wanted to have her Xanax refilled  I have reviewed her Xanax refill history of from pharmacy from 2014, she was getting Xanax ER 1 mg 120 tablets each month, I also contact her psychologist Dr. KaurToy Mills, relayed information, the patient stated that Xanax was very helpful for her head tremor, "definitely needed it per patient".  After long discussion with patient and her husband, we decided to continue to refill her xanax er 1mg 71mbs po bid, and propranolol 40mg 21m   UPDATE April 19th 2016: This visit is triggered by phone call from her pharmacist May 16 2014, reporting, the patient is  getting multiple prescription of Xanax through different provider, Patient stated, she continue taking Xanax, since March, also receive extra Xanax prescription from her primary Mills Dr. Webb fJustin Mills  REVIEW OF SYSTEMS: Full 14 system review of systems performed and notable only for those listed, all others are neg:  Head shaking, mood disorder.   ALLERGIES: No Known Allergies  HOME MEDICATIONS: Outpatient Prescriptions Prior to Visit  Medication Sig Dispense Refill  . ALPRAZolam (XANAX XR) 1 MG 24 hr tablet TAKE 2 TABLETS BY MOUTH TWICE DAILY. 120 tablet 5  . ALPRAZolam (XANAX) 1 MG tablet 2 tabs po bid 120 tablet 1  . asenapine (SAPHRIS) 5 MG SUBL Place 10 mg under the tongue at bedtime.     . gabapentin (NEURONTIN) 300 MG capsule Take 1 capsule (300 mg total) by mouth at bedtime. 30 capsule 3  . hydrALAZINE (APRESOLINE) 25 MG tablet Take 1 tablet (25 mg total) by mouth 3 (three) times daily. 90 tablet 11  . isometheptene-acetaminophen-dichloralphenazone (MIDRIN) 65-325-100 MG capsule as needed.     . letrMarland Kitchenzole (FEMARA) 2.5 MG tablet TAKE 1 TABLET BY MOUTH DAILY 90 tablet 0  . lisinopril (PRINIVIL,ZESTRIL) 20 MG tablet Take 1 tablet (20 mg total) by mouth 2 (two) times daily.    . pravastatin (PRAVACHOL) 20 MG tablet     . propranolol (INDERAL) 40 MG tablet Take 1 tablet (40 mg total) by mouth 2 (two) times daily. 60 tablet 11  . traZODone (DESYREL) 50 MG tablet Take 50 mg by mouth daily.     .[name]  zolpidem (AMBIEN) 10 MG tablet     . letrozole (FEMARA) 2.5 MG tablet Take 1 tablet (2.5 mg total) by mouth daily. 90 tablet 3   No facility-administered medications prior to visit.    PAST MEDICAL HISTORY: Past Medical History  Diagnosis Date  . Chronic mental illness   . Meniere's syndrome   . Crohn's disease   . Raynaud's disease   . GERD (gastroesophageal reflux disease)     does not take medications for   . Headache(784.0)     takes midodrine for migraines prn  . Breast  cancer, ILC, Left, receptor+, Her2- 10/10/2010  . Mental disorder     bipolar, takes saphris at hs  . Chemotherapy follow-up examination 12/28/2010  . Fatigue 12/28/2010  . Breast cancer 03/07/2011  . Bipolar 1 disorder   . S/P radiation therapy 05/15/11 - 07/01/11    Left Breast: 4500 cGy/25 fractions with Boost to Left chest Wall/Mastectomy Scar for Toal dose of 5940 cGy  . Status post chemotherapy     4 cycles AC  . Status post chemotherapy 01/31/11 - 04/18/11    Taxol x 12 weeks  . PONV (postoperative nausea and vomiting)   . Movement disorder   Stage 3 kidney disease  PAST SURGICAL HISTORY: Past Surgical History  Procedure Laterality Date  . Bladder tuck    . 2 orbital fracture surgeries    . Sinus exploration    . Bladder suspension      done 2005  . Mastectomy modified radical  10/23/2010    Left Dr Vanessa Mills  . Portacath placement  11/28/2010    via right subclavian - Dr Vanessa Mills  . Abdominal hysterectomy      uterus removed only  . Breast surgery      Axillary dissection  . Port-a-cath removal  08/21/2011    Procedure: REMOVAL PORT-A-CATH;  Surgeon: Vanessa Lasso, MD;  Location: [facility];  Service: General;  Laterality: Right;    FAMILY HISTORY: Family History  Problem Relation Age of Onset  . Cancer Maternal Aunt     breast, thyroid, colonm  . Heart failure Father   . Heart attack Sister   . Hypertension Mother   . Diabetes Mother     SOCIAL HISTORY: History   Social History  . Marital Status: Married    Spouse Name: Vanessa Mills   . Number of Children: 1  . Years of Education: Masters   Occupational History  . Not on file.   Social History Main Topics  . Smoking status: Never Smoker   . Smokeless tobacco: Never Used  . Alcohol Use: 1.7 oz/week    2 Glasses of wine, 1 Standard drinks or equivalent per week  . Drug Use: No     Comment: in college  . Sexual Activity: Yes     Comment: erpr+/HER-2 neg.   Other Topics Concern  . Not on file    Social History Narrative   Married to Vanessa Mills   One dtr- 54 years old- Vanessa Mills Comment- in [place]   Patient has a Masters   Patient  has 1 child.      PHYSICAL EXAM  Filed Vitals:   05/17/14 1517  BP: 146/91  Pulse: 59  Height: _0  (1.727 m)  Weight: 196 lb (88.905 kg)   Body mass index is 29.81 kg/(m^2).  Generalized: Anxious looking middle-age female Head: normocephalic and atraumatic,. Oropharynx benign , mild tremor of the head Neck: Supple, no carotid bruits  Cardiac:  Regular rate rhythm, no murmur  Musculoskeletal: No deformity   Neurological examination   Mentation: Alert oriented to time, place, history taking. Follows all commands speech and language fluent, only mild head shaking.  Cranial nerve II-XII: Pupils were equal round reactive to light extraocular movements were full, visual field were full on confrontational test. Facial sensation and strength were normal. hearing was intact to finger rubbing bilaterally. Uvula tongue midline. head turning and shoulder shrug were normal and symmetric.Tongue protrusion into cheek strength was normal. Motor: normal bulk and tone, full strength in the BUE, BLE, fine finger movements normal, no pronator drift. No focal weakness Sensory: normal and symmetric to light touch Coordination: finger-nose-finger, heel-to-shin bilaterally, no dysmetria Reflexes: 1+ upper lower and symetric,  plantar responses were neutral Gait and Station: Rising up from seated position without assistance, normal stance,  moderate stride, good arm swing, smooth turning.   DIAGNOSTIC DATA (LABS, IMAGING, TESTING) - I reviewed patient records, labs, notes, testing and imaging myself where available.  Lab Results  Component Value Date   WBC 5.2 03/10/2014   HGB 14.6 03/10/2014   HCT 45.9 03/10/2014   MCV 96.2 03/10/2014   PLT 289 03/10/2014      Component Value Date/Time   NA 141 03/10/2014 1136   NA 137 09/07/2013 1319   K 4.1 03/10/2014 1136    K 3.9 09/07/2013 1319   CL 101 09/07/2013 1319   CL 104 03/05/2012 1005   CO2 23 03/10/2014 1136   CO2 23 09/07/2013 1319   GLUCOSE 98 03/10/2014 1136   GLUCOSE 135* 09/07/2013 1319   GLUCOSE 93 03/05/2012 1005   BUN 12.5 03/10/2014 1136   BUN 15 09/07/2013 1319   CREATININE 1.0 03/10/2014 1136   CREATININE 0.88 09/07/2013 1319   CALCIUM 9.6 03/10/2014 1136   CALCIUM 9.8 09/07/2013 1319   PROT 6.8 03/10/2014 1136   PROT 7.7 09/07/2013 1319   ALBUMIN 4.1 03/10/2014 1136   ALBUMIN 4.7 09/07/2013 1319   AST 17 03/10/2014 1136   AST 19 09/07/2013 1319   ALT 16 03/10/2014 1136   ALT 25 09/07/2013 1319   ALKPHOS 75 03/10/2014 1136   ALKPHOS 89 09/07/2013 1319   BILITOT 0.52 03/10/2014 1136   BILITOT 0.6 09/07/2013 1319   GFRNONAA >90 11/26/2010 1107   GFRAA >90 11/26/2010 1107   Lab Results  Component Value Date   CHOL 214* 03/04/2013   HDL 65 03/04/2013   LDLCALC 111* 03/04/2013   TRIG 189* 03/04/2013   CHOLHDL 3.3 03/04/2013   ASSESSMENT AND PLAN  54 y.o. year old female  has a past medical history of Bipolar 1 disorder; Movement disorder, essential tremor and history of cervical dystonia. Patient has refused further Botox treatments, was not helping her head tremor.   Continue propranolol 40 mg twice daily  I have reviewed her [place] query report,   She has refilled  Alprazolam ER 1 mg 120 tablets in April 13 2014, alprazolam 1 mg, 120 tablets in April 11 2014,  alprazolam ER 2 mg 30 tablets in April 09 2014,  alprazolam 2 mg 30 tablets in  April 09 2014,  Ambien 10 mg 30 tablets in April 09 2014,  alprazolam ER 1 mg    120  tab in March 17 2014   alprazolam 1 mg 120 tablets in , February 12,  alprazolam ER 1 mg 120 tablets, , February 14 2014  alprazolam 1 mg 120 tablets February 09 2014     alprazolam  ER 1 mg 120 tablets, January 07 2014   alprazolamER1 mg 120 tablets, December 08 2013  She also has refilled   hydrocodone/Tylenol  5/325 mg 30 tablets December 12 2013, 40 tablets December 14 2013, 40 tablets December 22 2013, 40 tablets December 31 2013, January 31 2014, 40 tablets, 40 tablets in March 11 2014  Patient is on polypharmacy treatment, has a tendency of overusing benzodiazepine, it is no longer good option for the treatment of her tremor  I have emphasized with her the potential danger of withdrawing  from benzodiazepine and overdose of benzodiazepine.  I have suggested her tapering off alprazolam,  I also talked with her primary [place] physician Dr. Gwenyth Bender has prescribed alprazolam 2 mg for her insomnia,   patient did not disclose that she is already on alprazolam ER 1 mg twice a day for her head tremor, also taking Ambien 10 mg daily  Schedule of how to tapering off alprazolam over 4 weeks course was written for patient  60 minutes was spent in coordinating patient's [place], more than 50% was in face-to-face consultation  [facility] [address] [place], [place] [address] [phone]

## 2014-05-19 LAB — DRUG SCREEN 10 W/CONF, SERUM

## 2014-05-20 DIAGNOSIS — Z9229 Personal history of other drug therapy: Secondary | ICD-10-CM | POA: Insufficient documentation

## 2014-05-26 ENCOUNTER — Telehealth: Payer: Self-pay | Admitting: Neurology

## 2014-05-26 NOTE — Telephone Encounter (Signed)
Patient called wanting to speak with Dr. Jannifer Franklin regarding the blood work she has last week. Please call and advice # [phone]

## 2014-05-27 NOTE — Telephone Encounter (Signed)
Vanessa Mills will call the patient.

## 2014-05-27 NOTE — Telephone Encounter (Signed)
Spoke to patient - she was upset because a blood drug screen was ordered by Dr. Krista Blue.  The patient thought she was checking liver and kidney tests.  I explained to her that the doctor was concerned about Xanax abuse.  Since Xanax is metabolized in the liver and excreted by the kidneys, she checked for the drug in her system.  She verbalized understanding but still requested to speak to our office manager.

## 2014-05-28 LAB — BLOOD DRUGS OF ABUSE 10 W/ALC
Amphetamines xxx Ql: NEGATIVE
Barbiturate Scrn: NEGATIVE
Benzodiaz xxx Ql: POSITIVE
CANNABINOIDS SERPLBLD QL SCN: NEGATIVE
Cocaine+Bze SerPlBld Ql Scn: NEGATIVE
Ethyl Alcohol: NEGATIVE gm/dL
METHADONE XXX QL: NEGATIVE
OPIATES: NEGATIVE
OXYCODONE XXX QL: NEGATIVE
PCP xxx Ql: NEGATIVE
Propoxyph xxx Ql: NEGATIVE

## 2014-05-28 LAB — BENZODIAZEPINE, LC/MS/MS CONF.
7-Aminoclonazepam: NEGATIVE ng/mL
Alprazolam (Xanax): 43.5 ng/mL — ABNORMAL HIGH (ref 5–25)
CHLORDIAZEPOXIDE + DESMETHYLC: NEGATIVE ng/mL (ref 400–3000)
CHLORDIAZEPOXIDE: NEGATIVE ng/mL
Clonazepam (Klonopin): NEGATIVE ng/mL (ref 20–70)
DESALKYLFLURAZEPAM: NEGATIVE ng/mL (ref 30–150)
DIAZEPAM + DESMETHYLDIAZEPAM: NEGATIVE ng/mL (ref 200–2500)
Desmethylchlordiazepoxide: NEGATIVE ng/mL
Desmethyldiazepam: NEGATIVE ng/mL
Diazepam: NEGATIVE ng/mL
FLURAZEPAM +DESALKYLFLURAZEPAM: NEGATIVE ng/mL (ref 30–180)
FLURAZEPAM: NEGATIVE ng/mL (ref 0–30)
Lorazepam (Ativan): NEGATIVE ng/mL (ref 50.0–240.0)
MIDAZOLAM (VERSED): NEGATIVE ng/mL (ref 50–600)
OXAZEPAM: NEGATIVE ng/mL (ref 200–500)
Temazepam (Restoril): NEGATIVE ng/mL (ref 50–1000)
Triazolam (Halcion): NEGATIVE ng/mL (ref 5.0–20.0)

## 2014-05-28 LAB — SPECIMEN STATUS REPORT

## 2014-05-31 ENCOUNTER — Telehealth: Payer: Self-pay | Admitting: Neurology

## 2014-05-31 NOTE — Telephone Encounter (Signed)
Blood drug screening test was only positive for xanax in April 2016.

## 2014-06-03 ENCOUNTER — Other Ambulatory Visit: Payer: Self-pay | Admitting: Oncology

## 2014-06-03 ENCOUNTER — Encounter: Payer: Self-pay | Admitting: Neurology

## 2014-06-03 ENCOUNTER — Telehealth: Payer: Self-pay | Admitting: Neurology

## 2014-06-03 MED ORDER — LETROZOLE 2.5 MG PO TABS: 2.5000 mg | ORAL_TABLET | Freq: Every day | ORAL | Status: DC

## 2014-06-03 NOTE — Telephone Encounter (Signed)
I have called her husband with my office manager Cira Servant, our office has dismissed the patient, multiple refills of xanax over short period of time detailed in [place] [facility]  Options were offered, including continued follow up by local or academic neurology practice.

## 2014-06-13 ENCOUNTER — Ambulatory Visit: Payer: Medicare Other | Admitting: Nurse Practitioner

## 2014-06-29 ENCOUNTER — Other Ambulatory Visit: Payer: Self-pay | Admitting: Nurse Practitioner

## 2014-06-29 ENCOUNTER — Telehealth: Payer: Self-pay

## 2014-06-29 NOTE — Telephone Encounter (Signed)
[facility] is requesting a refill on Propranolol.  Patient was dismissed from the practice on 05/06.  Would you like to auth one final refill?  Please advise.  Thank you.

## 2014-06-29 NOTE — Telephone Encounter (Signed)
Final refill has been sent, noting future refills should be requested from new provider.

## 2014-06-29 NOTE — Telephone Encounter (Signed)
Yes, one final refill on propranolol

## 2014-08-15 ENCOUNTER — Ambulatory Visit: Payer: Medicare Other | Admitting: Nurse Practitioner

## 2014-08-21 ENCOUNTER — Other Ambulatory Visit: Payer: Self-pay | Admitting: Oncology

## 2014-08-21 ENCOUNTER — Other Ambulatory Visit: Payer: Self-pay | Admitting: Neurology

## 2014-08-22 NOTE — Telephone Encounter (Signed)
lst ov 03/17/14.  Next ov 09/20/14.  Chart reviewed.

## 2014-09-13 ENCOUNTER — Other Ambulatory Visit (HOSPITAL_BASED_OUTPATIENT_CLINIC_OR_DEPARTMENT_OTHER): Payer: Medicare Other

## 2014-09-13 DIAGNOSIS — C50912 Malignant neoplasm of unspecified site of left female breast: Secondary | ICD-10-CM

## 2014-09-13 LAB — COMPREHENSIVE METABOLIC PANEL (CC13)
ALBUMIN: 4.1 g/dL (ref 3.5–5.0)
ALT: 13 U/L (ref 0–55)
AST: 13 U/L (ref 5–34)
Alkaline Phosphatase: 69 U/L (ref 40–150)
Anion Gap: 9 mEq/L (ref 3–11)
BUN: 6.3 mg/dL — AB (ref 7.0–26.0)
CHLORIDE: 110 meq/L — AB (ref 98–109)
CO2: 26 mEq/L (ref 22–29)
CREATININE: 0.9 mg/dL (ref 0.6–1.1)
Calcium: 9.8 mg/dL (ref 8.4–10.4)
EGFR: 75 mL/min/{1.73_m2} — ABNORMAL LOW (ref >90)
Glucose: 98 mg/dl (ref 70–140)
Potassium: 3.9 mEq/L (ref 3.5–5.1)
Sodium: 144 mEq/L (ref 136–145)
Total Bilirubin: 0.44 mg/dL (ref 0.20–1.20)
Total Protein: 6.6 g/dL (ref 6.4–8.3)

## 2014-09-13 LAB — CBC WITH DIFFERENTIAL/PLATELET
BASO%: 0.6 % (ref 0.0–2.0)
Basophils Absolute: 0 10*3/uL (ref 0.0–0.1)
EOS%: 0.4 % (ref 0.0–7.0)
Eosinophils Absolute: 0 10*3/uL (ref 0.0–0.5)
HEMATOCRIT: 43.8 % (ref 34.8–46.6)
HEMOGLOBIN: 14.5 g/dL (ref 11.6–15.9)
LYMPH#: 1.2 10*3/uL (ref 0.9–3.3)
LYMPH%: 22 % (ref 14.0–49.7)
MCH: 31.6 pg (ref 25.1–34.0)
MCHC: 33.1 g/dL (ref 31.5–36.0)
MCV: 95.5 fL (ref 79.5–101.0)
MONO#: 0.4 10*3/uL (ref 0.1–0.9)
MONO%: 6.8 % (ref 0.0–14.0)
NEUT#: 3.9 10*3/uL (ref 1.5–6.5)
NEUT%: 70.2 % (ref 38.4–76.8)
Platelets: 311 10*3/uL (ref 145–400)
RBC: 4.59 10*6/uL (ref 3.70–5.45)
RDW: 12.9 % (ref 11.2–14.5)
WBC: 5.5 10*3/uL (ref 3.9–10.3)

## 2014-09-16 ENCOUNTER — Other Ambulatory Visit: Payer: Self-pay | Admitting: Oncology

## 2014-09-20 ENCOUNTER — Telehealth: Payer: Self-pay | Admitting: Oncology

## 2014-09-20 ENCOUNTER — Other Ambulatory Visit: Payer: Self-pay | Admitting: *Deleted

## 2014-09-20 ENCOUNTER — Ambulatory Visit (HOSPITAL_BASED_OUTPATIENT_CLINIC_OR_DEPARTMENT_OTHER): Payer: Medicare Other | Admitting: Nurse Practitioner

## 2014-09-20 ENCOUNTER — Encounter: Payer: Self-pay | Admitting: Nurse Practitioner

## 2014-09-20 ENCOUNTER — Telehealth: Payer: Self-pay | Admitting: *Deleted

## 2014-09-20 VITALS — BP 122/69 | HR 80 | Temp 98.2°F | Resp 18 | Ht 68.0 in | Wt 184.5 lb

## 2014-09-20 DIAGNOSIS — M8580 Other specified disorders of bone density and structure, unspecified site: Secondary | ICD-10-CM

## 2014-09-20 DIAGNOSIS — Z170 Estrogen receptor positive status [ER+]: Secondary | ICD-10-CM | POA: Diagnosis not present

## 2014-09-20 DIAGNOSIS — R232 Flushing: Secondary | ICD-10-CM | POA: Insufficient documentation

## 2014-09-20 DIAGNOSIS — C50912 Malignant neoplasm of unspecified site of left female breast: Secondary | ICD-10-CM | POA: Diagnosis not present

## 2014-09-20 DIAGNOSIS — C773 Secondary and unspecified malignant neoplasm of axilla and upper limb lymph nodes: Secondary | ICD-10-CM | POA: Diagnosis not present

## 2014-09-20 DIAGNOSIS — F319 Bipolar disorder, unspecified: Secondary | ICD-10-CM

## 2014-09-20 DIAGNOSIS — N951 Menopausal and female climacteric states: Secondary | ICD-10-CM

## 2014-09-20 DIAGNOSIS — T50905A Adverse effect of unspecified drugs, medicaments and biological substances, initial encounter: Secondary | ICD-10-CM | POA: Insufficient documentation

## 2014-09-20 MED ORDER — GABAPENTIN 600 MG PO TABS: 600.0000 mg | ORAL_TABLET | Freq: Every day | ORAL | Status: DC

## 2014-09-20 NOTE — Progress Notes (Signed)
[place]  Telephone:([phone]) [phone] Fax:(336) [phone]     ID: Vanessa Mills DOB: 1960/07/01  MR#: [id number]  JQB#:[id number]  Patient Care Team: Vanessa Small, MD as PCP - General (Family Medicine) Vanessa Pray, MD (Radiation Oncology) Vanessa Cruel, MD as Consulting Physician (Oncology) Vanessa Coco, MD as Consulting Physician (Cardiology) Vanessa Mutters, MD as Consulting Physician (Otolaryngology) Vanessa May, MD as Consulting Physician (Psychiatry) Vanessa Aguas, MD as Referring Physician (Internal Medicine) Vanessa Essex, MD as Consulting Physician (Gastroenterology) Vanessa Pacas, MD as Consulting Physician (Neurology) PCP: Vanessa Bellows, MD GYN: SU:  OTHER MD:  CHIEF COMPLAINT: Locally advanced lobular breast cancer  CURRENT TREATMENT: Letrozole   BREAST CANCER HISTORY: From Dr. Cristina Mills 10/10/2010 intake note:  "Vanessa Mills is a 54 y.o. female. She found an area that she was worried about in the left breast a few weeks ago. She also noted some nipple inversion on left about 3 weeks ago. 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 MRI has shown a fairly large area of abnormality. There is no evidence of an axillary metastasis. She comes to the breast multidisciplinary clinic for evaluation. Her primary physician is Dr.Westerman."  On review of the prior records: On 10/02/2010 the patient underwent biopsy of the left breast mass in question, showing ([id number]) and invasive lobular carcinoma (E-cadherin negative), estrogen receptor 81% positive with strong staining intensity, progesterone receptor 12% positive with moderate staining intensity, with an MIB-1 of 12% and no HER-2 amplification, the signals ratio being 1.04.  On 10/23/2010 the patient underwent left simple mastectomy and left axillary lymph node  resection. The final pathology ([id number]) showed a 5 cm invasive lobular breast cancer (with some areas of ductal differentiation), grade 2, with 6 out of 11 lymph nodes involved,  Her subsequent treatment is as detailed below  INTERVAL HISTORY: Vanessa Mills returns today for follow-up of her breast cancer, alone. She has been on letrozole since July 2013 and is tolerating this drug reasonably well. She is on gabapentin 355m QHS for her hot flashes, but believes there could be room for improvement. She wonders if this dose could be increased. Otherwise she denies vaginal changes or arthralgias/myalgias.   REVIEW OF SYSTEMS: Vanessa Mills fevers, chills, nausea or vomiting. She has chronic diarrhea because of her history of Chron's disease. She is on prednisone for that now, but she has not gained any weight so far or had any other trouble with this steroid. She does not exercise regularly, though she knows she should. She denies chronic pain, just aches here and there on occasion. She denies headaches, dizziness, or vision changes. She has no shortness of breath, chest pain, cough, or palpitations. She has a history of bipolar disorder and is on asenapine and xanax daily for this. Her mood is appropriate during our visit today. A detailed review of systems is otherwise stable.  PAST MEDICAL HISTORY: Past Medical History  Diagnosis Date  . Chronic mental illness   . Meniere's syndrome   . Crohn's disease   . Raynaud's disease   . GERD (gastroesophageal reflux disease)     does not take medications for   . Headache(784.0)     takes midodrine for migraines prn  . Breast cancer, ILC, Left, receptor+, Her2- 10/10/2010  . Mental disorder     bipolar, takes saphris at hs  .  Chemotherapy follow-up examination 12/28/2010  . Fatigue 12/28/2010  . Breast cancer 03/07/2011  . Bipolar 1 disorder   . S/P radiation therapy 05/15/11 - 07/01/11    Left Breast: 4500 cGy/25 fractions with Boost to Left chest  Wall/Mastectomy Scar for Toal dose of 5940 cGy  . Status post chemotherapy     4 cycles AC  . Status post chemotherapy 01/31/11 - 04/18/11    Taxol x 12 weeks  . PONV (postoperative nausea and vomiting)   . Movement disorder     PAST SURGICAL HISTORY: Past Surgical History  Procedure Laterality Date  . Bladder tuck    . 2 orbital fracture surgeries    . Sinus exploration    . Bladder suspension      done 2005  . Mastectomy modified radical  10/23/2010    Left Dr Margot Chimes  . Portacath placement  11/28/2010    via right subclavian - Dr Margot Chimes  . Abdominal hysterectomy      uterus removed only  . Breast surgery      Axillary dissection  . Port-a-cath removal  08/21/2011    Procedure: REMOVAL PORT-A-CATH;  Surgeon: Haywood Lasso, MD;  Location: [facility];  Service: General;  Laterality: Right;    FAMILY HISTORY Family History  Problem Relation Age of Onset  . Cancer Maternal Aunt     breast, thyroid, colonm  . Heart failure Father   . Heart attack Sister   . Hypertension Mother   . Diabetes Mother    the patient's father died at the age of 6 with congestive heart failure. The patient's mother is living, 82 years old as of February 2016. Patient has 3 maternal aunts him a and 2 of them were diagnosed with breast cancer in their 12s and 79s. The patient herself has no brothers, 2 sisters. One sister committed suicide remotely. There is no other history of breast or ovarian cancer in the family.  GYNECOLOGIC HISTORY:  No LMP recorded. Patient has had a hysterectomy. Menarche age 39, first live birth age 35. The patient is GX P1. She underwent a simple hysterectomy without salpingo-oophorectomy remotely, with benign pathology. She did not take hormone replacement.  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 Her husband Linna Hoff works in Press photographer currently 4 time [place]. Their daughter is 1 years old as of February 2016, lives in [place], and works  for a Copywriter, advertising. The patient has no grandchildren. She attends the [employer].    ADVANCED DIRECTIVES: Not in place   HEALTH MAINTENANCE: Social History  Substance Use Topics  . Smoking status: Never Smoker   . Smokeless tobacco: Never Used  . Alcohol Use: 1.7 oz/week    2 Glasses of wine, 1 Standard drinks or equivalent per week     Colonoscopy:  PAP:  Bone density:  Lipid panel:  No Known Allergies  Current Outpatient Prescriptions  Medication Sig Dispense Refill  . ALPRAZolam (XANAX XR) 1 MG 24 hr tablet TAKE 2 TABLETS BY MOUTH TWICE DAILY. 120 tablet 5  . asenapine (SAPHRIS) 5 MG SUBL Place 10 mg under the tongue at bedtime.     . gabapentin (NEURONTIN) 600 MG tablet Take 1 tablet (600 mg total) by mouth at bedtime. 90 tablet 3  . hydrALAZINE (APRESOLINE) 25 MG tablet Take 1 tablet (25 mg total) by mouth 3 (three) times daily. 90 tablet 11  . isometheptene-acetaminophen-dichloralphenazone (MIDRIN) 65-325-100 MG capsule as needed.     [name]  letrozole (FEMARA) 2.5 MG tablet Take 1 tablet (2.5 mg total) by mouth daily. 90 tablet 2  . lisinopril (PRINIVIL,ZESTRIL) 20 MG tablet Take 1 tablet (20 mg total) by mouth 2 (two) times daily.    . pravastatin (PRAVACHOL) 20 MG tablet Take 20 mg by mouth at bedtime.     . predniSONE (DELTASONE) 10 MG tablet Take 10 mg by mouth daily with breakfast.    . traZODone (DESYREL) 50 MG tablet Take 50 mg by mouth daily.     [name] zolpidem (AMBIEN) 10 MG tablet Take 10 mg by mouth at bedtime.      No current facility-administered medications for this visit.    OBJECTIVE: Middle-aged white woman in no acute distress Filed Vitals:   09/20/14 1213  BP: 122/69  Pulse: 80  Temp: 98.2 F (36.8 C)  Resp: 18     Body mass index is 28.06 kg/(m^2).    ECOG FS:1 - Symptomatic but completely ambulatory  Skin: warm, dry  HEENT: sclerae anicteric, conjunctivae pink, oropharynx clear. No thrush or mucositis.  Lymph Nodes: No cervical or  supraclavicular lymphadenopathy  Lungs: clear to auscultation bilaterally, no rales, wheezes, or rhonci  Heart: regular rate and rhythm  Abdomen: round, soft, non tender, positive bowel sounds  Musculoskeletal: No focal spinal tenderness, no peripheral edema  Neuro: non focal, well oriented, positive affect  Breasts: left breast status post mastectomy and radiation. No evidence of recurrent disease. Left axilla benign. Right breast unremarkable.   LAB RESULTS:  CMP     Component Value Date/Time   NA 144 09/13/2014 1228   NA 137 09/07/2013 1319   K 3.9 09/13/2014 1228   K 3.9 09/07/2013 1319   CL 101 09/07/2013 1319   CL 104 03/05/2012 1005   CO2 26 09/13/2014 1228   CO2 23 09/07/2013 1319   GLUCOSE 98 09/13/2014 1228   GLUCOSE 135* 09/07/2013 1319   GLUCOSE 93 03/05/2012 1005   BUN 6.3* 09/13/2014 1228   BUN 15 09/07/2013 1319   CREATININE 0.9 09/13/2014 1228   CREATININE 0.88 09/07/2013 1319   CALCIUM 9.8 09/13/2014 1228   CALCIUM 9.8 09/07/2013 1319   PROT 6.6 09/13/2014 1228   PROT 7.7 09/07/2013 1319   ALBUMIN 4.1 09/13/2014 1228   ALBUMIN 4.7 09/07/2013 1319   AST 13 09/13/2014 1228   AST 19 09/07/2013 1319   ALT 13 09/13/2014 1228   ALT 25 09/07/2013 1319   ALKPHOS 69 09/13/2014 1228   ALKPHOS 89 09/07/2013 1319   BILITOT 0.44 09/13/2014 1228   BILITOT 0.6 09/07/2013 1319   GFRNONAA >90 11/26/2010 1107   GFRAA >90 11/26/2010 1107    INo results found for: SPEP, UPEP  Lab Results  Component Value Date   WBC 5.5 09/13/2014   NEUTROABS 3.9 09/13/2014   HGB 14.5 09/13/2014   HCT 43.8 09/13/2014   MCV 95.5 09/13/2014   PLT 311 09/13/2014      Chemistry      Component Value Date/Time   NA 144 09/13/2014 1228   NA 137 09/07/2013 1319   K 3.9 09/13/2014 1228   K 3.9 09/07/2013 1319   CL 101 09/07/2013 1319   CL 104 03/05/2012 1005   CO2 26 09/13/2014 1228   CO2 23 09/07/2013 1319   BUN 6.3* 09/13/2014 1228   BUN 15 09/07/2013 1319   CREATININE 0.9  09/13/2014 1228   CREATININE 0.88 09/07/2013 1319      Component Value Date/Time   CALCIUM 9.8 09/13/2014 1228  CALCIUM 9.8 09/07/2013 1319   ALKPHOS 69 09/13/2014 1228   ALKPHOS 89 09/07/2013 1319   AST 13 09/13/2014 1228   AST 19 09/07/2013 1319   ALT 13 09/13/2014 1228   ALT 25 09/07/2013 1319   BILITOT 0.44 09/13/2014 1228   BILITOT 0.6 09/07/2013 1319       Lab Results  Component Value Date   LABCA2 67* 10/10/2010    No components found for: [id number]  No results for input(s): INR in the last 168 hours.  Urinalysis    Component Value Date/Time   COLORURINE AMBER BIOCHEMICALS Mills BE AFFECTED BY COLOR* 09/08/2008 1519   APPEARANCEUR CLOUDY* 09/08/2008 1519   LABSPEC 1.029 09/08/2008 1519   PHURINE 6.0 09/08/2008 1519   GLUCOSEU NEGATIVE 09/08/2008 1519   HGBUR TRACE* 09/08/2008 1519   BILIRUBINUR Mills* 09/08/2008 1519   KETONESUR >80* 09/08/2008 1519   PROTEINUR 100* 09/08/2008 1519   UROBILINOGEN 0.2 09/08/2008 1519   NITRITE NEGATIVE 09/08/2008 1519   LEUKOCYTESUR NEGATIVE 09/08/2008 1519    STUDIES: Mammography shows breast density to be category D. DEXA scan 09/14/2014 shows a T score of -1.6.   ASSESSMENT: 54 y.o. [place] woman status post left mastectomy and axillary lymph node dissection 10/24/2010 for a T3 N2, stage IIIA invasive ductal carcinoma, estrogen and progesterone receptor positive, HER-2 negative, with an MIB-1 of 12%  (1) adjuvant chemotherapy consisted of cyclophosphamide and doxorubicin given in dose dense fashion 4, completed 01/10/2011, followed by paclitaxel weekly 12 completed 04/18/2011.  (2) adjuvant radiation completed 07/01/2011: Left chest wall high axilla and supraclavicular region 4500 cGy in 25 fractions. The left chest wall/mastectomy scar area was boosted further to a dose of 5940 cGy.  (3) letrozole started 07/29/2011  (a) osteopenia with a T score of -1.6 09/13/2013  PLAN: Lenka is doing well as far as her  breast cancer is concerned. She is now almost 4 years out from her definitive surgery with no evidence of recurrent disease. She is tolerating the letrozole well, besides hot flashes, and she will continue this drug for 10 years of antiestrogen therapy (given her lobular breast cancer).   She decided to try an increased dose of her gabapentin, to have greater control over her hot flashes at night, so we will double her dose to 694m to see if this helps.   EAyowill return in 6 months for labs and a follow up visit. Prior to this visit she will have a repeat mammogram. She understands and agrees with this plan. She knows the goal of treatment in her case is cure. She has been encouraged to call with any issues that might arise before her next visit here.   HLaurie Panda NP   09/20/2014 1:37 PM

## 2014-09-20 NOTE — Telephone Encounter (Signed)
Gave patient avs report and appointments for February 2017.

## 2014-09-20 NOTE — Telephone Encounter (Signed)
ERROR: Pt showed up for appointment 23 minutes late.

## 2014-10-16 ENCOUNTER — Other Ambulatory Visit: Payer: Self-pay | Admitting: Oncology

## 2014-10-28 ENCOUNTER — Ambulatory Visit (INDEPENDENT_AMBULATORY_CARE_PROVIDER_SITE_OTHER): Payer: Medicare Other | Admitting: Neurology

## 2014-10-28 ENCOUNTER — Encounter: Payer: Self-pay | Admitting: Neurology

## 2014-10-28 VITALS — BP 110/60 | HR 58 | Ht 68.0 in | Wt 180.0 lb

## 2014-10-28 DIAGNOSIS — G250 Essential tremor: Secondary | ICD-10-CM

## 2014-10-28 MED ORDER — PRIMIDONE 50 MG PO TABS: 50.0000 mg | ORAL_TABLET | Freq: Every day | ORAL | Status: DC

## 2014-10-28 NOTE — Patient Instructions (Signed)
1. Start Primidone 50 mg tablets. Take 1/2 tablet for 4 nights, then increase to 1 tablet. Prescription has been sent to your pharmacy. The first dose of medication can cause some dizziness/nausea that should go away after the first dose.

## 2014-10-28 NOTE — Progress Notes (Signed)
Note routed to Dr Dimas Aguas.

## 2014-10-28 NOTE — Progress Notes (Signed)
Subjective:   Vanessa Mills was seen in consultation in the movement disorder clinic at the request of Dr. Dimas Aguas.  Her PCP is WEBB, CAROL D, MD. The patient is seen today in neurologic consultation regarding tremor.  She has previously seen Dr. Doy Mince and Dr. Krista Blue at [facility].  Prior records available to me were reviewed.  She had been seeing Dr. Doy Mince at [facility] since approximately 2004 for essential tremor and cervical dystonia.  She was on propranolol and fairly high-dose Xanax for the symptoms.  She had been receiving Botox for cervical dystonia from 2006 - 2012, but discontinued that in 2012 as she did not think that it helped.  She states that the head shakes in the "no" direction and she notices head shake when the head is on the pillow at night.  She was discharged from [facility] for receiving Xanax from multiple prescribing sources.  She presents today to establish care with a new neurology provider.  Pt states that tremor is in both hands but more in the right.  She is right hand dominant.  She notices it with eating or writing.   She was on propranolol for tremor 40 mg bid for 5 years and it helped but her nephrologist took her off of it because of bradycardia.   Affected by caffeine:  No. Affected by alcohol:  No. Affected by stress:  Yes.   Affected by fatigue:  Yes.   Spills soup if on spoon:  Yes.   (picks up the bowl) Spills glass of liquid if full:  Yes.   Affects ADL's (tying shoes, brushing teeth, etc):  No.  Fam hx of tremor:  no  Current/Previously tried tremor medications: xanax (33m q hs - states that she takes that for sleep and cervical dystonia); gabapentin (on for hot flashes with breast CA treatment)  Current medications that may exacerbate tremor:  saphris (on for bipolar disorder - doesn't think that changed tremor); prednisone (for crohns disease - been on for 6 weeks - no change in tremor)  As above, pt is on saphris for bipolar d/o but doesn't think this  changed tremor.  On risperdal prior to that.  This is prescribed by her psychiatrist, Dr. KToy Care    Outside reports reviewed: historical medical records and office notes.  No Known Allergies  Outpatient Encounter Prescriptions as of 10/28/2014  Medication Sig  . ALPRAZolam (XANAX XR) 1 MG 24 hr tablet TAKE 2 TABLETS BY MOUTH TWICE DAILY.  .Marland Kitchenasenapine (SAPHRIS) 5 MG SUBL Place 10 mg under the tongue at bedtime.   . gabapentin (NEURONTIN) 300 MG capsule TAKE 1 CAPSULE(300 MG) BY MOUTH AT BEDTIME  . gabapentin (NEURONTIN) 600 MG tablet Take 1 tablet (600 mg total) by mouth at bedtime.  . hydrALAZINE (APRESOLINE) 25 MG tablet Take 1 tablet (25 mg total) by mouth 3 (three) times daily.  .Marland Kitchenisometheptene-acetaminophen-dichloralphenazone (MIDRIN) 65-325-100 MG capsule as needed.   .Marland Kitchenletrozole (FEMARA) 2.5 MG tablet Take 1 tablet (2.5 mg total) by mouth daily.  .Marland Kitchenlisinopril (PRINIVIL,ZESTRIL) 20 MG tablet Take 1 tablet (20 mg total) by mouth 2 (two) times daily.  . pravastatin (PRAVACHOL) 20 MG tablet Take 20 mg by mouth at bedtime.   . predniSONE (DELTASONE) 10 MG tablet Take 10 mg by mouth daily with breakfast.  . spironolactone-hydrochlorothiazide (ALDACTAZIDE) 25-25 MG tablet TK 1/2 tablets on monday and friday  . traZODone (DESYREL) 50 MG tablet Take 50 mg by mouth daily.   .Marland Kitchenzolpidem (AMBIEN)  10 MG tablet Take 10 mg by mouth at bedtime.    No facility-administered encounter medications on file as of 10/28/2014.    Past Medical History  Diagnosis Date  . Chronic mental illness   . Meniere's syndrome   . Crohn's disease   . Raynaud's disease   . GERD (gastroesophageal reflux disease)     does not take medications for   . Headache(784.0)     takes midodrine for migraines prn  . Breast cancer, ILC, Left, receptor+, Her2- 10/10/2010  . Mental disorder     bipolar, takes saphris at hs  . Chemotherapy follow-up examination 12/28/2010  . Fatigue 12/28/2010  . Breast cancer 03/07/2011  .  Bipolar 1 disorder   . S/P radiation therapy 05/15/11 - 07/01/11    Left Breast: 4500 cGy/25 fractions with Boost to Left chest Wall/Mastectomy Scar for Toal dose of 5940 cGy  . Status post chemotherapy     4 cycles AC  . Status post chemotherapy 01/31/11 - 04/18/11    Taxol x 12 weeks  . PONV (postoperative nausea and vomiting)   . Movement disorder     Past Surgical History  Procedure Laterality Date  . 2 orbital fracture surgeries    . Sinus exploration    . Bladder suspension      done 2005  . Mastectomy modified radical  10/23/2010    Left Dr Margot Chimes  . Portacath placement  11/28/2010    via right subclavian - Dr Margot Chimes  . Abdominal hysterectomy      uterus removed only  . Breast surgery      Axillary dissection  . Port-a-cath removal  08/21/2011    Procedure: REMOVAL PORT-A-CATH;  Surgeon: Haywood Lasso, MD;  Location: [facility];  Service: General;  Laterality: Right;    Social History   Social History  . Marital Status: Married    Spouse Name: Linna Hoff   . Number of Children: 1  . Years of Education: Masters   Occupational History  . unemployed    Social History Main Topics  . Smoking status: Never Smoker   . Smokeless tobacco: Never Used  . Alcohol Use: 1.7 oz/week    2 Glasses of wine, 1 Standard drinks or equivalent per week  . Drug Use: No     Comment: in college  . Sexual Activity: Yes     Comment: erpr+/HER-2 neg.   Other Topics Concern  . Not on file   Social History Narrative   Married to [place]   One dtr- 56 years old- [name]- in [place]   Patient has a Masters   Patient  has 1 child.     Family Status  Relation Status Death Age  . Mother Alive     HTN, DM  . Father Deceased     heart failure  . Sister Alive     MI  . Daughter Alive     healthy    Review of Systems Occasional blood in stool with crohns disease.  No lateralizing weakness or paresthesias.  Admits that sometimes has trouble swallowing saliva but not with  food/pills.  A complete 10 system ROS was obtained and was negative apart from what is mentioned.   Objective:   VITALS:   Filed Vitals:   10/28/14 1401  BP: 110/60  Pulse: 58  Height: 5' 8"  (1.727 m)  Weight: 180 lb (81.647 kg)   Gen:  Appears stated age and in NAD. HEENT:  Normocephalic, atraumatic.  The mucous membranes are moist. The superficial temporal arteries are without ropiness or tenderness. Cardiovascular: bradycardic with regular rhythm. Lungs: Clear to auscultation bilaterally. Neck: There are no carotid bruits noted bilaterally.  Neck turned just slightly to the left.  No head tremor.  No hypertrophy of SCM  NEUROLOGICAL:  Orientation:  The patient is alert and oriented x 3.  Recent and remote memory are intact.  Attention span and concentration are normal.  Able to name objects and repeat without trouble.  Fund of knowledge is appropriate Cranial nerves: There is good facial symmetry. The pupils are equal round and reactive to light bilaterally. Fundoscopic exam reveals clear disc margins bilaterally. Extraocular muscles are intact and visual fields are full to confrontational testing. Speech is fluent and clear. Soft palate rises symmetrically and there is no tongue deviation. Hearing is intact to conversational tone. Tone: Tone is good throughout. Sensation: Sensation is intact to light touch and pinprick throughout (facial, trunk, extremities). Vibration is intact at the bilateral big toe but slightly decreased. There is no extinction with double simultaneous stimulation. There is no sensory dermatomal level identified. Coordination:  The patient has no dysdiadichokinesia or dysmetria. Motor: Strength is 5/5 in the bilateral upper and lower extremities.  Shoulder shrug is equal bilaterally.  There is no pronator drift.  There are no fasciculations noted. DTR's: Deep tendon reflexes are 1/4 at the bilateral biceps, triceps, brachioradialis, and trace at the bilateral  patella and achilles.  Plantar responses are downgoing bilaterally. Gait and Station: The patient is able to ambulate without difficulty. The patient is able to heel toe walk without any difficulty. The patient is able to ambulate in a tandem fashion. The patient is able to stand in the Romberg position but sways with eyes closed.   MOVEMENT EXAM: Tremor:  There is mild tremor in the UE, noted most significantly with action.  The patient is not able to draw Archimedes spirals without significant difficulty (mild to mod tremor noted).  There is no tremor at rest.  The patient is not able to pour water from one glass to another without spilling it (spills minimal amount when water in right hand).     Assessment/Plan:   1.  Essential Tremor.  -This is evidenced by the symmetrical nature and longstanding hx of gradually getting worse.  -used propranolol 40 mg bid in past but stopped because of bradycardia and was still bradycardic today even off of the medication..  -start primidone - 50 mg nightly.  May help sleep as well (c/o insomnia).  Risks, benefits, side effects and alternative therapies were discussed.  The opportunity to ask questions was given and they were answered to the best of my ability.  The patient expressed understanding and willingness to follow the outlined treatment protocols.  -will not prescribe xanax.  Review of controlled substance DB since April shows that she is now only getting this from single prescriber  -not sure that I am completely convinced of cervical dystonia.  Patients can have head tremor associated with essential tremor, although I did not see that today.  She does have a slight head turned to the left, but I saw no hypertrophy at all of the SCM, which I would expect this far into the disease, nor did I see any head titubation associated with this.  She asked me if I thought she should restart Botox and I told her that I did not think we should do that right now.  -I  will try  to get a copy of her lab work from her primary care physician. 2.  I plan to see her in the next 4 months, sooner should new neurologic issues arise.  Greater than 50% of the 45 minute visit spent in counseling.

## 2014-11-16 ENCOUNTER — Other Ambulatory Visit: Payer: Self-pay | Admitting: Oncology

## 2014-12-14 ENCOUNTER — Other Ambulatory Visit: Payer: Self-pay | Admitting: Oncology

## 2015-01-12 ENCOUNTER — Other Ambulatory Visit: Payer: Self-pay | Admitting: Oncology

## 2015-01-12 NOTE — Telephone Encounter (Signed)
Chart reviewed.  Per ofc note - increased dose to 600 mg

## 2015-02-28 ENCOUNTER — Telehealth: Payer: Self-pay | Admitting: Neurology

## 2015-02-28 ENCOUNTER — Ambulatory Visit (INDEPENDENT_AMBULATORY_CARE_PROVIDER_SITE_OTHER): Payer: Medicare Other | Admitting: Neurology

## 2015-02-28 ENCOUNTER — Encounter: Payer: Self-pay | Admitting: Neurology

## 2015-02-28 ENCOUNTER — Other Ambulatory Visit (INDEPENDENT_AMBULATORY_CARE_PROVIDER_SITE_OTHER): Payer: Medicare Other

## 2015-02-28 VITALS — BP 126/60 | HR 70 | Ht 68.0 in | Wt 157.0 lb

## 2015-02-28 DIAGNOSIS — G250 Essential tremor: Secondary | ICD-10-CM

## 2015-02-28 DIAGNOSIS — G243 Spasmodic torticollis: Secondary | ICD-10-CM

## 2015-02-28 DIAGNOSIS — Z5181 Encounter for therapeutic drug level monitoring: Secondary | ICD-10-CM

## 2015-02-28 DIAGNOSIS — R251 Tremor, unspecified: Secondary | ICD-10-CM

## 2015-02-28 LAB — CBC WITH DIFFERENTIAL/PLATELET
Basophils Absolute: 0 10*3/uL (ref 0.0–0.1)
Basophils Relative: 0.3 % (ref 0.0–3.0)
Eosinophils Absolute: 0 10*3/uL (ref 0.0–0.7)
Eosinophils Relative: 0.4 % (ref 0.0–5.0)
HCT: 40.1 % (ref 36.0–46.0)
Hemoglobin: 13.1 g/dL (ref 12.0–15.0)
Lymphocytes Relative: 19.7 % (ref 12.0–46.0)
Lymphs Abs: 1.2 10*3/uL (ref 0.7–4.0)
MCHC: 32.6 g/dL (ref 30.0–36.0)
MCV: 93.7 fl (ref 78.0–100.0)
Monocytes Absolute: 0.4 10*3/uL (ref 0.1–1.0)
Monocytes Relative: 6.3 % (ref 3.0–12.0)
Neutro Abs: 4.6 10*3/uL (ref 1.4–7.7)
Neutrophils Relative %: 73.3 % (ref 43.0–77.0)
Platelets: 322 10*3/uL (ref 150.0–400.0)
RBC: 4.28 Mil/uL (ref 3.87–5.11)
RDW: 12.8 % (ref 11.5–15.5)
WBC: 6.2 10*3/uL (ref 4.0–10.5)

## 2015-02-28 LAB — COMPREHENSIVE METABOLIC PANEL
ALK PHOS: 51 U/L (ref 39–117)
ALT: 13 U/L (ref 0–35)
AST: 16 U/L (ref 0–37)
Albumin: 4.2 g/dL (ref 3.5–5.2)
BUN: 12 mg/dL (ref 6–23)
CALCIUM: 9.7 mg/dL (ref 8.4–10.5)
CO2: 27 meq/L (ref 19–32)
CREATININE: 1.01 mg/dL (ref 0.40–1.20)
Chloride: 100 mEq/L (ref 96–112)
GFR: 60.13 mL/min (ref >60.00)
Glucose, Bld: 94 mg/dL (ref 70–99)
Potassium: 3.9 mEq/L (ref 3.5–5.1)
SODIUM: 136 meq/L (ref 135–145)
TOTAL PROTEIN: 6.5 g/dL (ref 6.0–8.3)
Total Bilirubin: 0.4 mg/dL (ref 0.2–1.2)

## 2015-02-28 MED ORDER — PRIMIDONE 50 MG PO TABS: 50.0000 mg | ORAL_TABLET | Freq: Two times a day (BID) | ORAL | Status: DC

## 2015-02-28 NOTE — Patient Instructions (Signed)
1. Your provider has requested that you have labwork completed today. Please go to [facility] ([id number]) on the second floor of this building before leaving the office today. You do not need to check in. If you are not called within 15 minutes please check with the front desk.

## 2015-02-28 NOTE — Telephone Encounter (Signed)
Patient made aware labs okay.

## 2015-02-28 NOTE — Progress Notes (Signed)
Subjective:   Vanessa Mills was seen in consultation in the movement disorder clinic at the request of Dr. Dimas Aguas.  Her PCP is WEBB, CAROL D, MD. The patient is seen today in neurologic consultation regarding tremor.  She has previously seen Dr. Doy Mince and Dr. Krista Blue at [facility].  Prior records available to me were reviewed.  She had been seeing Dr. Doy Mince at [facility] since approximately 2004 for essential tremor and cervical dystonia.  She was on propranolol and fairly high-dose Xanax for the symptoms.  She had been receiving Botox for cervical dystonia from 2006 - 2012, but discontinued that in 2012 as she did not think that it helped.  She states that the head shakes in the "no" direction and she notices head shake when the head is on the pillow at night.  She was discharged from [facility] for receiving Xanax from multiple prescribing sources.  She presents today to establish care with a new neurology provider.  Pt states that tremor is in both hands but more in the right.  She is right hand dominant.  She notices it with eating or writing.   She was on propranolol for tremor 40 mg bid for 5 years and it helped but her nephrologist took her off of it because of bradycardia.   Affected by caffeine:  No. Affected by alcohol:  No. Affected by stress:  Yes.   Affected by fatigue:  Yes.   Spills soup if on spoon:  Yes.   (picks up the bowl) Spills glass of liquid if full:  Yes.   Affects ADL's (tying shoes, brushing teeth, etc):  No.  Fam hx of tremor:  no  Current/Previously tried tremor medications: xanax (22m q hs - states that she takes that for sleep and cervical dystonia); gabapentin (on for hot flashes with breast CA treatment)  Current medications that may exacerbate tremor:  saphris (on for bipolar disorder - doesn't think that changed tremor); prednisone (for crohns disease - been on for 6 weeks - no change in tremor)  As above, pt is on saphris for bipolar d/o but doesn't think this  changed tremor.  On risperdal prior to that.  This is prescribed by her psychiatrist, Dr. KToy Care    02/28/15 update:  The patient is following up today.  I started her on primidone, 50 mg last visit.  This was for essential tremor.  No SE of the medication and she asks if we can increase the medication.  She had previously tried Inderal, but is not able to use any longer because of bradycardia.  I was able to get a copy of lab work from her primary care physician.  This lab work was dated 09/13/2014.  Her BUN and creatinine were normal at 7 and 0.86 respectively.  Her AST was 14, ALT 11 and alkaline phosphatase was 61.  The most recent CBC that was sent to me was dated 06/11/2013 and it was normal.  She has been losing weight through diet and exercise.    Outside reports reviewed: historical medical records and office notes.  No Known Allergies  Outpatient Encounter Prescriptions as of 02/28/2015  Medication Sig  . ALPRAZolam (XANAX XR) 1 MG 24 hr tablet TAKE 2 TABLETS BY MOUTH TWICE DAILY.  .Marland Kitchengabapentin (NEURONTIN) 600 MG tablet Take 1 tablet (600 mg total) by mouth at bedtime.  . hydrALAZINE (APRESOLINE) 25 MG tablet Take 1 tablet (25 mg total) by mouth 3 (three) times daily.  .Marland Kitchenisometheptene-acetaminophen-dichloralphenazone (MIDRIN)  65-325-100 MG capsule as needed.   [name] letrozole (FEMARA) 2.5 MG tablet Take 1 tablet (2.5 mg total) by mouth daily.  [name] lisinopril (PRINIVIL,ZESTRIL) 20 MG tablet Take 1 tablet (20 mg total) by mouth 2 (two) times daily.  . pravastatin (PRAVACHOL) 20 MG tablet Take 20 mg by mouth at bedtime.   . primidone (MYSOLINE) 50 MG tablet Take 1 tablet (50 mg total) by mouth at bedtime.  [name] spironolactone-hydrochlorothiazide (ALDACTAZIDE) 25-25 MG tablet TK 1/2 tablets on monday and friday  . traZODone (DESYREL) 50 MG tablet Take 50 mg by mouth daily.   [name] zolpidem (AMBIEN) 10 MG tablet Take 10 mg by mouth at bedtime.   . [DISCONTINUED] asenapine (SAPHRIS) 5 MG SUBL Place 10 mg  under the tongue at bedtime.   . [DISCONTINUED] gabapentin (NEURONTIN) 300 MG capsule Take 2 capsules (600 mg total) by mouth at bedtime.  . [DISCONTINUED] predniSONE (DELTASONE) 10 MG tablet Take 10 mg by mouth daily with breakfast.   No facility-administered encounter medications on file as of 02/28/2015.    Past Medical History  Diagnosis Date  . Chronic mental illness   . Meniere's syndrome   . Crohn's disease ([place])   . Raynaud's disease   . GERD (gastroesophageal reflux disease)     does not take medications for   . Headache(784.0)     takes midodrine for migraines prn  . Breast cancer, ILC, Left, receptor+, Her2- 10/10/2010  . Mental disorder     bipolar, takes saphris at hs  . Chemotherapy follow-up examination 12/28/2010  . Fatigue 12/28/2010  . Breast cancer ([place]) 03/07/2011  . Bipolar 1 disorder ([place])   . S/P radiation therapy 05/15/11 - 07/01/11    Left Breast: 4500 cGy/25 fractions with Boost to Left chest Wall/Mastectomy Scar for Toal dose of 5940 cGy  . Status post chemotherapy     4 cycles AC  . Status post chemotherapy 01/31/11 - 04/18/11    Taxol x 12 weeks  . PONV (postoperative nausea and vomiting)   . Movement disorder     Past Surgical History  Procedure Laterality Date  . 2 orbital fracture surgeries    . Sinus exploration    . Bladder suspension      done 2005  . Mastectomy modified radical  10/23/2010    Left Dr Margot Chimes  . Portacath placement  11/28/2010    via right subclavian - Dr Margot Chimes  . Abdominal hysterectomy      uterus removed only  . Breast surgery      Axillary dissection  . Port-a-cath removal  08/21/2011    Procedure: REMOVAL PORT-A-CATH;  Surgeon: Haywood Lasso, MD;  Location: [facility];  Service: General;  Laterality: Right;    Social History   Social History  . Marital Status: Married    Spouse Name: Linna Hoff   . Number of Children: 1  . Years of Education: Masters   Occupational History  . unemployed    Social  History Main Topics  . Smoking status: Never Smoker   . Smokeless tobacco: Never Used  . Alcohol Use: 1.7 oz/week    2 Glasses of wine, 1 Standard drinks or equivalent per week  . Drug Use: No     Comment: in college  . Sexual Activity: Yes     Comment: erpr+/HER-2 neg.   Other Topics Concern  . Not on file   Social History Narrative   Married to [place]   One dtr- 57 years old-  [name]- in [place]   Patient has a Masters   Patient  has 1 child.     Family Status  Relation Status Death Age  . Mother Alive     HTN, DM  . Father Deceased     heart failure  . Sister Alive     MI  . Daughter Alive     healthy    Review of Systems Occasional blood in stool with crohns disease. A complete 10 system ROS was obtained and was negative apart from what is mentioned.   Objective:   VITALS:   Filed Vitals:   02/28/15 1407  BP: 126/60  Pulse: 70  Height: 5' 8"  (1.727 m)  Weight: 157 lb (71.215 kg)   Gen:  Appears stated age and in NAD. HEENT:  Normocephalic, atraumatic. The mucous membranes are moist. The superficial temporal arteries are without ropiness or tenderness. Cardiovascular: bradycardic with regular rhythm. Lungs: Clear to auscultation bilaterally. Neck: There are no carotid bruits noted bilaterally.  Neck turned just slightly to the left. There is evident pull of the R SCM today.  NEUROLOGICAL:  Orientation:  The patient is alert and oriented x 3.  Cranial nerves: There is good facial symmetry.  Speech is fluent and clear. Soft palate rises symmetrically and there is no tongue deviation. Hearing is intact to conversational tone. Tone: Tone is good throughout. Sensation: Sensation is intact to light touch throughout Coordination:  The patient has no dysdiadichokinesia or dysmetria. Motor: Strength is 5/5 in the bilateral upper and lower extremities.  Shoulder shrug is equal bilaterally.  There is no pronator drift.  There are no fasciculations noted. DTR's:  Deep tendon reflexes are 1/4 at the bilateral biceps, triceps, brachioradialis, and trace at the bilateral patella and achilles.  Plantar responses are downgoing bilaterally. Gait and Station: The patient is able to ambulate without difficulty.  MOVEMENT EXAM: Tremor:  There is mild tremor in the UE, noted most significantly with action.  The patient is not able to draw Archimedes spirals without significant difficulty (mild to mod tremor noted).  There is no tremor at rest.       Assessment/Plan:   1.  Essential Tremor.  -This is evidenced by the symmetrical nature and longstanding hx of gradually getting worse.  -used propranolol 40 mg bid in past but stopped because of bradycardia and was still bradycardic today even off of the medication..  -increase primidone to 50 mg bid.  Risks, benefits, side effects and alternative therapies were discussed.  The opportunity to ask questions was given and they were answered to the best of my ability.  The patient expressed understanding and willingness to follow the outlined treatment protocols.  -will not prescribe xanax.  Review of controlled substance DB since April shows that she is now only getting this from single prescriber  -labs today (CBC, chem) 2.  Cervical dystonia  -more convinced of this today.  Had contraction of the R SCM and turning of the head to the L..  Has had botox from 2006-2012 at [facility] but was sure that it was helpful and stopped.     3.  I plan to see her in the next 4 months, sooner should new neurologic issues arise.  Greater than 50% of the 25 minute visit spent in counseling.

## 2015-02-28 NOTE — Telephone Encounter (Signed)
-----   Message from [place], DO sent at 02/28/2015  4:14 PM EST ----- Let pt know that labs looked ok

## 2015-03-06 ENCOUNTER — Telehealth: Payer: Self-pay | Admitting: Neurology

## 2015-03-06 ENCOUNTER — Other Ambulatory Visit: Payer: Self-pay | Admitting: Family Medicine

## 2015-03-06 DIAGNOSIS — R29898 Other symptoms and signs involving the musculoskeletal system: Secondary | ICD-10-CM

## 2015-03-06 NOTE — Telephone Encounter (Signed)
Spoke with patient and she states she has had 3 episodes of her legs collapsing. One at the beginning of January and one in the middle of January where her legs collapsed and she fell over but was able to get back up with no lasting symptoms. This past Wednesday night she states she stood up out of bed to go to the bathroom and her legs collapsed and she was unable to stand back up due to weakness in her legs. She had to crawl to the bathroom and lift herself on the toilet with her arms only. She states her legs were nonfunctional for about 20 minutes. This is the only time this has ever happened. She has her MR scheduled at [place] tomorrow evening. I let her know we will wait and see what that shows and decide if she needs a follow up here. She agrees with this plan.

## 2015-03-06 NOTE — Telephone Encounter (Signed)
That's odd.  I just saw her and she certainly didn't say anything to me about this.  She did tell me she had been dieting and exercising and had lost a lot of weight.  Call patient and see whats going on but may have to wait to see what MRI shows and may need f/u since didn't discuss and exam was fine when I saw her

## 2015-03-06 NOTE — Telephone Encounter (Signed)
Dr. Justin Mend called re: patient. She saw her on Friday and patient complained of her legs giving out periodically for the last month. She states it has gotten so bad she has had to crawl to the bathroom because she can't walk. Dr. Justin Mend is repeating her lumbar MR to r/o mets from her history of breast cancer. Please advise.

## 2015-03-07 ENCOUNTER — Ambulatory Visit
Admission: RE | Admit: 2015-03-07 | Discharge: 2015-03-07 | Disposition: A | Discharge status: Home / Self Care | Payer: Medicare Other | Source: Ambulatory Visit | Attending: Family Medicine | Admitting: Family Medicine

## 2015-03-07 DIAGNOSIS — R29898 Other symptoms and signs involving the musculoskeletal system: Secondary | ICD-10-CM

## 2015-03-08 ENCOUNTER — Telehealth: Payer: Self-pay | Admitting: Neurology

## 2015-03-08 NOTE — Telephone Encounter (Signed)
Appt made with patient.

## 2015-03-08 NOTE — Telephone Encounter (Signed)
PT returned your call/Dawn CB# [phone]

## 2015-03-08 NOTE — Telephone Encounter (Signed)
Left message on machine for patient to call back.

## 2015-03-08 NOTE — Telephone Encounter (Signed)
-----   Message from [place], DO sent at 03/08/2015  8:57 AM EST ----- Regarding: RE: MR Just showed some mild arthritic changes.  Nothing that would cause falls.  If she wants further eval will need appt as didn't mention it at visit and exam neg ----- Message -----    From: Annamaria Helling, CMA    Sent: 03/08/2015   8:00 AM      To: Eustace Quail Tat, DO Subject: MR                                             Remember to check out her MR.   Luvenia Starch

## 2015-03-14 ENCOUNTER — Ambulatory Visit (HOSPITAL_BASED_OUTPATIENT_CLINIC_OR_DEPARTMENT_OTHER): Payer: Medicare Other | Admitting: Oncology

## 2015-03-14 ENCOUNTER — Ambulatory Visit (INDEPENDENT_AMBULATORY_CARE_PROVIDER_SITE_OTHER): Payer: Medicare Other | Admitting: Neurology

## 2015-03-14 ENCOUNTER — Encounter: Payer: Self-pay | Admitting: Neurology

## 2015-03-14 ENCOUNTER — Telehealth: Payer: Self-pay | Admitting: Oncology

## 2015-03-14 ENCOUNTER — Other Ambulatory Visit (HOSPITAL_BASED_OUTPATIENT_CLINIC_OR_DEPARTMENT_OTHER): Payer: Medicare Other

## 2015-03-14 ENCOUNTER — Other Ambulatory Visit (INDEPENDENT_AMBULATORY_CARE_PROVIDER_SITE_OTHER): Payer: Medicare Other

## 2015-03-14 VITALS — BP 130/60 | HR 83 | Ht 68.0 in | Wt 153.0 lb

## 2015-03-14 VITALS — BP 115/69 | HR 72 | Temp 99.0°F | Ht 68.0 in | Wt 152.8 lb

## 2015-03-14 DIAGNOSIS — C50812 Malignant neoplasm of overlapping sites of left female breast: Secondary | ICD-10-CM

## 2015-03-14 DIAGNOSIS — M8580 Other specified disorders of bone density and structure, unspecified site: Secondary | ICD-10-CM

## 2015-03-14 DIAGNOSIS — W19XXXA Unspecified fall, initial encounter: Secondary | ICD-10-CM

## 2015-03-14 DIAGNOSIS — C50912 Malignant neoplasm of unspecified site of left female breast: Secondary | ICD-10-CM | POA: Diagnosis not present

## 2015-03-14 DIAGNOSIS — G729 Myopathy, unspecified: Secondary | ICD-10-CM

## 2015-03-14 DIAGNOSIS — R531 Weakness: Secondary | ICD-10-CM

## 2015-03-14 DIAGNOSIS — H02402 Unspecified ptosis of left eyelid: Secondary | ICD-10-CM | POA: Diagnosis not present

## 2015-03-14 DIAGNOSIS — R251 Tremor, unspecified: Secondary | ICD-10-CM

## 2015-03-14 LAB — CBC WITH DIFFERENTIAL/PLATELET
BASO%: 0.8 % (ref 0.0–2.0)
BASOS ABS: 0 10*3/uL (ref 0.0–0.1)
EOS%: 0.8 % (ref 0.0–7.0)
Eosinophils Absolute: 0 10*3/uL (ref 0.0–0.5)
HCT: 40.3 % (ref 34.8–46.6)
HGB: 13.1 g/dL (ref 11.6–15.9)
LYMPH#: 1.1 10*3/uL (ref 0.9–3.3)
LYMPH%: 24.7 % (ref 14.0–49.7)
MCH: 30.8 pg (ref 25.1–34.0)
MCHC: 32.5 g/dL (ref 31.5–36.0)
MCV: 94.7 fL (ref 79.5–101.0)
MONO#: 0.3 10*3/uL (ref 0.1–0.9)
MONO%: 6.2 % (ref 0.0–14.0)
NEUT#: 3.1 10*3/uL (ref 1.5–6.5)
NEUT%: 67.5 % (ref 38.4–76.8)
Platelets: 286 10*3/uL (ref 145–400)
RBC: 4.26 10*6/uL (ref 3.70–5.45)
RDW: 13.3 % (ref 11.2–14.5)
WBC: 4.6 10*3/uL (ref 3.9–10.3)

## 2015-03-14 LAB — COMPREHENSIVE METABOLIC PANEL
ALT: 13 U/L (ref 0–55)
AST: 16 U/L (ref 5–34)
Albumin: 4 g/dL (ref 3.5–5.0)
Alkaline Phosphatase: 56 U/L (ref 40–150)
Anion Gap: 11 mEq/L (ref 3–11)
BUN: 6.4 mg/dL — AB (ref 7.0–26.0)
CHLORIDE: 104 meq/L (ref 98–109)
CO2: 24 meq/L (ref 22–29)
CREATININE: 0.9 mg/dL (ref 0.6–1.1)
Calcium: 9.5 mg/dL (ref 8.4–10.4)
EGFR: 69 mL/min/{1.73_m2} — ABNORMAL LOW (ref >90)
GLUCOSE: 116 mg/dL (ref 70–140)
POTASSIUM: 3.8 meq/L (ref 3.5–5.1)
SODIUM: 139 meq/L (ref 136–145)
Total Bilirubin: 0.66 mg/dL (ref 0.20–1.20)
Total Protein: 6.3 g/dL — ABNORMAL LOW (ref 6.4–8.3)

## 2015-03-14 LAB — CK: Total CK: 58 U/L (ref 7–177)

## 2015-03-14 LAB — TSH: TSH: 0.92 u[IU]/mL (ref 0.35–4.50)

## 2015-03-14 MED ORDER — LETROZOLE 2.5 MG PO TABS: 2.5000 mg | ORAL_TABLET | Freq: Every day | ORAL | Status: DC

## 2015-03-14 NOTE — Progress Notes (Signed)
[place]  [phone] [phone]) [phone]     ID: Vanessa Mills DOB: 1960/06/21  MR#: [id number]  FXT#:[id number]  Patient Care Team: Vanessa Small, MD as PCP - General (Family Medicine) Vanessa Pray, MD (Radiation Oncology) Vanessa Cruel, MD as Consulting Physician (Oncology) Vanessa Coco, MD as Consulting Physician (Cardiology) Vanessa Mutters, MD as Consulting Physician (Otolaryngology) Vanessa May, MD as Consulting Physician (Psychiatry) Vanessa Aguas, MD as Referring Physician (Internal Medicine) Vanessa Essex, MD as Consulting Physician (Gastroenterology) Vanessa Quail Tat, DO as Consulting Physician (Neurology) PCP: Vanessa Bellows, MD GYN: SU:  OTHER MD:  CHIEF COMPLAINT: Locally advanced lobular breast cancer  CURRENT TREATMENT: Letrozole   BREAST CANCER HISTORY: From Dr. Cristina Mills 10/10/2010 intake note:  "Vanessa Mills is a 55 y.o. female. She found an area that she was worried about in the left breast a few weeks ago. She also noted some nipple inversion on left about 3 weeks ago. 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 MRI has shown a fairly large area of abnormality. There is no evidence of an axillary metastasis. She comes to the breast multidisciplinary clinic for evaluation. Her primary physician is Vanessa Mills."  On review of the prior records: On 10/02/2010 the patient underwent biopsy of the left breast mass in question, showing ([id number]) and invasive lobular carcinoma (E-cadherin negative), estrogen receptor 81% positive with strong staining intensity, progesterone receptor 12% positive with moderate staining intensity, with an MIB-1 of 12% and no HER-2 amplification, the signals ratio being 1.04.  On 10/23/2010 the patient underwent left simple mastectomy and left axillary lymph node  resection. The final pathology ([id number]) showed a 5 cm invasive lobular breast cancer (with some areas of ductal differentiation), grade 2, with 6 out of 11 lymph nodes involved,  Her subsequent treatment is as detailed below  INTERVAL HISTORY: Vanessa Mills returns today for follow-up of her estrogen receptor positive breast cancer. She continues on letrozole, with good tolerance. Hot flashes are not major concerns and she is managing vaginal dryness well with lubricants.  She never developed the severe arthralgias and myalgias that many patients experience on this medication. She obtains it at a good price.  -- Since her last visit here Vanessa Mills has lost approximately 35 pounds. He is exercising several times a week very intensely and she has cut out carbohydrates entirely from your diet. She would like to lose another 10 pounds although she tells me Vanessa Mills has suggested maintaining her current weight might be a better idea.  REVIEW OF SYSTEMS: Vanessa Mills tells me some time ago when she woke up to go to the bathroom at night her legs gave way. They remained weak and she had to crawl from the bathroom to her bed. The next day the legs were fine. This has happened twice more. In between the episodes her walking and other leg functions are normal. She was set up for an MRI of the lumbar spine 03/07/2015 and this confirms of course her known scoliosis and degenerative changes. There is no high-grade spinal stenosis and no evidence of metastatic disease. She is scheduled for neurologic evaluation later this month..--Detailed review of systems today was otherwise noncontributory  PAST MEDICAL HISTORY: Past Medical History  Diagnosis Date  . Chronic mental illness   . Meniere's syndrome   . Crohn's disease ([place])   .  Raynaud's disease   . GERD (gastroesophageal reflux disease)     does not take medications for   . Headache(784.0)     takes midodrine for migraines prn  . Breast cancer, ILC, Left,  receptor+, Her2- 10/10/2010  . Mental disorder     bipolar, takes saphris at hs  . Chemotherapy follow-up examination 12/28/2010  . Fatigue 12/28/2010  . Breast cancer ([place]) 03/07/2011  . Bipolar 1 disorder ([place])   . S/P radiation therapy 05/15/11 - 07/01/11    Left Breast: 4500 cGy/25 fractions with Boost to Left chest Wall/Mastectomy Scar for Toal dose of 5940 cGy  . Status post chemotherapy     4 cycles AC  . Status post chemotherapy 01/31/11 - 04/18/11    Taxol x 12 weeks  . PONV (postoperative nausea and vomiting)   . Movement disorder     PAST SURGICAL HISTORY: Past Surgical History  Procedure Laterality Date  . 2 orbital fracture surgeries    . Sinus exploration    . Bladder suspension      done 2005  . Mastectomy modified radical  10/23/2010    Left Dr Vanessa Mills  . Portacath placement  11/28/2010    via right subclavian - Dr Vanessa Mills  . Abdominal hysterectomy      uterus removed only  . Breast surgery      Axillary dissection  . Port-a-cath removal  08/21/2011    Procedure: REMOVAL PORT-A-CATH;  Surgeon: Vanessa Lasso, MD;  Location: [facility];  Service: General;  Laterality: Right;    FAMILY HISTORY Family History  Problem Relation Age of Onset  . Cancer Maternal Aunt     breast, thyroid, colonm  . Heart failure Father   . Heart attack Sister   . Hypertension Mother   . Diabetes Mother    the patient's father died at the age of 34 with congestive heart failure. The patient's mother is living, 22 years old as of February 2016. Patient has 3 maternal aunts him a and 2 of them were diagnosed with breast cancer in their 64s and 14s. The patient herself has no brothers, 2 sisters. One sister committed suicide remotely. There is no other history of breast or ovarian cancer in the family.  GYNECOLOGIC HISTORY:  No LMP recorded. Patient has had a hysterectomy. Menarche age 4, first live birth age 8. The patient is GX P1. She underwent a simple hysterectomy  without salpingo-oophorectomy remotely, with benign pathology. She did not take hormone replacement.  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 Her husband Vanessa Mills works in Press photographer currently 4 time [place]. Their daughter is 61 years old as of February 2016, lives in [place], and works for a Copywriter, advertising. The patient has no grandchildren. She attends the [employer].    ADVANCED DIRECTIVES: Not in place   HEALTH MAINTENANCE: Social History  Substance Use Topics  . Smoking status: Never Smoker   . Smokeless tobacco: Never Used  . Alcohol Use: 1.7 oz/week    2 Glasses of wine, 1 Standard drinks or equivalent per week     Colonoscopy:  PAP:  Bone density:  Lipid panel:  No Known Allergies  Current Outpatient Prescriptions  Medication Sig Dispense Refill  . ALPRAZolam (XANAX XR) 1 MG 24 hr tablet TAKE 2 TABLETS BY MOUTH TWICE DAILY. 120 tablet 5  . gabapentin (NEURONTIN) 600 MG tablet Take 1 tablet (600 mg total) by mouth at bedtime. 90 tablet 3  .  hydrALAZINE (APRESOLINE) 25 MG tablet Take 1 tablet (25 mg total) by mouth 3 (three) times daily. 90 tablet 11  . isometheptene-acetaminophen-dichloralphenazone (MIDRIN) 65-325-100 MG capsule as needed.     [name] letrozole (FEMARA) 2.5 MG tablet Take 1 tablet (2.5 mg total) by mouth daily. 90 tablet 2  . lisinopril (PRINIVIL,ZESTRIL) 20 MG tablet Take 1 tablet (20 mg total) by mouth 2 (two) times daily.    . pravastatin (PRAVACHOL) 20 MG tablet Take 20 mg by mouth at bedtime.     . primidone (MYSOLINE) 50 MG tablet Take 1 tablet (50 mg total) by mouth 2 (two) times daily. 180 tablet 1  . spironolactone-hydrochlorothiazide (ALDACTAZIDE) 25-25 MG tablet TK 1/2 tablets on monday and friday  3  . traZODone (DESYREL) 50 MG tablet Take 50 mg by mouth daily.     [name] zolpidem (AMBIEN) 10 MG tablet Take 10 mg by mouth at bedtime.      No current facility-administered medications for this visit.    OBJECTIVE: Middle-aged  white woman who appears well Filed Vitals:   03/14/15 1306  BP: 115/69  Pulse: 72  Temp: 99 F (37.2 C)     Body mass index is 23.24 kg/(m^2).    ECOG FS:1 - Symptomatic but completely ambulatory  Sclerae unicteric, pupils round and equal Oropharynx clear and moist-- no thrush or other lesions No cervical or supraclavicular adenopathy Lungs no rales or rhonchi Heart regular rate and rhythm Abd soft, nontender, positive bowel sounds MSK scoliosis but no focal spinal tenderness, no upper extremity lymphedema Neuro: nonfocal, well oriented, appropriate affect Breasts: The right breast is unremarkable. The left breast is status post mastectomy and radiation. There is no evidence of local recurrence. The left axilla is benign.   LAB RESULTS:  CMP     Component Value Date/Time   NA 139 03/14/2015 1247   NA 136 02/28/2015 1431   K 3.8 03/14/2015 1247   K 3.9 02/28/2015 1431   CL 100 02/28/2015 1431   CL 104 03/05/2012 1005   CO2 24 03/14/2015 1247   CO2 27 02/28/2015 1431   GLUCOSE 116 03/14/2015 1247   GLUCOSE 94 02/28/2015 1431   GLUCOSE 93 03/05/2012 1005   BUN 6.4* 03/14/2015 1247   BUN 12 02/28/2015 1431   CREATININE 0.9 03/14/2015 1247   CREATININE 1.01 02/28/2015 1431   CALCIUM 9.5 03/14/2015 1247   CALCIUM 9.7 02/28/2015 1431   PROT 6.3* 03/14/2015 1247   PROT 6.5 02/28/2015 1431   ALBUMIN 4.0 03/14/2015 1247   ALBUMIN 4.2 02/28/2015 1431   AST 16 03/14/2015 1247   AST 16 02/28/2015 1431   ALT 13 03/14/2015 1247   ALT 13 02/28/2015 1431   ALKPHOS 56 03/14/2015 1247   ALKPHOS 51 02/28/2015 1431   BILITOT 0.66 03/14/2015 1247   BILITOT 0.4 02/28/2015 1431   GFRNONAA >90 11/26/2010 1107   GFRAA >90 11/26/2010 1107    INo results found for: SPEP, UPEP  Lab Results  Component Value Date   WBC 4.6 03/14/2015   NEUTROABS 3.1 03/14/2015   HGB 13.1 03/14/2015   HCT 40.3 03/14/2015   MCV 94.7 03/14/2015   PLT 286 03/14/2015      Chemistry      Component  Value Date/Time   NA 139 03/14/2015 1247   NA 136 02/28/2015 1431   K 3.8 03/14/2015 1247   K 3.9 02/28/2015 1431   CL 100 02/28/2015 1431   CL 104 03/05/2012 1005   CO2 24 03/14/2015  1247   CO2 27 02/28/2015 1431   BUN 6.4* 03/14/2015 1247   BUN 12 02/28/2015 1431   CREATININE 0.9 03/14/2015 1247   CREATININE 1.01 02/28/2015 1431      Component Value Date/Time   CALCIUM 9.5 03/14/2015 1247   CALCIUM 9.7 02/28/2015 1431   ALKPHOS 56 03/14/2015 1247   ALKPHOS 51 02/28/2015 1431   AST 16 03/14/2015 1247   AST 16 02/28/2015 1431   ALT 13 03/14/2015 1247   ALT 13 02/28/2015 1431   BILITOT 0.66 03/14/2015 1247   BILITOT 0.4 02/28/2015 1431       Lab Results  Component Value Date   LABCA2 67* 10/10/2010    No components found for: PPIRJ188  No results for input(s): INR in the last 168 hours.  Urinalysis    Component Value Date/Time   COLORURINE AMBER BIOCHEMICALS Mills BE AFFECTED BY COLOR* 09/08/2008 1519   APPEARANCEUR CLOUDY* 09/08/2008 1519   LABSPEC 1.029 09/08/2008 1519   PHURINE 6.0 09/08/2008 1519   GLUCOSEU NEGATIVE 09/08/2008 1519   HGBUR TRACE* 09/08/2008 1519   BILIRUBINUR Mills* 09/08/2008 1519   KETONESUR >80* 09/08/2008 1519   PROTEINUR 100* 09/08/2008 1519   UROBILINOGEN 0.2 09/08/2008 1519   NITRITE NEGATIVE 09/08/2008 1519   LEUKOCYTESUR NEGATIVE 09/08/2008 1519    STUDIES: Mr Lumbar Spine Wo Contrast  03/07/2015  CLINICAL DATA:  55 year old female with chronic low back pain. Within the last month legs have been giving way without warning. Breast cancer post mastectomy. Subsequent encounter. EXAM: MRI LUMBAR SPINE WITHOUT CONTRAST TECHNIQUE: Multiplanar, multisequence MR imaging of the lumbar spine was performed. No intravenous contrast was administered. COMPARISON:  01/27/2012 MR. FINDINGS: Last fully open disk space is labeled L5-S1. Present examination incorporates from T11-12 disc space through lower sacrum. Conus T12-L1 level. No evidence of  metastatic disease. No worrisome paravertebral abnormality. Scoliosis lumbar spine convex to the right. T11-12:  Minimal bulge. T12-L1: Remote Schmorl's node deformity superior endplate L1. Minimal bulge. L1-2:  Minimal bulge.  Minimal facet degenerative changes. L2-3: Remote Mills Schmorl node deformity. Facet degenerative changes. Mild rotation retrolisthesis. Mild bulge. Left lateral osteophyte without compression of exiting nerve root. No significant spinal stenosis. L3-4: Facet degenerative changes. Bulge. Minimal retrolisthesis and rotation. Mild spinal stenosis. Left lateral osteophyte without compression of the exiting left L3 nerve root. L4-5: Facet degenerative changes. Minimal retrolisthesis and slight rotation L4. Bulge greater to the right. Right foraminal/ lateral extension of bulge and osteophyte with slight encroachment upon but not significant compression of exiting right L4 nerve root. Mild spinal stenosis greater on the right. Mild right lateral recess stenosis. L5-S1: Mild facet degenerative changes greater on the right. Mild bulge. No spinal stenosis. Right lateral extension of bulge and osteophyte with slight encroachment upon but not significant compression of the exiting right L5 nerve root. IMPRESSION: Scoliosis lumbar spine convex to the right with superimposed degenerative changes more prominent on the right at the L4-5 and L5-S1 level and on the left at the L2-3 and L3-4 level as detailed above. Findings have progressed slightly since the prior exam. No high-grade spinal stenosis. No evidence of metastatic disease. Electronically Signed   By: Genia Del M.D.   On: 03/07/2015 20:25      ASSESSMENT: 55 y.o. [place] woman status post left mastectomy and axillary lymph node dissection 10/24/2010 for a T3 N2, stage IIIA invasive ductal carcinoma, estrogen and progesterone receptor positive, HER-2 negative, with an MIB-1 of 12%  (1) adjuvant chemotherapy consisted of  cyclophosphamide  and doxorubicin given in dose dense fashion 4, completed 01/10/2011, followed by paclitaxel weekly 12 completed 04/18/2011.  (2) adjuvant radiation completed 07/01/2011: Left chest wall high axilla and supraclavicular region 4500 cGy in 25 fractions. The left chest wall/mastectomy scar area was boosted further to a dose of 5940 cGy.  (3) letrozole started 07/29/2011  (a) osteopenia with a T score of -1.6 on 09/13/2013  PLAN: Persephone is now more than 4 years out from her definitive surgery with no evidence of disease recurrence. This is very favorable.  She is 3-1/2 years into letrozole. The plan is to continue this for a total of 5 years, at which point she will "graduate".  I don't have any explanation why her legs have "gone weak" 3 times. The answer is not in her lab work, or recent MRI. She already has an appointment with Dr. Carles Collet and I will be interested in the results of her evaluation.  Otherwise Shatiqua will return to see me in October after her August bone density scan. She knows to call for any problems that Mills develop before that visit.  Vanessa Cruel, MD   03/14/2015 1:43 PM

## 2015-03-14 NOTE — Telephone Encounter (Signed)
Appointments made and avs printed. Bone density scheduled at [facility] 09/15/15 @ 130 pm

## 2015-03-14 NOTE — Progress Notes (Signed)
Subjective:   Vanessa Mills was seen in consultation in the movement disorder clinic at the request of Dr. Dimas Aguas.  Her PCP is WEBB, CAROL D, MD. The patient is seen today in neurologic consultation regarding tremor.  She has previously seen Dr. Doy Mince and Dr. Krista Blue at [facility].  Prior records available to me were reviewed.  She had been seeing Dr. Doy Mince at [facility] since approximately 2004 for essential tremor and cervical dystonia.  She was on propranolol and fairly high-dose Xanax for the symptoms.  She had been receiving Botox for cervical dystonia from 2006 - 2012, but discontinued that in 2012 as she did not think that it helped.  She states that the head shakes in the "no" direction and she notices head shake when the head is on the pillow at night.  She was discharged from [facility] for receiving Xanax from multiple prescribing sources.  She presents today to establish care with a new neurology provider.  Pt states that tremor is in both hands but more in the right.  She is right hand dominant.  She notices it with eating or writing.   She was on propranolol for tremor 40 mg bid for 5 years and it helped but her nephrologist took her off of it because of bradycardia.   Affected by caffeine:  No. Affected by alcohol:  No. Affected by stress:  Yes.   Affected by fatigue:  Yes.   Spills soup if on spoon:  Yes.   (picks up the bowl) Spills glass of liquid if full:  Yes.   Affects ADL's (tying shoes, brushing teeth, etc):  No.  Fam hx of tremor:  no  Current/Previously tried tremor medications: xanax (26m q hs - states that she takes that for sleep and cervical dystonia); gabapentin (on for hot flashes with breast CA treatment)  Current medications that may exacerbate tremor:  saphris (on for bipolar disorder - doesn't think that changed tremor); prednisone (for crohns disease - been on for 6 weeks - no change in tremor)  As above, pt is on saphris for bipolar d/o but doesn't think this  changed tremor.  On risperdal prior to that.  This is prescribed by her psychiatrist, Dr. KToy Care    02/28/15 update:  The patient is following up today.  I started her on primidone, 50 mg last visit.  This was for essential tremor.  No SE of the medication and she asks if we can increase the medication.  She had previously tried Inderal, but is not able to use any longer because of bradycardia.  I was able to get a copy of lab work from her primary care physician.  This lab work was dated 09/13/2014.  Her BUN and creatinine were normal at 7 and 0.86 respectively.  Her AST was 14, ALT 11 and alkaline phosphatase was 61.  The most recent CBC that was sent to me was dated 06/11/2013 and it was normal.  She has been losing weight through diet and exercise.    03/14/15 update:  The patient presents today for follow-up, earlier than expected.  Her primary care physician called my office to let me know that she was having unexpected falls.  Interestingly, I had just seen the patient on January 31 and she did not mention any falls to me, and her primary care physician had stated that they have been going on for a month.  Her primary care physician ordered an MRI of the lumbar spine to see  if that was the etiology of the falls.  The MRI did not reveal anything that would cause falls.  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  According to the [facility] controlled substances database, she is only obtaining Xanax from her primary care physician.  She picked up Xanax, 2 mg, #30 tablets on January 17 and then again picked up 90 tablets of 2 mg Xanax on January 26 and then on February 1 picked up Xanax ER 2 mg #30 tablets (confirmed by PCP on phone that this was correct).  Pt states that the day after she saw me she got up in the middle of the night and her legs gave out on her and  she couldn't get back up.  She crawled back to bed and pulled herself into bed.  She estimates the weak legs lasted 30 min.  She had 2 episodes prior to that but they occurred during the day.  No LOC.  Both occurred when just trying to stand up from a seated position and then she would fall.  Those 2 episodes lasted 5 min. No lateralizing weakness or paresthesias.  No trouble speaking.  Admits to trouble swallowing, not necessarily with an episode, but other times as well.  No diplopia.  States that she does have blurred vision.  Doesn't know if she can climb stairs as she has not tried.  Has lost a significant amount of weight but has been trying via diet and exercise.    Talked to PCP on phone today and she said that pt was going to cardiology for presyncope but pt denies today.  Outside reports reviewed: historical medical records and office notes.  No Known Allergies  Outpatient Encounter Prescriptions as of 03/14/2015  Medication Sig  . ALPRAZolam (XANAX XR) 2 MG 24 hr tablet TK 1 T PO QD  . hydrALAZINE (APRESOLINE) 25 MG tablet Take 1 tablet (25 mg total) by mouth 3 (three) times daily.  [name] isometheptene-acetaminophen-dichloralphenazone (MIDRIN) 65-325-100 MG capsule as needed.   [name] letrozole (FEMARA) 2.5 MG tablet Take 1 tablet (2.5 mg total) by mouth daily.  [name] lisinopril (PRINIVIL,ZESTRIL) 20 MG tablet Take 1 tablet (20 mg total) by mouth 2 (two) times daily.  . pravastatin (PRAVACHOL) 20 MG tablet Take 20 mg by mouth at bedtime.   . primidone (MYSOLINE) 50 MG tablet Take 1 tablet (50 mg total) by mouth 2 (two) times daily.  [name] spironolactone-hydrochlorothiazide (ALDACTAZIDE) 25-25 MG tablet TK 1/2 tablets on monday and friday  . traZODone (DESYREL) 50 MG tablet Take 50 mg by mouth daily.   [name] zolpidem (AMBIEN) 10 MG tablet Take 10 mg by mouth at bedtime.   . gabapentin (NEURONTIN) 600 MG tablet Take 1 tablet (600 mg total) by mouth at bedtime. (Patient not taking: Reported on 03/14/2015)  .  [DISCONTINUED] ALPRAZolam (XANAX XR) 1 MG 24 hr tablet TAKE 2 TABLETS BY MOUTH TWICE DAILY.   No facility-administered encounter medications on file as of 03/14/2015.    Past Medical History  Diagnosis Date  . Chronic mental illness   . Meniere's syndrome   . Crohn's disease ([place])   . Raynaud's disease   . GERD (gastroesophageal reflux disease)     does not take medications for   . Headache(784.0)     takes midodrine for migraines prn  . Breast cancer, ILC, Left, receptor+, Her2- 10/10/2010  .  Mental disorder     bipolar, takes saphris at hs  . Chemotherapy follow-up examination 12/28/2010  . Fatigue 12/28/2010  . Breast cancer ([place]) 03/07/2011  . Bipolar 1 disorder ([place])   . S/P radiation therapy 05/15/11 - 07/01/11    Left Breast: 4500 cGy/25 fractions with Boost to Left chest Wall/Mastectomy Scar for Toal dose of 5940 cGy  . Status post chemotherapy     4 cycles AC  . Status post chemotherapy 01/31/11 - 04/18/11    Taxol x 12 weeks  . PONV (postoperative nausea and vomiting)   . Movement disorder     Past Surgical History  Procedure Laterality Date  . 2 orbital fracture surgeries    . Sinus exploration    . Bladder suspension      done 2005  . Mastectomy modified radical  10/23/2010    Left Dr Margot Chimes  . Portacath placement  11/28/2010    via right subclavian - Dr Margot Chimes  . Abdominal hysterectomy      uterus removed only  . Breast surgery      Axillary dissection  . Port-a-cath removal  08/21/2011    Procedure: REMOVAL PORT-A-CATH;  Surgeon: Haywood Lasso, MD;  Location: [place];  Service: General;  Laterality: Right;    Social History   Social History  . Marital Status: Married    Spouse Name: Linna Hoff   . Number of Children: 1  . Years of Education: Masters   Occupational History  . unemployed    Social History Main Topics  . Smoking status: Never Smoker   . Smokeless tobacco: Never Used  . Alcohol Use: 1.7 oz/week    2 Glasses of wine, 1  Standard drinks or equivalent per week  . Drug Use: No     Comment: in college  . Sexual Activity: Yes     Comment: erpr+/HER-2 neg.   Other Topics Concern  . Not on file   Social History Narrative   Married to Wallace   One dtr- 57 years old- [name]- in [place]   Patient has a Masters   Patient  has 1 child.     Family Status  Relation Status Death Age  . Mother Alive     HTN, DM  . Father Deceased     heart failure  . Sister Alive     MI  . Daughter Alive     healthy    Review of Systems Occasional blood in stool with crohns disease. A complete 10 system ROS was obtained and was negative apart from what is mentioned.   Objective:   VITALS:   Filed Vitals:   03/14/15 1429  BP: 130/60  Pulse: 83  Height: _0  (1.727 m)  Weight: 153 lb (69.4 kg)   Gen:  Appears stated age and in NAD. HEENT:  Normocephalic, atraumatic. The mucous membranes are moist. The superficial temporal arteries are without ropiness or tenderness. Cardiovascular: bradycardic with regular rhythm. Lungs: Clear to auscultation bilaterally. Neck: There are no carotid bruits noted bilaterally.  Neck turned just slightly to the left. There is evident pull of the R SCM today.  NEUROLOGICAL:  Orientation:  The patient is alert and oriented x 3.  Cranial nerves: There is good facial symmetry.  She has mild L ptosis vs pseudoptosis.  Speech is fluent and clear. Soft palate rises symmetrically and there is no tongue deviation. Hearing is intact to conversational tone. Tone: Tone is good throughout. Sensation: Sensation is intact  to light touch throughout Coordination:  The patient has no dysdiadichokinesia or dysmetria. Motor: Strength is 5/5 in the bilateral upper and lower extremities with the exception of weakness of interossei of hands bilaterally.  Has minimal trouble squatting deeply and getting back up.  Shoulder shrug is equal bilaterally.  There is no pronator drift.  There are no  fasciculations noted. DTR's: Deep tendon reflexes are 1/4 at the bilateral biceps, triceps, brachioradialis, and trace at the bilateral patella and achilles.  Plantar responses are downgoing bilaterally. Gait and Station: The patient is able to ambulate without difficulty.  MOVEMENT EXAM: Tremor:  There is mild tremor in the UE, noted most significantly with action.    There is no tremor at rest.        Assessment/Plan:   1.  Essential Tremor.  -This is evidenced by the symmetrical nature and longstanding hx of gradually getting worse.  -used propranolol 40 mg bid in past but stopped because of bradycardia and was still bradycardic today even off of the medication..  -continue primidone to 50 mg bid, which was increased by me a few weeks ago.  Risks, benefits, side effects and alternative therapies were discussed.  The opportunity to ask questions was given and they were answered to the best of my ability.  The patient expressed understanding and willingness to follow the outlined treatment protocols.  -labs today (CBC, chem) 2.  Cervical dystonia  -more convinced of this today.  Had contraction of the R SCM and turning of the head to the L..  Has had botox from 2006-2012 at [facility] but was sure that it was helpful and stopped.     3.  Falls  -wonder if meds (xanax contribute)  -check labs including tsh, cpk, aldolase, MG panel given L ptosis  -MRI brain  -if above negative, will consider EMG.  Talked to patient about that today and she was agreeable

## 2015-03-14 NOTE — Patient Instructions (Signed)
1. Your provider has requested that you have labwork completed today. Please go to [facility] ([id number]) on the second floor of this building before leaving the office today. You do not need to check in. If you are not called within 15 minutes please check with the front desk.  2. We have scheduled you at [facility] for your MRI on 03/21/15 at 10:00 am. Please arrive 15 minutes prior and [name]. If you need to reschedule for any reason please call [phone].

## 2015-03-16 LAB — ALDOLASE: Aldolase: 3.7 U/L (ref <=8.1)

## 2015-03-20 LAB — MYASTHENIA GRAVIS PANEL 2
ACETYLCHOLINE REC MOD AB: 7 %{inhibition}
Acetylcholine Rec Binding: <0.3 nmol/L
Aceytlcholine Rec Bloc Ab: <15 % of inhibition (ref <15)

## 2015-03-21 ENCOUNTER — Ambulatory Visit (HOSPITAL_COMMUNITY)
Admission: RE | Admit: 2015-03-21 | Discharge: 2015-03-21 | Disposition: A | Discharge status: Home / Self Care | Payer: Medicare Other | Source: Ambulatory Visit | Attending: Neurology | Admitting: Neurology

## 2015-03-21 DIAGNOSIS — R531 Weakness: Secondary | ICD-10-CM | POA: Diagnosis not present

## 2015-03-21 DIAGNOSIS — G729 Myopathy, unspecified: Secondary | ICD-10-CM

## 2015-03-21 DIAGNOSIS — Z853 Personal history of malignant neoplasm of breast: Secondary | ICD-10-CM | POA: Diagnosis not present

## 2015-03-21 DIAGNOSIS — S0231XA Fracture of orbital floor, right side, initial encounter for closed fracture: Secondary | ICD-10-CM | POA: Insufficient documentation

## 2015-03-21 DIAGNOSIS — W19XXXA Unspecified fall, initial encounter: Secondary | ICD-10-CM | POA: Insufficient documentation

## 2015-03-21 DIAGNOSIS — H02402 Unspecified ptosis of left eyelid: Secondary | ICD-10-CM | POA: Diagnosis present

## 2015-03-21 DIAGNOSIS — R251 Tremor, unspecified: Secondary | ICD-10-CM

## 2015-03-22 ENCOUNTER — Other Ambulatory Visit: Payer: Self-pay | Admitting: *Deleted

## 2015-03-22 DIAGNOSIS — R251 Tremor, unspecified: Secondary | ICD-10-CM

## 2015-03-22 DIAGNOSIS — W19XXXA Unspecified fall, initial encounter: Secondary | ICD-10-CM

## 2015-03-22 DIAGNOSIS — G243 Spasmodic torticollis: Secondary | ICD-10-CM

## 2015-03-22 DIAGNOSIS — G729 Myopathy, unspecified: Secondary | ICD-10-CM

## 2015-03-22 DIAGNOSIS — R531 Weakness: Secondary | ICD-10-CM

## 2015-04-04 ENCOUNTER — Telehealth: Payer: Self-pay | Admitting: Neurology

## 2015-04-04 ENCOUNTER — Ambulatory Visit (INDEPENDENT_AMBULATORY_CARE_PROVIDER_SITE_OTHER): Payer: Medicare Other | Admitting: Neurology

## 2015-04-04 DIAGNOSIS — G729 Myopathy, unspecified: Secondary | ICD-10-CM

## 2015-04-04 DIAGNOSIS — R251 Tremor, unspecified: Secondary | ICD-10-CM

## 2015-04-04 DIAGNOSIS — G243 Spasmodic torticollis: Secondary | ICD-10-CM

## 2015-04-04 DIAGNOSIS — W19XXXA Unspecified fall, initial encounter: Secondary | ICD-10-CM

## 2015-04-04 DIAGNOSIS — R531 Weakness: Secondary | ICD-10-CM

## 2015-04-04 NOTE — Procedures (Signed)
[facility]  [address]  [place], [place] [address] Tel: [phone] Fax:  [phone] Test Date:  04/04/2015  Patient: Vanessa Mills DOB: 1960/08/23 Physician: Narda Amber  Sex: Female Height: 5' 8"  Ref Phys: Alonza Bogus  ID#: [id number] Temp: 33.2C Technician: Jerilynn Mages. Dean   Patient Complaints: Extensive electrodiagnostic testing of the right lower extremity and additional studies of the right shows: 1. Bilateral sural and superficial peroneal sensory responses are within normal limits. 2. Bilateral tibial and peroneal motor responses are within normal limits. 3. Bilateral tibial H reflex studies are within normal limits. 4. There is no evidence of active or chronic motor axon loss changes affecting any of the tested muscles. Motor unit configuration and recruitment pattern is within normal limits.  Impression: This is a normal study of the lower extremities. In particular, there is no evidence of a generalized sensorimotor neuropathy or lumbosacral radiculopathy.   _____________________________ Narda Amber, D.O.    Nerve Conduction Studies Anti Sensory Summary Table   Stim Site NR Peak (ms) Norm Peak (ms) P-T Amp (V) Norm P-T Amp  Left Sup Peroneal Anti Sensory (Ant Lat Mall)  12 cm    3.9 <4.6 7.0 >4  Right Sup Peroneal Anti Sensory (Ant Lat Mall)  12 cm    3.4 <4.6 4.2 >4  Left Sural Anti Sensory (Lat Mall)  Calf    3.4 <4.6 6.9 >4  Right Sural Anti Sensory (Lat Mall)  Calf    4.3 <4.6 6.3 >4   Motor Summary Table   Stim Site NR Onset (ms) Norm Onset (ms) O-P Amp (mV) Norm O-P Amp Site1 Site2 Delta-0 (ms) Dist (cm) Vel (m/s) Norm Vel (m/s)  Left Peroneal Motor (Ext Dig Brev)  Ankle    3.8 <6.0 3.1 >2.5 B Fib Ankle 7.1 31.0 44 >40  B Fib    10.9  3.1  Poplt B Fib 1.9 10.0 53 >40  Poplt    12.8  3.1         Right Peroneal Motor (Ext Dig Brev)  Ankle    3.9 <6.0 3.0 >2.5 B Fib Ankle 7.8 33.0 42 >40  B Fib    11.7  3.0  Poplt B Fib 2.1  10.0 48 >40  Poplt    13.8  2.9         Left Tibial Motor (Abd Hall Brev)  Ankle    3.7 <6.0 9.7 >4 Knee Ankle 8.9 38.0 43 >40  Knee    12.6  6.8         Right Tibial Motor (Abd Hall Brev)  Ankle    3.7 <6.0 7.8 >4 Knee Ankle 8.6 39.0 45 >40  Knee    12.3  2.7          H Reflex Studies   NR H-Lat (ms) Lat Norm (ms) L-R H-Lat (ms)  Left Tibial (Gastroc)     33.74 <35 0.00  Right Tibial (Gastroc)     33.74 <35 0.00   EMG   Side Muscle Ins Act Fibs Psw Fasc Number Recrt Dur Dur. Amp Amp. Poly Poly. Comment  Right AntTibialis Nml Nml Nml Nml Nml Nml Nml Nml Nml Nml Nml Nml N/A  Right Gastroc Nml Nml Nml Nml Nml Nml Nml Nml Nml Nml Nml Nml N/A  Right Flex Dig Long Nml Nml Nml Nml Nml Nml Nml Nml Nml Nml Nml Nml N/A  Right RectFemoris Nml Nml Nml Nml Nml Nml Nml Nml Nml Nml Nml Nml  N/A  Right BicepsFemS Nml Nml Nml Nml Nml Nml Nml Nml Nml Nml Nml Nml N/A  Right GluteusMed Nml Nml Nml Nml Nml Nml Nml Nml Nml Nml Nml Nml N/A  Left AntTibialis Nml Nml Nml Nml Nml Nml Nml Nml Nml Nml Nml Nml N/A  Left Gastroc Nml Nml Nml Nml Nml Nml Nml Nml Nml Nml Nml Nml N/A  Left Flex Dig Long Nml Nml Nml Nml Nml Nml Nml Nml Nml Nml Nml Nml N/A  Left RectFemoris Nml Nml Nml Nml Nml Nml Nml Nml Nml Nml Nml Nml N/A  Left GluteusMed Nml Nml Nml Nml Nml Nml Nml Nml Nml Nml Nml Nml N/A      Waveforms:

## 2015-04-04 NOTE — Telephone Encounter (Signed)
Patient made aware.

## 2015-04-04 NOTE — Telephone Encounter (Signed)
-----   Message from [place], DO sent at 04/04/2015  2:53 PM EST ----- Please let pt know that her EMG was completely normal and I have not seen primary neuro reason that her legs would intermittently give out

## 2015-04-13 ENCOUNTER — Other Ambulatory Visit: Payer: Self-pay | Admitting: Oncology

## 2015-06-09 ENCOUNTER — Other Ambulatory Visit: Payer: Self-pay | Admitting: Oncology

## 2015-06-29 ENCOUNTER — Ambulatory Visit: Payer: Medicare Other | Admitting: Neurology

## 2015-07-11 ENCOUNTER — Other Ambulatory Visit: Payer: Self-pay | Admitting: Oncology

## 2015-07-11 NOTE — Telephone Encounter (Signed)
Chart reviewed.

## 2015-07-14 ENCOUNTER — Other Ambulatory Visit: Payer: Self-pay | Admitting: Oncology

## 2015-07-27 ENCOUNTER — Ambulatory Visit: Payer: Medicare Other | Admitting: Neurology

## 2015-07-27 DIAGNOSIS — Z029 Encounter for administrative examinations, unspecified: Secondary | ICD-10-CM

## 2015-09-01 ENCOUNTER — Emergency Department (HOSPITAL_COMMUNITY)
Admission: EM | Admit: 2015-09-01 | Discharge: 2015-09-01 | Disposition: A | Discharge status: Home / Self Care | Payer: Medicare Other | Attending: Emergency Medicine | Admitting: Emergency Medicine

## 2015-09-01 ENCOUNTER — Encounter (HOSPITAL_COMMUNITY): Payer: Self-pay | Admitting: Emergency Medicine

## 2015-09-01 DIAGNOSIS — K50919 Crohn's disease, unspecified, with unspecified complications: Secondary | ICD-10-CM | POA: Diagnosis not present

## 2015-09-01 DIAGNOSIS — E876 Hypokalemia: Secondary | ICD-10-CM | POA: Diagnosis not present

## 2015-09-01 DIAGNOSIS — E860 Dehydration: Secondary | ICD-10-CM

## 2015-09-01 DIAGNOSIS — R1084 Generalized abdominal pain: Secondary | ICD-10-CM

## 2015-09-01 DIAGNOSIS — K5010 Crohn's disease of large intestine without complications: Secondary | ICD-10-CM

## 2015-09-01 DIAGNOSIS — Z79899 Other long term (current) drug therapy: Secondary | ICD-10-CM | POA: Diagnosis not present

## 2015-09-01 DIAGNOSIS — R197 Diarrhea, unspecified: Secondary | ICD-10-CM | POA: Diagnosis present

## 2015-09-01 LAB — COMPREHENSIVE METABOLIC PANEL
ALT: 17 U/L (ref 14–54)
AST: 24 U/L (ref 15–41)
Albumin: 4.5 g/dL (ref 3.5–5.0)
Alkaline Phosphatase: 56 U/L (ref 38–126)
Anion gap: 10 (ref 5–15)
BUN: 7 mg/dL (ref 6–20)
CHLORIDE: 105 mmol/L (ref 101–111)
CO2: 26 mmol/L (ref 22–32)
CREATININE: 0.76 mg/dL (ref 0.44–1.00)
Calcium: 9.5 mg/dL (ref 8.9–10.3)
GFR calc non Af Amer: >60 mL/min (ref >60)
Glucose, Bld: 105 mg/dL — ABNORMAL HIGH (ref 65–99)
POTASSIUM: 3.1 mmol/L — AB (ref 3.5–5.1)
Sodium: 141 mmol/L (ref 135–145)
Total Bilirubin: 0.6 mg/dL (ref 0.3–1.2)
Total Protein: 6.3 g/dL — ABNORMAL LOW (ref 6.5–8.1)

## 2015-09-01 LAB — CBC
HEMATOCRIT: 40.6 % (ref 36.0–46.0)
Hemoglobin: 13.4 g/dL (ref 12.0–15.0)
MCH: 31.4 pg (ref 26.0–34.0)
MCHC: 33 g/dL (ref 30.0–36.0)
MCV: 95.1 fL (ref 78.0–100.0)
PLATELETS: 285 10*3/uL (ref 150–400)
RBC: 4.27 MIL/uL (ref 3.87–5.11)
RDW: 12.6 % (ref 11.5–15.5)
WBC: 4.5 10*3/uL (ref 4.0–10.5)

## 2015-09-01 LAB — URINALYSIS, ROUTINE W REFLEX MICROSCOPIC
GLUCOSE, UA: NEGATIVE mg/dL
HGB URINE DIPSTICK: NEGATIVE
Ketones, ur: 15 mg/dL — AB
Nitrite: NEGATIVE
PH: 6 (ref 5.0–8.0)
PROTEIN: 30 mg/dL — AB
Specific Gravity, Urine: 1.023 (ref 1.005–1.030)

## 2015-09-01 LAB — URINE MICROSCOPIC-ADD ON: RBC / HPF: NONE SEEN RBC/hpf (ref 0–5)

## 2015-09-01 LAB — SEDIMENTATION RATE: SED RATE: 4 mm/h (ref 0–22)

## 2015-09-01 LAB — LIPASE, BLOOD: Lipase: 27 U/L (ref 11–51)

## 2015-09-01 MED ORDER — SODIUM CHLORIDE 0.9 % IV BOLUS (SEPSIS)
1000.0000 mL | INTRAVENOUS | Status: AC
  Administered 2015-09-01: 1000 mL via INTRAVENOUS

## 2015-09-01 MED ORDER — POTASSIUM CHLORIDE 10 MEQ/100ML IV SOLN
10.0000 meq | INTRAVENOUS | Status: AC
  Administered 2015-09-01 (×3): 10 meq via INTRAVENOUS
  Filled 2015-09-01 (×3): qty 100

## 2015-09-01 MED ORDER — ONDANSETRON HCL 4 MG/2ML IJ SOLN
4.0000 mg | Freq: Once | INTRAMUSCULAR | Status: AC
  Administered 2015-09-01: 4 mg via INTRAVENOUS
  Filled 2015-09-01: qty 2

## 2015-09-01 MED ORDER — LOPERAMIDE HCL 2 MG PO CAPS
4.0000 mg | ORAL_CAPSULE | Freq: Once | ORAL | Status: AC
Start: 2015-09-01 — End: 2015-09-01
  Administered 2015-09-01: 4 mg via ORAL
  Filled 2015-09-01: qty 2

## 2015-09-01 MED ORDER — METHYLPREDNISOLONE SODIUM SUCC 125 MG IJ SOLR
125.0000 mg | Freq: Once | INTRAMUSCULAR | Status: AC
  Administered 2015-09-01: 125 mg via INTRAVENOUS
  Filled 2015-09-01: qty 2

## 2015-09-01 MED ORDER — MORPHINE SULFATE (PF) 4 MG/ML IV SOLN
4.0000 mg | Freq: Once | INTRAVENOUS | Status: DC
Start: 2015-09-01 — End: 2015-09-01
  Filled 2015-09-01: qty 1

## 2015-09-01 NOTE — ED Provider Notes (Signed)
[facility]-EMERGENCY DEPT Provider Note   CSN: [id number] Arrival date & time: 09/01/15  0032  First Provider Contact:  First MD Initiated Contact with Patient 09/01/15 0326        History   Chief Complaint Chief Complaint  Patient presents with  . Diarrhea    HPI Vanessa Mills is a 55 y.o. female.  Vanessa Mills is a 55 y.o. female  with a hx of crohn's disease, GERD, breast cancer presents to the Emergency Department complaining of gradual, persistent, progressively worsening diarrhea onset yesterday morning.  Pt reports 15-20 episodes of watery diarrhea today; no melena or hematochezia.  Associated symptoms include fatigue, generalized weakness and generalized abd pain.  No aggravating or alleviating factors.  Pt denies fever, chills, headache, neck pain, chest pain, SOB, vomiting, dysuria, hematuria.  She has not contacted her GI physician.  Pt has bloody diarrhea 3 weeks ago and was given prednisone, but she has not been taking them as directed.  Pt reports 20lb weight loss in the last 1 month.    Gastroenterology: [facility]    The history is provided by the patient and medical records. No language interpreter was used.    Past Medical History:  Diagnosis Date  . Bipolar 1 disorder (HCC)   . Breast cancer (HCC) 03/07/2011  . Breast cancer, ILC, Left, receptor+, Her2- 10/10/2010  . Chemotherapy follow-up examination 12/28/2010  . Chronic mental illness   . Crohn's disease (HCC)   . Fatigue 12/28/2010  . GERD (gastroesophageal reflux disease)    does not take medications for   . Headache(784.0)    takes midodrine for migraines prn  . Meniere's syndrome   . Mental disorder    bipolar, takes saphris at hs  . Movement disorder   . PONV (postoperative nausea and vomiting)   . Raynaud's disease   . S/P radiation therapy 05/15/11 - 07/01/11   Left Breast: 4500 cGy/25 fractions with Boost to Left chest Wall/Mastectomy Scar for Toal dose of 5940 cGy  . Status post chemotherapy      4 cycles AC  . Status post chemotherapy 01/31/11 - 04/18/11   Taxol x 12 weeks    Patient Active Problem List   Diagnosis Date Noted  . Hot flash due to medication 09/20/2014  . History of long-term use of multiple prescription drugs 05/20/2014  . Cancer of overlapping sites of left female breast (HCC) 03/18/2014  . DOE (dyspnea on exertion) 09/14/2013  . Abnormal EKG 09/14/2013  . Essential tremor 06/05/2012  . Abnormal involuntary movements(781.0) 06/05/2011  . 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 Bipolar disorder (HCC) 01/30/2011  . Chemotherapy follow-up examination 12/28/2010  . Fatigue 12/28/2010    Past Surgical History:  Procedure Laterality Date  . 2 orbital fracture surgeries    . ABDOMINAL HYSTERECTOMY     uterus removed only  . BLADDER SUSPENSION     done 2005  . BREAST SURGERY     Axillary dissection  . MASTECTOMY MODIFIED RADICAL  10/23/2010   Left Dr Streck  . PORT-A-CATH REMOVAL  08/21/2011   Procedure: REMOVAL PORT-A-CATH;  Surgeon: Christian J Streck, MD;  Location: [facility];  Service: General;  Laterality: Right;  . PORTACATH PLACEMENT  11/28/2010   via right subclavian - Dr Streck  . SINUS EXPLORATION      OB History    No data available       Home Medications    Prior to Admission medications   Medication Sig Start Date   End Date Taking? Authorizing Provider  ALPRAZolam (XANAX XR) 2 MG 24 hr tablet TK 1 T PO QD 03/01/15  Yes Historical Provider, MD  diphenhydrAMINE (BENADRYL) 25 mg capsule Take 200 mg by mouth at bedtime as needed for sleep.   Yes Historical Provider, MD  gabapentin (NEURONTIN) 300 MG capsule TAKE 2 CAPSULES(600 MG) BY MOUTH AT BEDTIME 07/11/15  Yes Gustav C Magrinat, MD  hydrALAZINE (APRESOLINE) 25 MG tablet Take 1 tablet (25 mg total) by mouth 3 (three) times daily. 11/09/13  Yes Thomas Brackbill, MD  isometheptene-acetaminophen-dichloralphenazone (MIDRIN) 65-325-100 MG capsule Take 1 capsule by mouth every 4 (four)  hours as needed for migraine.  11/19/10  Yes Historical Provider, MD  letrozole (FEMARA) 2.5 MG tablet Take 1 tablet (2.5 mg total) by mouth daily. 03/14/15  Yes Gustav C Magrinat, MD  lisinopril (PRINIVIL,ZESTRIL) 20 MG tablet Take 1 tablet (20 mg total) by mouth 2 (two) times daily. Patient taking differently: Take 40 mg by mouth 2 (two) times daily.  09/14/13  Yes Thomas Brackbill, MD  pravastatin (PRAVACHOL) 20 MG tablet Take 20 mg by mouth at bedtime.  09/21/13  Yes Historical Provider, MD  predniSONE (DELTASONE) 20 MG tablet Take 20 mg by mouth 3 (three) times a week.   Yes Historical Provider, MD  primidone (MYSOLINE) 50 MG tablet Take 1 tablet (50 mg total) by mouth 2 (two) times daily. 02/28/15  Yes Rebecca S Tat, DO  SUMAtriptan (IMITREX) 100 MG tablet Take 100 mg by mouth every 2 (two) hours as needed for migraine. May repeat in 2 hours if headache persists or recurs.   Yes Historical Provider, MD  traZODone (DESYREL) 50 MG tablet Take 50 mg by mouth daily.  10/25/13  Yes Historical Provider, MD  zolpidem (AMBIEN) 10 MG tablet Take 10 mg by mouth at bedtime.  11/12/13  Yes Historical Provider, MD    Family History Family History  Problem Relation Age of Onset  . Hypertension Mother   . Diabetes Mother   . Heart failure Father   . Cancer Maternal Aunt     breast, thyroid, colonm  . Heart attack Sister     Social History Social History  Substance Use Topics  . Smoking status: Never Smoker  . Smokeless tobacco: Never Used  . Alcohol use 1.7 oz/week    2 Glasses of wine, 1 Standard drinks or equivalent per week     Allergies   Review of patient's allergies indicates no known allergies.   Review of Systems Review of Systems  Constitutional: Negative for appetite change, diaphoresis, fatigue, fever and unexpected weight change.  HENT: Negative for mouth sores.   Eyes: Negative for visual disturbance.  Respiratory: Negative for cough, chest tightness, shortness of breath and  wheezing.   Cardiovascular: Negative for chest pain.  Gastrointestinal: Positive for abdominal pain and diarrhea. Negative for constipation, nausea and vomiting.  Endocrine: Negative for polydipsia, polyphagia and polyuria.  Genitourinary: Negative for dysuria, frequency, hematuria and urgency.  Musculoskeletal: Negative for back pain and neck stiffness.  Skin: Negative for rash.  Allergic/Immunologic: Negative for immunocompromised state.  Neurological: Positive for weakness ( generalized ). Negative for syncope, light-headedness and headaches.  Hematological: Does not bruise/bleed easily.  Psychiatric/Behavioral: Negative for sleep disturbance. The patient is not nervous/anxious.   All other systems reviewed and are negative.    Physical Exam Updated Vital Signs BP 156/94   Pulse 63   Temp 98.4 F (36.9 C) (Oral)   Resp 24   SpO2 100%     Physical Exam  Constitutional: She appears well-developed and well-nourished. No distress.  Awake, alert, nontoxic appearance  HENT:  Head: Normocephalic and atraumatic.  Mouth/Throat: Oropharynx is clear and moist. Mucous membranes are dry. No oropharyngeal exudate.  Eyes: Conjunctivae are normal. No scleral icterus.  Neck: Normal range of motion. Neck supple.  Cardiovascular: Normal rate, regular rhythm and intact distal pulses.   Pulmonary/Chest: Effort normal and breath sounds normal. No respiratory distress. She has no wheezes.  Equal chest expansion  Abdominal: Soft. She exhibits no distension and no mass. Bowel sounds are increased. There is generalized tenderness ( moderate). There is no rebound and no guarding.  Musculoskeletal: Normal range of motion. She exhibits no edema.  Neurological: She is alert.  Speech is clear and goal oriented Moves extremities without ataxia  Skin: Skin is warm and dry. She is not diaphoretic.  Psychiatric: She has a normal mood and affect.  Nursing note and vitals reviewed.    ED Treatments /  Results  Labs (all labs ordered are listed, but only abnormal results are displayed) Labs Reviewed  COMPREHENSIVE METABOLIC PANEL - Abnormal; Notable for the following:       Result Value   Potassium 3.1 (*)    Glucose, Bld 105 (*)    Total Protein 6.3 (*)    All other components within normal limits  URINALYSIS, ROUTINE W REFLEX MICROSCOPIC (NOT AT [facility]) - Abnormal; Notable for the following:    APPearance CLOUDY (*)    Bilirubin Urine SMALL (*)    Ketones, ur 15 (*)    Protein, ur 30 (*)    Leukocytes, UA SMALL (*)    All other components within normal limits  URINE MICROSCOPIC-ADD ON - Abnormal; Notable for the following:    Squamous Epithelial / LPF 0-5 (*)    Bacteria, UA RARE (*)    All other components within normal limits  LIPASE, BLOOD  CBC  SEDIMENTATION RATE    Radiology No results found.  Procedures Procedures (including critical care time)  Medications Ordered in ED Medications  potassium chloride 10 mEq in 100 mL IVPB (10 mEq Intravenous New Bag/Given 09/01/15 0514)  sodium chloride 0.9 % bolus 1,000 mL (not administered)  morphine 4 MG/ML injection 4 mg (not administered)  ondansetron (ZOFRAN) injection 4 mg (not administered)  loperamide (IMODIUM) capsule 4 mg (not administered)  sodium chloride 0.9 % bolus 1,000 mL (0 mLs Intravenous Stopped 09/01/15 0514)  methylPREDNISolone sodium succinate (SOLU-MEDROL) 125 mg/2 mL injection 125 mg (125 mg Intravenous Given 09/01/15 0451)     Initial Impression / Assessment and Plan / ED Course  I have reviewed the triage vital signs and the nursing notes.  Pertinent labs & imaging results that were available during my care of the patient were reviewed by me and considered in my medical decision making (see chart for details).  Clinical Course  Value Comment By Time  Ketones, ur: (!) 15 Mild dehydration.  Pt given fluid bolus. Jarrett Soho Othella Slappey, PA-C 08/04 1962  Potassium: (!) 3.1 Mild hypokalemia.  Begin IV  repletion Abigail Butts, PA-C 08/04 0358  WBC: 4.5 No leukocytosis Abigail Butts, PA-C 08/04 0358   Pt now c/o worsening abd pain.  Will give morphine, imodium and zofran.  Pt continues to decline CT scan in spite of risk vs benefit discussion.  Pt has been given solumedrol, potassium and fluids.  No diarrhea since midnight.   Jarrett Soho Kimyata Milich, PA-C 08/04 0606   Pt with diarrhea and abd pain.  Denies  bloody diarrhea.  Likely Crohn's flare due to hx and physical.  Pt has declined CT scan several times throughout her time in the ED.  Potential Crohn's flare treated in the ED.  Doubt appendicitis, diverticulitis or SBO.  Unable to r/o abscess without CT scan.  Pt is afebrile without tachycardia, hypotension or elevated WBC.    6:09 AM At shift change care transferred to [employer], PA-C.  He will follow Sed rate, and reassess pt.  Anticipate discharge home with close GI follow-up today.    Final Clinical Impressions(s) / ED Diagnoses   Final diagnoses:  Crohn's disease with complication, unspecified gastrointestinal tract location ([place])  Diarrhea, unspecified type  Generalized abdominal pain  Dehydration  Hypokalemia    New Prescriptions New Prescriptions   No medications on file     Abigail Butts, PA-C 09/01/15 [place], MD 09/01/15 [phone]

## 2015-09-01 NOTE — ED Provider Notes (Signed)
Patient signed out to me at shift change.    Patient has history of Crohn's.  Has had progressively worsening abdominal pain since yesterday.  15-20 episodes of diarrhea.  Abdomen is diffusely tender/uncomfortable to palpation, but no guarding.  Patient offered CT scan, but refuses at this point in time.  If she is not feeling better in the next hour or two will proceed with CT.  Is supposed to be taking prednisone, but has been noncompliant with taking it due to adverse reaction.  Patient requests that I call Dr. Watt Climes, whom she knows well and ask for any other recommendations.  Dr. Watt Climes recommends taking prednisone as prescribed.  Contact his office Monday morning if not better.   Montine Circle, PA-C 09/01/15 0848    Jola Schmidt, MD 09/01/15 [phone]

## 2015-09-01 NOTE — ED Triage Notes (Signed)
Pt. reports persistent diarrhea for several days , fatigue , generalized weakness and mild generalized abdominal discomfort , denies emesis , no fever or chills.

## 2015-09-13 ENCOUNTER — Other Ambulatory Visit: Payer: Self-pay | Admitting: Gastroenterology

## 2015-09-13 DIAGNOSIS — R634 Abnormal weight loss: Secondary | ICD-10-CM

## 2015-09-13 DIAGNOSIS — K5010 Crohn's disease of large intestine without complications: Secondary | ICD-10-CM

## 2015-09-22 ENCOUNTER — Other Ambulatory Visit: Payer: Medicare Other

## 2015-10-14 ENCOUNTER — Other Ambulatory Visit: Payer: Self-pay | Admitting: Oncology

## 2015-10-14 DIAGNOSIS — C50919 Malignant neoplasm of unspecified site of unspecified female breast: Secondary | ICD-10-CM

## 2015-11-06 ENCOUNTER — Other Ambulatory Visit: Payer: Self-pay | Admitting: *Deleted

## 2015-11-06 DIAGNOSIS — C50812 Malignant neoplasm of overlapping sites of left female breast: Secondary | ICD-10-CM

## 2015-11-07 ENCOUNTER — Other Ambulatory Visit (HOSPITAL_BASED_OUTPATIENT_CLINIC_OR_DEPARTMENT_OTHER): Payer: Medicare Other

## 2015-11-07 DIAGNOSIS — C50812 Malignant neoplasm of overlapping sites of left female breast: Secondary | ICD-10-CM

## 2015-11-07 DIAGNOSIS — C50912 Malignant neoplasm of unspecified site of left female breast: Secondary | ICD-10-CM

## 2015-11-07 LAB — CBC WITH DIFFERENTIAL/PLATELET
BASO%: 0.6 % (ref 0.0–2.0)
Basophils Absolute: 0 10*3/uL (ref 0.0–0.1)
EOS ABS: 0 10*3/uL (ref 0.0–0.5)
EOS%: 0.2 % (ref 0.0–7.0)
HCT: 38.1 % (ref 34.8–46.6)
HEMOGLOBIN: 12.4 g/dL (ref 11.6–15.9)
LYMPH%: 19.6 % (ref 14.0–49.7)
MCH: 31.1 pg (ref 25.1–34.0)
MCHC: 32.4 g/dL (ref 31.5–36.0)
MCV: 95.9 fL (ref 79.5–101.0)
MONO#: 0.4 10*3/uL (ref 0.1–0.9)
MONO%: 7.2 % (ref 0.0–14.0)
NEUT%: 72.4 % (ref 38.4–76.8)
NEUTROS ABS: 4.5 10*3/uL (ref 1.5–6.5)
Platelets: 361 10*3/uL (ref 145–400)
RBC: 3.98 10*6/uL (ref 3.70–5.45)
RDW: 13 % (ref 11.2–14.5)
WBC: 6.2 10*3/uL (ref 3.9–10.3)
lymph#: 1.2 10*3/uL (ref 0.9–3.3)

## 2015-11-07 LAB — COMPREHENSIVE METABOLIC PANEL
ALBUMIN: 3.7 g/dL (ref 3.5–5.0)
ALT: 16 U/L (ref 0–55)
AST: 19 U/L (ref 5–34)
Alkaline Phosphatase: 77 U/L (ref 40–150)
Anion Gap: 8 mEq/L (ref 3–11)
BUN: 12.7 mg/dL (ref 7.0–26.0)
CO2: 26 meq/L (ref 22–29)
Calcium: 9.3 mg/dL (ref 8.4–10.4)
Chloride: 107 mEq/L (ref 98–109)
Creatinine: 0.7 mg/dL (ref 0.6–1.1)
GLUCOSE: 89 mg/dL (ref 70–140)
Potassium: 4.3 mEq/L (ref 3.5–5.1)
SODIUM: 141 meq/L (ref 136–145)
TOTAL PROTEIN: 6.2 g/dL — AB (ref 6.4–8.3)
Total Bilirubin: 0.49 mg/dL (ref 0.20–1.20)

## 2015-11-14 ENCOUNTER — Ambulatory Visit (HOSPITAL_BASED_OUTPATIENT_CLINIC_OR_DEPARTMENT_OTHER): Payer: Medicare Other | Admitting: Oncology

## 2015-11-14 VITALS — BP 133/81 | HR 67 | Temp 98.3°F | Resp 18 | Ht 68.0 in | Wt 122.2 lb

## 2015-11-14 DIAGNOSIS — C50812 Malignant neoplasm of overlapping sites of left female breast: Secondary | ICD-10-CM

## 2015-11-14 DIAGNOSIS — M8580 Other specified disorders of bone density and structure, unspecified site: Secondary | ICD-10-CM

## 2015-11-14 DIAGNOSIS — Z170 Estrogen receptor positive status [ER+]: Secondary | ICD-10-CM

## 2015-11-14 MED ORDER — VITAMIN D 1000 UNITS PO TABS: 1000.0000 [IU] | ORAL_TABLET | Freq: Every day | ORAL | 6 refills | Status: DC

## 2015-11-14 NOTE — Progress Notes (Addendum)
[facility]  [phone] [phone]     ID: Vanessa Mills DOB: 1960/09/20  MR#: [id number]  WUX#:[id number]  Patient Care Team: Vanessa Small, MD as PCP - General (Family Medicine) Vanessa Pray, MD (Radiation Oncology) Vanessa Cruel, MD as Consulting Physician (Oncology) Vanessa Coco, MD as Consulting Physician (Cardiology) Vanessa Mutters, MD as Consulting Physician (Otolaryngology) Vanessa May, MD as Consulting Physician (Psychiatry) Vanessa Aguas, MD as Referring Physician (Internal Medicine) Vanessa Essex, MD as Consulting Physician (Gastroenterology) Vanessa Quail Tat, DO as Consulting Physician (Neurology) PCP: Vanessa Bellows, MD GYN: SU:  OTHER MD:  CHIEF COMPLAINT: Locally advanced lobular breast cancer  CURRENT TREATMENT: Letrozole   BREAST CANCER HISTORY: From Vanessa. Cristina Mills 10/10/2010 intake note:  "Vanessa Mills is a 55 y.o. female. She found an area that she was worried about in the left breast a few weeks ago. She also noted some nipple inversion on left about 3 weeks ago. 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 MRI has shown a fairly large area of abnormality. There is no evidence of an axillary metastasis. She comes to the breast multidisciplinary clinic for evaluation. Her primary physician is Vanessa.Westerman."  On review of the prior records: On 10/02/2010 the patient underwent biopsy of the left breast mass in question, showing (Vanessa Mills) and invasive lobular carcinoma (E-cadherin negative), estrogen receptor 81% positive with strong staining intensity, progesterone receptor 12% positive with moderate staining intensity, with an MIB-1 of 12% and no HER-2 amplification, the signals ratio being 1.04.  On 10/23/2010 the patient underwent left simple mastectomy and left axillary lymph node  resection. The final pathology (Vanessa Mills) showed a 5 cm invasive lobular breast cancer (with some areas of ductal differentiation), grade 2, with 6 out of 11 lymph nodes involved,  Her subsequent treatment is as detailed below  INTERVAL HISTORY: Vanessa Mills returns today for follow-up of her lobular breast cancer accompanied by her husband Vanessa Mills. She takes letrozole with no side effects that she has to report except for night sweats. These occur 3 or 4 times a week and they do wake her up she obtains a drug at a very good price.  She tells me that since she had her brief episode of GI bleeding from her Crohn's disease she has totally changed her diet to gr, fruits, and vegetables, all organic, and she is exercising 30 minutes of walking a day (a little over 2 miles each time). What I have never felt this healthy in my life".  REVIEW OF SYSTEMS: Vanessa Mills has stopped most of the supportive medicine she was taking before. Her list was updated today. She tells me her Crohn's disease is well-controlled. Losing letrozole lisinopril A detailed review of systems was entirely benign except as noted above.   PAST MEDICAL HISTORY: Past Medical History:  Diagnosis Date  . Bipolar 1 disorder ([place])   . Breast cancer ([place]) 03/07/2011  . Breast cancer, ILC, Left, receptor+, Her2- 10/10/2010  . Chemotherapy follow-up examination 12/28/2010  . Chronic mental illness   . Crohn's disease ([place])   . Fatigue 12/28/2010  . GERD (gastroesophageal reflux disease)    does not take medications for   . Headache(784.0)    takes midodrine for migraines prn  . Meniere's syndrome   . Mental disorder    bipolar, takes saphris at hs  . Movement  disorder   . PONV (postoperative nausea and vomiting)   . Raynaud's disease   . S/P radiation therapy 05/15/11 - 07/01/11   Left Breast: 4500 cGy/25 fractions with Boost to Left chest Wall/Mastectomy Scar for Toal dose of 5940 cGy  . Status post chemotherapy    4 cycles AC  .  Status post chemotherapy 01/31/11 - 04/18/11   Taxol x 12 weeks    PAST SURGICAL HISTORY: Past Surgical History:  Procedure Laterality Date  . 2 orbital fracture surgeries    . ABDOMINAL HYSTERECTOMY     uterus removed only  . BLADDER SUSPENSION     done 2005  . BREAST SURGERY     Axillary dissection  . MASTECTOMY MODIFIED RADICAL  10/23/2010   Left Vanessa Mills  . PORT-A-CATH REMOVAL  08/21/2011   Procedure: REMOVAL PORT-A-CATH;  Surgeon: Vanessa Lasso, MD;  Location: [facility];  Service: General;  Laterality: Right;  . PORTACATH PLACEMENT  11/28/2010   via right subclavian - Vanessa Mills  . SINUS EXPLORATION      FAMILY HISTORY Family History  Problem Relation Age of Onset  . Hypertension Mother   . Diabetes Mother   . Heart failure Father   . Cancer Maternal Aunt     breast, thyroid, colonm  . Heart attack Sister    the patient's father died at the age of 28 with congestive heart failure. The patient's mother is living, 55 years old as of February 2016. Patient has 3 maternal aunts him a and 2 of them were diagnosed with breast cancer in their 32s and 67s. The patient herself has no brothers, 2 sisters. One sister committed suicide remotely. There is no other history of breast or ovarian cancer in the family.  GYNECOLOGIC HISTORY:  No LMP recorded. Patient has had a hysterectomy. Menarche age 13, first live birth age 29. The patient is GX P1. She underwent a simple hysterectomy without salpingo-oophorectomy remotely, with benign pathology. She did not take hormone replacement.  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 Her husband Vanessa Mills works in Press photographer currently 4 time [place]. Their daughter is 76 years old as of February 2016, lives in [place], and works for a Copywriter, advertising. The patient has no grandchildren. She attends the [employer].    ADVANCED DIRECTIVES: Not in place   HEALTH MAINTENANCE: Social History  Substance Use  Topics  . Smoking status: Never Smoker  . Smokeless tobacco: Never Used  . Alcohol use 1.7 oz/week    2 Glasses of wine, 1 Standard drinks or equivalent per week     Colonoscopy:  PAP:  Bone density:  Lipid panel:  No Known Allergies  Current Outpatient Prescriptions  Medication Sig Dispense Refill  . lisinopril (PRINIVIL,ZESTRIL) 20 MG tablet Take 20 mg by mouth daily.    . cholecalciferol (VITAMIN D) 1000 units tablet Take 1 tablet (1,000 Units total) by mouth daily. 100 tablet 6  . hydrALAZINE (APRESOLINE) 25 MG tablet Take 1 tablet (25 mg total) by mouth 3 (three) times daily. 90 tablet 11  . letrozole (FEMARA) 2.5 MG tablet TAKE 1 TABLET(2.5 MG) BY MOUTH DAILY 90 tablet 0  . pravastatin (PRAVACHOL) 20 MG tablet Take 20 mg by mouth at bedtime.      No current facility-administered medications for this visit.     OBJECTIVE: Middle-aged white woman  In no acute distress Vitals:   11/14/15 1307  BP: 133/81  Pulse: 67  Resp:  18  Temp: 98.3 F (36.8 C)     Body mass index is 18.58 kg/m.    ECOG FS:0 - Asymptomatic  Sclerae unicteric, EOMs intact Oropharynx clear and moist No cervical or supraclavicular adenopathy Lungs no rales or rhonchi Heart regular rate and rhythm Abd soft, nontender, positive bowel sounds MSK no focal spinal tenderness, no upper extremity lymphedema Neuro: nonfocal, well oriented, appropriate affect Breasts: The right breast is unremarkable. The left breast is status post mastectomy. There is no evidence of local recurrence. The left axilla is benign.  LAB RESULTS:  CMP     Component Value Date/Time   NA 141 11/07/2015 1157   K 4.3 11/07/2015 1157   CL 105 09/01/2015 0059   CL 104 03/05/2012 1005   CO2 26 11/07/2015 1157   GLUCOSE 89 11/07/2015 1157   GLUCOSE 93 03/05/2012 1005   BUN 12.7 11/07/2015 1157   CREATININE 0.7 11/07/2015 1157   CALCIUM 9.3 11/07/2015 1157   PROT 6.2 (L) 11/07/2015 1157   ALBUMIN 3.7 11/07/2015 1157   AST  19 11/07/2015 1157   ALT 16 11/07/2015 1157   ALKPHOS 77 11/07/2015 1157   BILITOT 0.49 11/07/2015 1157   GFRNONAA >60 09/01/2015 0059   GFRAA >60 09/01/2015 0059    INo results found for: SPEP, UPEP  Lab Results  Component Value Date   WBC 6.2 11/07/2015   NEUTROABS 4.5 11/07/2015   HGB 12.4 11/07/2015   HCT 38.1 11/07/2015   MCV 95.9 11/07/2015   PLT 361 11/07/2015      Chemistry      Component Value Date/Time   NA 141 11/07/2015 1157   K 4.3 11/07/2015 1157   CL 105 09/01/2015 0059   CL 104 03/05/2012 1005   CO2 26 11/07/2015 1157   BUN 12.7 11/07/2015 1157   CREATININE 0.7 11/07/2015 1157      Component Value Date/Time   CALCIUM 9.3 11/07/2015 1157   ALKPHOS 77 11/07/2015 1157   AST 19 11/07/2015 1157   ALT 16 11/07/2015 1157   BILITOT 0.49 11/07/2015 1157       Lab Results  Component Value Date   LABCA2 67 (H) 10/10/2010    No components found for: [id number]  No results for input(s): INR in the last 168 hours.  Urinalysis    Component Value Date/Time   COLORURINE YELLOW 09/01/2015 0117   APPEARANCEUR CLOUDY (A) 09/01/2015 0117   LABSPEC 1.023 09/01/2015 0117   PHURINE 6.0 09/01/2015 0117   GLUCOSEU NEGATIVE 09/01/2015 0117   HGBUR NEGATIVE 09/01/2015 0117   BILIRUBINUR Mills (A) 09/01/2015 0117   KETONESUR 15 (A) 09/01/2015 0117   PROTEINUR 30 (A) 09/01/2015 0117   UROBILINOGEN 0.2 09/08/2008 1519   NITRITE NEGATIVE 09/01/2015 0117   LEUKOCYTESUR Mills (A) 09/01/2015 0117    STUDIES: No results found.    ASSESSMENT: 55 y.o. 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  (2) adjuvant radiation completed 07/01/2011: Left chest wall high axilla and supraclavicular region 4500 cGy in 25 fractions. The left chest wall/mastectomy scar area was boosted further to a dose of 5940 cGy.  (3) letrozole started 07/29/2011  (a) osteopenia with a T score of -1.6  on 09/13/2013  PLAN: Christne is now a little over 5 years out from definitive surgery for her locally advanced breast cancer, with no evidence of disease recurrence. This is very favorable.  We discussed the data for continuing letrozole a total of 10 years. In general this reduces outside the breast recurrence by about 1%. This is not a motor date or for her.  Accordingly the plan is to discontinue letrozole when she returns to see me June of next year, which will be her 5 year" treatment date  We discussed using gabapentin for her night sweats, which she really does not want to add any new medications after working so hard to get rid of most of her old medications.  She does have osteopenia per the repeat bone density. She has a diet which is rich in calcium. She does need to start a vitamin D supplement and I suggested 1000 units a day. We will check a vitamin D level before her return visit  She knows to call for any problems that Mills develop before then  Vanessa Cruel, MD   11/14/2015 1:45 PM   Old addendum: Bone density at [facility] 09/15/2015 showed a T score of -2.4

## 2015-12-04 ENCOUNTER — Other Ambulatory Visit: Payer: Self-pay | Admitting: Oncology

## 2015-12-04 DIAGNOSIS — C50919 Malignant neoplasm of unspecified site of unspecified female breast: Secondary | ICD-10-CM

## 2015-12-04 NOTE — Telephone Encounter (Signed)
Chart reviewed.

## 2016-02-12 ENCOUNTER — Telehealth: Payer: Self-pay | Admitting: Oncology

## 2016-02-12 ENCOUNTER — Other Ambulatory Visit: Payer: Medicare Other

## 2016-02-12 NOTE — Telephone Encounter (Signed)
Labs scheduled per patient request. 02/12/16

## 2016-02-13 ENCOUNTER — Other Ambulatory Visit (HOSPITAL_BASED_OUTPATIENT_CLINIC_OR_DEPARTMENT_OTHER): Payer: Medicare Other

## 2016-02-13 DIAGNOSIS — C50812 Malignant neoplasm of overlapping sites of left female breast: Secondary | ICD-10-CM

## 2016-02-13 DIAGNOSIS — Z170 Estrogen receptor positive status [ER+]: Principal | ICD-10-CM

## 2016-02-13 LAB — COMPREHENSIVE METABOLIC PANEL
ALBUMIN: 4.1 g/dL (ref 3.5–5.0)
ALK PHOS: 103 U/L (ref 40–150)
ALT: 18 U/L (ref 0–55)
AST: 22 U/L (ref 5–34)
Anion Gap: 9 mEq/L (ref 3–11)
BUN: 17 mg/dL (ref 7.0–26.0)
CALCIUM: 9.6 mg/dL (ref 8.4–10.4)
CO2: 25 mEq/L (ref 22–29)
Chloride: 103 mEq/L (ref 98–109)
Creatinine: 0.9 mg/dL (ref 0.6–1.1)
EGFR: 75 mL/min/{1.73_m2} — AB (ref >90)
Glucose: 94 mg/dl (ref 70–140)
POTASSIUM: 4.1 meq/L (ref 3.5–5.1)
SODIUM: 137 meq/L (ref 136–145)
Total Bilirubin: 0.52 mg/dL (ref 0.20–1.20)
Total Protein: 6.7 g/dL (ref 6.4–8.3)

## 2016-02-13 LAB — CBC WITH DIFFERENTIAL/PLATELET
BASO%: 0.4 % (ref 0.0–2.0)
BASOS ABS: 0 10*3/uL (ref 0.0–0.1)
EOS ABS: 0 10*3/uL (ref 0.0–0.5)
EOS%: 0.2 % (ref 0.0–7.0)
HEMATOCRIT: 39.2 % (ref 34.8–46.6)
HEMOGLOBIN: 13 g/dL (ref 11.6–15.9)
LYMPH%: 18.3 % (ref 14.0–49.7)
MCH: 30.5 pg (ref 25.1–34.0)
MCHC: 33.1 g/dL (ref 31.5–36.0)
MCV: 92.2 fL (ref 79.5–101.0)
MONO#: 0.3 10*3/uL (ref 0.1–0.9)
MONO%: 6.1 % (ref 0.0–14.0)
NEUT#: 4.1 10*3/uL (ref 1.5–6.5)
NEUT%: 75 % (ref 38.4–76.8)
Platelets: 288 10*3/uL (ref 145–400)
RBC: 4.25 10*6/uL (ref 3.70–5.45)
RDW: 13.7 % (ref 11.2–14.5)
WBC: 5.5 10*3/uL (ref 3.9–10.3)
lymph#: 1 10*3/uL (ref 0.9–3.3)

## 2016-07-08 ENCOUNTER — Other Ambulatory Visit (HOSPITAL_BASED_OUTPATIENT_CLINIC_OR_DEPARTMENT_OTHER): Payer: Medicare Other

## 2016-07-08 DIAGNOSIS — Z170 Estrogen receptor positive status [ER+]: Principal | ICD-10-CM

## 2016-07-08 DIAGNOSIS — C50812 Malignant neoplasm of overlapping sites of left female breast: Secondary | ICD-10-CM | POA: Diagnosis not present

## 2016-07-08 LAB — CBC WITH DIFFERENTIAL/PLATELET
BASO%: 0.4 % (ref 0.0–2.0)
Basophils Absolute: 0 10*3/uL (ref 0.0–0.1)
EOS%: 0.1 % (ref 0.0–7.0)
Eosinophils Absolute: 0 10*3/uL (ref 0.0–0.5)
HEMATOCRIT: 39.9 % (ref 34.8–46.6)
HEMOGLOBIN: 13.2 g/dL (ref 11.6–15.9)
LYMPH#: 0.8 10*3/uL — AB (ref 0.9–3.3)
LYMPH%: 12.3 % — ABNORMAL LOW (ref 14.0–49.7)
MCH: 31.5 pg (ref 25.1–34.0)
MCHC: 33.1 g/dL (ref 31.5–36.0)
MCV: 95 fL (ref 79.5–101.0)
MONO#: 0.4 10*3/uL (ref 0.1–0.9)
MONO%: 5.9 % (ref 0.0–14.0)
NEUT%: 81.3 % — AB (ref 38.4–76.8)
NEUTROS ABS: 5.4 10*3/uL (ref 1.5–6.5)
PLATELETS: 276 10*3/uL (ref 145–400)
RBC: 4.2 10*6/uL (ref 3.70–5.45)
RDW: 14.3 % (ref 11.2–14.5)
WBC: 6.6 10*3/uL (ref 3.9–10.3)

## 2016-07-08 LAB — COMPREHENSIVE METABOLIC PANEL
ALBUMIN: 4.2 g/dL (ref 3.5–5.0)
ALT: 27 U/L (ref 0–55)
ANION GAP: 9 meq/L (ref 3–11)
AST: 37 U/L — AB (ref 5–34)
Alkaline Phosphatase: 102 U/L (ref 40–150)
BUN: 14.9 mg/dL (ref 7.0–26.0)
CALCIUM: 9.8 mg/dL (ref 8.4–10.4)
CHLORIDE: 105 meq/L (ref 98–109)
CO2: 28 mEq/L (ref 22–29)
CREATININE: 0.8 mg/dL (ref 0.6–1.1)
EGFR: 85 mL/min/{1.73_m2} — ABNORMAL LOW (ref >90)
Glucose: 96 mg/dl (ref 70–140)
Potassium: 4.1 mEq/L (ref 3.5–5.1)
Sodium: 141 mEq/L (ref 136–145)
Total Bilirubin: 0.69 mg/dL (ref 0.20–1.20)
Total Protein: 6.7 g/dL (ref 6.4–8.3)

## 2016-07-09 LAB — VITAMIN D 25 HYDROXY (VIT D DEFICIENCY, FRACTURES): VIT D 25 HYDROXY: 35.9 ng/mL (ref 30.0–100.0)

## 2016-07-10 ENCOUNTER — Ambulatory Visit (HOSPITAL_BASED_OUTPATIENT_CLINIC_OR_DEPARTMENT_OTHER): Payer: Medicare Other | Admitting: Oncology

## 2016-07-10 VITALS — BP 139/78 | HR 61 | Temp 98.8°F | Resp 20 | Ht 68.0 in | Wt 121.8 lb

## 2016-07-10 DIAGNOSIS — C50812 Malignant neoplasm of overlapping sites of left female breast: Secondary | ICD-10-CM

## 2016-07-10 DIAGNOSIS — Z170 Estrogen receptor positive status [ER+]: Principal | ICD-10-CM

## 2016-07-10 DIAGNOSIS — Z853 Personal history of malignant neoplasm of breast: Secondary | ICD-10-CM | POA: Diagnosis not present

## 2016-07-10 NOTE — Progress Notes (Signed)
[place]  [phone] [phone]     ID: Vanessa Mills DOB: [birth year]  MR#: [id number]  LID#:[id number]  Patient Care Team: Maurice Small, MD as PCP - General (Family Medicine) Gery Pray, MD (Radiation Oncology) Rual Vermeer, Virgie Dad, MD as Consulting Physician (Oncology) Darlin Coco, MD as Consulting Physician (Cardiology) Vicie Mutters, MD as Consulting Physician (Otolaryngology) Chucky May, MD as Consulting Physician (Psychiatry) Dimas Aguas, MD as Referring Physician (Internal Medicine) Clarene Essex, MD as Consulting Physician (Gastroenterology) Tat, Eustace Quail, DO as Consulting Physician (Neurology) PCP: Maurice Small, MD GYN: SU:  OTHER MD:  CHIEF COMPLAINT: Locally advanced lobular breast cancer  CURRENT TREATMENT: Completing 5 years of letrozole    BREAST CANCER HISTORY: From Dr. Cristina Gong 10/10/2010 intake note:  "Vanessa Mills is a 56 y.o. female. She found an area that she was worried about in the left breast a few weeks ago. She also noted some nipple inversion on left about 3 weeks ago. 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 MRI has shown a fairly large area of abnormality. There is no evidence of an axillary metastasis. She comes to the breast multidisciplinary clinic for evaluation. Her primary physician is Dr.Westerman."  On review of the prior records: On 10/02/2010 the patient underwent biopsy of the left breast mass in question, showing ([id number]) and invasive lobular carcinoma (E-cadherin negative), estrogen receptor 81% positive with strong staining intensity, progesterone receptor 12% positive with moderate staining intensity, with an MIB-1 of 12% and no HER-2 amplification, the signals ratio being 1.04.  On 10/23/2010 the patient underwent left simple mastectomy  and left axillary lymph node resection. The final pathology ([id number]) showed a 5 cm invasive lobular breast cancer (with some areas of ductal differentiation), grade 2, with 6 out of 11 lymph nodes involved,  Her subsequent treatment is as detailed below  INTERVAL HISTORY: [name] returns today for follow-up of her estrogen receptor positive breast cancer. She continues on letrozole, with good tolerance.  Hot flashes and vaginal dryness are not a major issue. She never developed the arthralgias or myalgias that many patients can experience on this medication. She obtains it at a good price.   REVIEW OF SYSTEMS: Vanessa Mills continues on an excellent diet. She runs (fartleks) 3-4 miles almost every morning. She sleeps well. She denies any mood swings, unusual pain fever, rash, bleeding or bruising, unexplained weight loss or unexplained fatigue. They have been no unusual headaches, visual changes, nausea, vomiting, cough, phlegm production or pleurisy. A detailed review of systems today was otherwise stable  PAST MEDICAL HISTORY: Past Medical History:  Diagnosis Date  . Bipolar 1 disorder ([place])   . Breast cancer ([place]) 03/07/2011  . Breast cancer, ILC, Left, receptor+, Her2- 10/10/2010  . Chemotherapy follow-up examination 12/28/2010  . Chronic mental illness   . Crohn's disease ([place])   . Fatigue 12/28/2010  . GERD (gastroesophageal reflux disease)    does not take medications for   . Headache(784.0)    takes midodrine for migraines prn  . Meniere's syndrome   . Mental disorder    bipolar, takes saphris at hs  . Movement disorder   . PONV (postoperative nausea and vomiting)   . Raynaud's disease   . S/P radiation therapy 05/15/11 - 07/01/11   Left Breast: 4500 cGy/25 fractions with Boost to Left chest Wall/Mastectomy Scar  for Toal dose of 5940 cGy  . Status post chemotherapy    4 cycles AC  . Status post chemotherapy 01/31/11 - 04/18/11   Taxol x 12 weeks    PAST SURGICAL  HISTORY: Past Surgical History:  Procedure Laterality Date  . 2 orbital fracture surgeries    . ABDOMINAL HYSTERECTOMY     uterus removed only  . BLADDER SUSPENSION     done 2005  . BREAST SURGERY     Axillary dissection  . MASTECTOMY MODIFIED RADICAL  10/23/2010   Left Dr Margot Chimes  . PORT-A-CATH REMOVAL  08/21/2011   Procedure: REMOVAL PORT-A-CATH;  Surgeon: Haywood Lasso, MD;  Location: [facility];  Service: General;  Laterality: Right;  . PORTACATH PLACEMENT  11/28/2010   via right subclavian - Dr Margot Chimes  . SINUS EXPLORATION      FAMILY HISTORY Family History  Problem Relation Age of Onset  . Hypertension Mother   . Diabetes Mother   . Heart failure Father   . Cancer Maternal Aunt        breast, thyroid, colonm  . Heart attack Sister    the patient's father died at the age of 55 with congestive heart failure. The patient's mother is living, 61 years old as of February 2016. Patient has 3 maternal aunts him a and 2 of them were diagnosed with breast cancer in their 72s and 12s. The patient herself has no brothers, 2 sisters. One sister committed suicide remotely. There is no other history of breast or ovarian cancer in the family.  GYNECOLOGIC HISTORY:  No LMP recorded. Patient has had a hysterectomy. Menarche age 33, first live birth age 48. The patient is GX P1. She underwent a simple hysterectomy without salpingo-oophorectomy remotely, with benign pathology. She did not take hormone replacement.  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 Her husband Linna Hoff works in Press photographer currently 4 time [place]. Their daughter is 2 years old as of February 2016, lives in [place], and works for a Copywriter, advertising. The patient has no grandchildren. She attends the [employer].    ADVANCED DIRECTIVES: Not in place   HEALTH MAINTENANCE: Social History  Substance Use Topics  . Smoking status: Never Smoker  . Smokeless tobacco: Never Used  .  Alcohol use 1.7 oz/week    2 Glasses of wine, 1 Standard drinks or equivalent per week     Colonoscopy:  PAP:  Bone density:  Lipid panel:  No Known Allergies  Current Outpatient Prescriptions  Medication Sig Dispense Refill  . cholecalciferol (VITAMIN D) 1000 units tablet Take 1 tablet (1,000 Units total) by mouth daily. 100 tablet 6  . hydrALAZINE (APRESOLINE) 25 MG tablet Take 1 tablet (25 mg total) by mouth 3 (three) times daily. 90 tablet 11  . letrozole (FEMARA) 2.5 MG tablet TAKE 1 TABLET(2.5 MG) BY MOUTH DAILY 30 tablet 11  . lisinopril (PRINIVIL,ZESTRIL) 20 MG tablet Take 20 mg by mouth daily.    . pravastatin (PRAVACHOL) 20 MG tablet Take 20 mg by mouth at bedtime.      No current facility-administered medications for this visit.     OBJECTIVE: Middle-aged white woman Who appears well  Vitals:   07/10/16 1205  BP: 139/78  Pulse: 61  Resp: 20  Temp: 98.8 F (37.1 C)     Body mass index is 18.52 kg/m.    ECOG FS:0 - Asymptomatic  Sclerae unicteric, pupils round and equal Oropharynx clear and moist  No cervical or supraclavicular adenopathy Lungs no rales or rhonchi Heart regular rate and rhythm Abd soft, nontender, positive bowel sounds MSK no focal spinal tenderness, no upper extremity lymphedema Neuro: nonfocal, well oriented, appropriate affect Breasts: The right breast is benign. Left breast is undergone mastectomy. There is no evidence of local recurrence. Both axillae are benign.  LAB RESULTS:  CMP     Component Value Date/Time   NA 141 07/08/2016 1029   K 4.1 07/08/2016 1029   CL 105 09/01/2015 0059   CL 104 03/05/2012 1005   CO2 28 07/08/2016 1029   GLUCOSE 96 07/08/2016 1029   GLUCOSE 93 03/05/2012 1005   BUN 14.9 07/08/2016 1029   CREATININE 0.8 07/08/2016 1029   CALCIUM 9.8 07/08/2016 1029   PROT 6.7 07/08/2016 1029   ALBUMIN 4.2 07/08/2016 1029   AST 37 (H) 07/08/2016 1029   ALT 27 07/08/2016 1029   ALKPHOS 102 07/08/2016 1029    BILITOT 0.69 07/08/2016 1029   GFRNONAA >60 09/01/2015 0059   GFRAA >60 09/01/2015 0059    INo results found for: SPEP, UPEP  Lab Results  Component Value Date   WBC 6.6 07/08/2016   NEUTROABS 5.4 07/08/2016   HGB 13.2 07/08/2016   HCT 39.9 07/08/2016   MCV 95.0 07/08/2016   PLT 276 07/08/2016      Chemistry      Component Value Date/Time   NA 141 07/08/2016 1029   K 4.1 07/08/2016 1029   CL 105 09/01/2015 0059   CL 104 03/05/2012 1005   CO2 28 07/08/2016 1029   BUN 14.9 07/08/2016 1029   CREATININE 0.8 07/08/2016 1029      Component Value Date/Time   CALCIUM 9.8 07/08/2016 1029   ALKPHOS 102 07/08/2016 1029   AST 37 (H) 07/08/2016 1029   ALT 27 07/08/2016 1029   BILITOT 0.69 07/08/2016 1029       Lab Results  Component Value Date   LABCA2 67 (H) 10/10/2010    No components found for: [id number]  No results for input(s): INR in the last 168 hours.  Urinalysis    Component Value Date/Time   COLORURINE YELLOW 09/01/2015 0117   APPEARANCEUR CLOUDY (A) 09/01/2015 0117   LABSPEC 1.023 09/01/2015 0117   PHURINE 6.0 09/01/2015 0117   GLUCOSEU NEGATIVE 09/01/2015 0117   HGBUR NEGATIVE 09/01/2015 0117   BILIRUBINUR SMALL (A) 09/01/2015 0117   KETONESUR 15 (A) 09/01/2015 0117   PROTEINUR 30 (A) 09/01/2015 0117   UROBILINOGEN 0.2 09/08/2008 1519   NITRITE NEGATIVE 09/01/2015 0117   LEUKOCYTESUR SMALL (A) 09/01/2015 0117    STUDIES: Mammography at [facility] February of this year was benign as reported   ASSESSMENT: 56 y.o. [place] woman status post left mastectomy and axillary lymph node dissection 10/24/2010 for a T3 N2, stage IIIA invasive ductal carcinoma of overlapping sites, estrogen and progesterone receptor positive, HER-2 negative, with an MIB-1 of 12%  (1) adjuvant chemotherapy consisted of cyclophosphamide and doxorubicin given in dose dense fashion 4, completed 01/10/2011, followed by paclitaxel weekly 12 completed 04/18/2011.  (2) adjuvant  radiation completed 07/01/2011: Left chest wall high axilla and supraclavicular region 4500 cGy in 25 fractions. The left chest wall/mastectomy scar area was boosted further to a dose of 5940 cGy.  (3) letrozole started 07/29/2011, completing 5 years June 2018  (a) osteopenia with a T score of -1.6 on 09/13/2013 DEXA scan  (b) bone density at [facility] 09/15/2015 showed a T score of -2.4  PLAN: Akila is now just  about 6 years out from definitive surgery for her breast cancer with no evidence of disease recurrence. This is very favorable. She is completing 5 years of aromatase inhibitor therapy this month. She has tolerated this well. Nevertheless there has been a significant decrease in her bone density  We discussed the possible benefit of continuing anastrozole an additional 2 years good in terms of outside the breast recurrence, there is a risk reduction in the 1% range. This was not motivating to her.  Accordingly we are stopping anti-estrogens and at this point I am comfortable releasing her to her primary care physician. All she will need in terms of breast cancer follow-up is yearly mammography and a yearly physician breast exam.  I will be glad to see Benjamine Mola at any point in the future if on when the need arises but as of now are making no further routine appointment for her here  Chauncey Cruel, MD   07/10/2016 8:42 PM   Old addendum:

## 2016-07-25 ENCOUNTER — Ambulatory Visit (HOSPITAL_COMMUNITY)
Admission: RE | Admit: 2016-07-25 | Discharge: 2016-07-25 | Disposition: A | Discharge status: Home / Self Care | Payer: 59 | Attending: Psychiatry | Admitting: Psychiatry

## 2016-07-26 ENCOUNTER — Emergency Department (HOSPITAL_COMMUNITY)
Admission: EM | Admit: 2016-07-26 | Discharge: 2016-07-27 | Disposition: A | Discharge status: Other Acute Inpatient Hospital | Payer: Medicare Other | Attending: Emergency Medicine | Admitting: Emergency Medicine

## 2016-07-26 ENCOUNTER — Ambulatory Visit (HOSPITAL_COMMUNITY)
Admission: RE | Admit: 2016-07-26 | Discharge: 2016-07-26 | Disposition: A | Discharge status: Home / Self Care | Payer: 59 | Attending: Psychiatry | Admitting: Psychiatry

## 2016-07-26 ENCOUNTER — Encounter (HOSPITAL_COMMUNITY): Payer: Self-pay

## 2016-07-26 DIAGNOSIS — F3164 Bipolar disorder, current episode mixed, severe, with psychotic features: Secondary | ICD-10-CM | POA: Diagnosis not present

## 2016-07-26 DIAGNOSIS — Z1389 Encounter for screening for other disorder: Secondary | ICD-10-CM | POA: Diagnosis not present

## 2016-07-26 DIAGNOSIS — F3113 Bipolar disorder, current episode manic without psychotic features, severe: Secondary | ICD-10-CM | POA: Diagnosis not present

## 2016-07-26 DIAGNOSIS — C50812 Malignant neoplasm of overlapping sites of left female breast: Secondary | ICD-10-CM | POA: Insufficient documentation

## 2016-07-26 DIAGNOSIS — Z046 Encounter for general psychiatric examination, requested by authority: Secondary | ICD-10-CM | POA: Insufficient documentation

## 2016-07-26 DIAGNOSIS — Z853 Personal history of malignant neoplasm of breast: Secondary | ICD-10-CM | POA: Insufficient documentation

## 2016-07-26 DIAGNOSIS — Z79899 Other long term (current) drug therapy: Secondary | ICD-10-CM | POA: Insufficient documentation

## 2016-07-26 DIAGNOSIS — R440 Auditory hallucinations: Secondary | ICD-10-CM | POA: Insufficient documentation

## 2016-07-26 DIAGNOSIS — Z9114 Patient's other noncompliance with medication regimen: Secondary | ICD-10-CM | POA: Insufficient documentation

## 2016-07-26 DIAGNOSIS — G4700 Insomnia, unspecified: Secondary | ICD-10-CM | POA: Insufficient documentation

## 2016-07-26 LAB — RAPID URINE DRUG SCREEN, HOSP PERFORMED
AMPHETAMINES: NOT DETECTED
BENZODIAZEPINES: NOT DETECTED
Barbiturates: NOT DETECTED
Cocaine: NOT DETECTED
OPIATES: NOT DETECTED
Tetrahydrocannabinol: NOT DETECTED
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
## 2016-07-26 NOTE — ED Notes (Signed)
Patient arrived from [facility] after assessment completed with tech-to stay in [facility] overnight until bed assignment at [facility] can be obtained tomorrow.
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
## 2016-07-26 NOTE — ED Notes (Signed)
Visiting hours explained to patient's husband

## 2016-07-26 NOTE — ED Notes (Signed)
Bed: WTR9 Expected date:  Expected time:  Means of arrival:  Comments:

## 2016-07-26 NOTE — H&P (Signed)
Behavioral Health Medical Screening Exam  Vanessa Mills is an 56 y.o. female.  Total Time spent with patient: 15 minutes  Psychiatric Specialty Exam: Physical Exam  Constitutional: She is oriented to person, place, and time. She appears well-developed and well-nourished. No distress.  HENT:  Head: Normocephalic and atraumatic.  Right Ear: External ear normal.  Left Ear: External ear normal.  Eyes: Conjunctivae are normal. Pupils are equal, round, and reactive to light. Right eye exhibits no discharge. Left eye exhibits no discharge. No scleral icterus.  Neck: Normal range of motion.  Cardiovascular: Normal rate, regular rhythm and normal heart sounds.   Respiratory: Effort normal and breath sounds normal. No respiratory distress.  Musculoskeletal: Normal range of motion.  Neurological: She is alert and oriented to person, place, and time.  Skin: Skin is warm and dry. She is not diaphoretic.  Psychiatric: Her mood appears anxious. She is actively hallucinating. She is not slowed and not withdrawn. Thought content is not paranoid and not delusional. Cognition and memory are normal. She expresses impulsivity and inappropriate judgment. She exhibits a depressed mood. She expresses no homicidal and no suicidal ideation. She is attentive.    Review of Systems  Psychiatric/Behavioral: Positive for depression and hallucinations. Negative for memory loss, substance abuse and suicidal ideas. The patient is nervous/anxious and has insomnia.     Blood pressure 139/79, pulse 62, temperature 99.1 F (37.3 C), resp. rate 16, SpO2 100 %.There is no height or weight on file to calculate BMI.  General Appearance: Casual and Fairly Groomed  Eye Contact:  Good  Speech:  Clear and Coherent and Normal Rate  Volume:  Normal  Mood:  Anxious and Depressed  Affect:  Appropriate and Depressed  Thought Process:  Coherent and Goal Directed  Orientation:  Full (Time, Place, and Person)  Thought Content:   Logical and Hallucinations: Command:  Tell her to walk/run, talk to people about Jesus  Suicidal Thoughts:  No  Homicidal Thoughts:  No  Memory:  Immediate;   Good Recent;   Good Remote;   Good  Judgement:  Impaired  Insight:  Lacking  Psychomotor Activity:  Normal  Concentration: Concentration: Fair and Attention Span: Fair  Recall:  Good  Fund of Knowledge:Good  Language: Good  Akathisia:  No  Handed:  Right  AIMS (if indicated):     Assets:  Communication Skills Desire for Improvement Financial Resources/Insurance Housing Intimacy Leisure Time Physical Health Resilience Transportation  Sleep:       Musculoskeletal: Strength & Muscle Tone: within normal limits Gait & Station: normal   Blood pressure 139/79, pulse 62, temperature 99.1 F (37.3 C), resp. rate 16, SpO2 100 %.  Recommendations:  Based on my evaluation the patient does not appear to have an emergency medical condition.  Rozetta Nunnery, NP 07/26/2016, 10:21 PM

## 2016-07-27 ENCOUNTER — Inpatient Hospital Stay (HOSPITAL_COMMUNITY)
Admission: AD | Admit: 2016-07-27 | Discharge: 2016-08-06 | DRG: 885 | Disposition: A | Discharge status: Home / Self Care | Payer: Medicare Other | Source: Intra-hospital | Attending: Psychiatry | Admitting: Psychiatry

## 2016-07-27 DIAGNOSIS — Z923 Personal history of irradiation: Secondary | ICD-10-CM | POA: Diagnosis not present

## 2016-07-27 DIAGNOSIS — Z803 Family history of malignant neoplasm of breast: Secondary | ICD-10-CM

## 2016-07-27 DIAGNOSIS — Z833 Family history of diabetes mellitus: Secondary | ICD-10-CM

## 2016-07-27 DIAGNOSIS — H8109 Meniere's disease, unspecified ear: Secondary | ICD-10-CM | POA: Diagnosis present

## 2016-07-27 DIAGNOSIS — R440 Auditory hallucinations: Secondary | ICD-10-CM | POA: Diagnosis present

## 2016-07-27 DIAGNOSIS — Z90710 Acquired absence of both cervix and uterus: Secondary | ICD-10-CM | POA: Diagnosis not present

## 2016-07-27 DIAGNOSIS — I10 Essential (primary) hypertension: Secondary | ICD-10-CM | POA: Diagnosis present

## 2016-07-27 DIAGNOSIS — I7300 Raynaud's syndrome without gangrene: Secondary | ICD-10-CM | POA: Diagnosis present

## 2016-07-27 DIAGNOSIS — F3164 Bipolar disorder, current episode mixed, severe, with psychotic features: Secondary | ICD-10-CM | POA: Diagnosis present

## 2016-07-27 DIAGNOSIS — Z853 Personal history of malignant neoplasm of breast: Secondary | ICD-10-CM | POA: Diagnosis not present

## 2016-07-27 DIAGNOSIS — Z808 Family history of malignant neoplasm of other organs or systems: Secondary | ICD-10-CM | POA: Diagnosis not present

## 2016-07-27 DIAGNOSIS — F3110 Bipolar disorder, current episode manic without psychotic features, unspecified: Secondary | ICD-10-CM

## 2016-07-27 DIAGNOSIS — F329 Major depressive disorder, single episode, unspecified: Secondary | ICD-10-CM | POA: Diagnosis present

## 2016-07-27 DIAGNOSIS — R443 Hallucinations, unspecified: Secondary | ICD-10-CM | POA: Diagnosis not present

## 2016-07-27 DIAGNOSIS — K5090 Crohn's disease, unspecified, without complications: Secondary | ICD-10-CM | POA: Diagnosis present

## 2016-07-27 DIAGNOSIS — Z800 Family history of malignant neoplasm of digestive organs: Secondary | ICD-10-CM

## 2016-07-27 DIAGNOSIS — Z9114 Patient's other noncompliance with medication regimen: Secondary | ICD-10-CM

## 2016-07-27 DIAGNOSIS — Z9221 Personal history of antineoplastic chemotherapy: Secondary | ICD-10-CM

## 2016-07-27 DIAGNOSIS — Z8249 Family history of ischemic heart disease and other diseases of the circulatory system: Secondary | ICD-10-CM

## 2016-07-27 DIAGNOSIS — F29 Unspecified psychosis not due to a substance or known physiological condition: Secondary | ICD-10-CM | POA: Diagnosis not present

## 2016-07-27 LAB — COMPREHENSIVE METABOLIC PANEL
ALBUMIN: 4.5 g/dL (ref 3.5–5.0)
ALK PHOS: 85 U/L (ref 38–126)
ALT: 29 U/L (ref 14–54)
ANION GAP: 9 (ref 5–15)
AST: 47 U/L — AB (ref 15–41)
BILIRUBIN TOTAL: 0.3 mg/dL (ref 0.3–1.2)
BUN: 19 mg/dL (ref 6–20)
CO2: 26 mmol/L (ref 22–32)
CREATININE: 0.74 mg/dL (ref 0.44–1.00)
Calcium: 9 mg/dL (ref 8.9–10.3)
Chloride: 112 mmol/L — ABNORMAL HIGH (ref 101–111)
GFR calc Af Amer: >60 mL/min (ref >60)
GFR calc non Af Amer: >60 mL/min (ref >60)
Glucose, Bld: 113 mg/dL — ABNORMAL HIGH (ref 65–99)
Potassium: 3.5 mmol/L (ref 3.5–5.1)
SODIUM: 147 mmol/L — AB (ref 135–145)
TOTAL PROTEIN: 6.8 g/dL (ref 6.5–8.1)

## 2016-07-27 LAB — ACETAMINOPHEN LEVEL: Acetaminophen (Tylenol), Serum: <10 ug/mL — ABNORMAL LOW (ref 10–30)

## 2016-07-27 LAB — CBC
HCT: 39.1 % (ref 36.0–46.0)
HEMOGLOBIN: 12.6 g/dL (ref 12.0–15.0)
MCH: 30.4 pg (ref 26.0–34.0)
MCHC: 32.2 g/dL (ref 30.0–36.0)
MCV: 94.4 fL (ref 78.0–100.0)
Platelets: 291 10*3/uL (ref 150–400)
RBC: 4.14 MIL/uL (ref 3.87–5.11)
RDW: 13.5 % (ref 11.5–15.5)
WBC: 7.4 10*3/uL (ref 4.0–10.5)

## 2016-07-27 LAB — SALICYLATE LEVEL: Salicylate Lvl: <7 mg/dL (ref 2.8–30.0)

## 2016-07-27 LAB — ETHANOL: Alcohol, Ethyl (B): <5 mg/dL (ref <5)

## 2016-07-27 MED ORDER — ONDANSETRON HCL 4 MG PO TABS: 4.0000 mg | ORAL_TABLET | Freq: Three times a day (TID) | ORAL | Status: DC | PRN

## 2016-07-27 MED ORDER — ALPRAZOLAM 0.5 MG PO TABS
0.5000 mg | ORAL_TABLET | Freq: Every evening | ORAL | Status: DC | PRN
  Administered 2016-07-27: 0.5 mg via ORAL
  Filled 2016-07-27: qty 1

## 2016-07-27 MED ORDER — HYDROXYZINE HCL 25 MG PO TABS
25.0000 mg | ORAL_TABLET | Freq: Two times a day (BID) | ORAL | Status: DC
  Administered 2016-07-27: 25 mg via ORAL
  Filled 2016-07-27: qty 1

## 2016-07-27 MED ORDER — RISPERIDONE 1 MG PO TABS
1.0000 mg | ORAL_TABLET | Freq: Two times a day (BID) | ORAL | Status: DC
  Administered 2016-07-27 – 2016-07-29 (×4): 1 mg via ORAL
  Filled 2016-07-27 (×8): qty 1

## 2016-07-27 MED ORDER — CARBAMAZEPINE 100 MG PO CHEW
100.0000 mg | CHEWABLE_TABLET | Freq: Two times a day (BID) | ORAL | Status: DC
  Administered 2016-07-27: 100 mg via ORAL
  Filled 2016-07-27 (×2): qty 1

## 2016-07-27 MED ORDER — TRAZODONE HCL 50 MG PO TABS: 50.0000 mg | ORAL_TABLET | Freq: Every day | ORAL | Status: DC

## 2016-07-27 MED ORDER — NICOTINE 21 MG/24HR TD PT24
21.0000 mg | MEDICATED_PATCH | Freq: Every day | TRANSDERMAL | Status: DC
  Filled 2016-07-27 (×3): qty 1

## 2016-07-27 MED ORDER — ZOLPIDEM TARTRATE 5 MG PO TABS
5.0000 mg | ORAL_TABLET | Freq: Every evening | ORAL | Status: DC | PRN
  Administered 2016-07-27: 5 mg via ORAL
  Filled 2016-07-27: qty 1

## 2016-07-27 MED ORDER — RISPERIDONE 0.5 MG PO TABS
0.5000 mg | ORAL_TABLET | Freq: Two times a day (BID) | ORAL | Status: DC
  Administered 2016-07-27: 0.5 mg via ORAL
  Filled 2016-07-27: qty 1

## 2016-07-27 MED ORDER — CARBAMAZEPINE 100 MG PO CHEW
200.0000 mg | CHEWABLE_TABLET | Freq: Two times a day (BID) | ORAL | Status: DC
  Administered 2016-07-27: 200 mg via ORAL
  Filled 2016-07-27 (×6): qty 2

## 2016-07-27 MED ORDER — IBUPROFEN 200 MG PO TABS: 600.0000 mg | ORAL_TABLET | Freq: Three times a day (TID) | ORAL | Status: DC | PRN

## 2016-07-27 MED ORDER — IBUPROFEN 600 MG PO TABS: 600.0000 mg | ORAL_TABLET | Freq: Three times a day (TID) | ORAL | Status: DC | PRN

## 2016-07-27 MED ORDER — ACETAMINOPHEN 325 MG PO TABS: 650.0000 mg | ORAL_TABLET | ORAL | Status: DC | PRN

## 2016-07-27 MED ORDER — TRAZODONE HCL 50 MG PO TABS
50.0000 mg | ORAL_TABLET | Freq: Every day | ORAL | Status: DC
  Administered 2016-07-28 – 2016-08-05 (×9): 50 mg via ORAL
  Filled 2016-07-27 (×13): qty 1

## 2016-07-27 MED ORDER — RISPERIDONE 1 MG PO TABS: 1.0000 mg | ORAL_TABLET | Freq: Two times a day (BID) | ORAL | Status: DC

## 2016-07-27 MED ORDER — HYDRALAZINE HCL 25 MG PO TABS
25.0000 mg | ORAL_TABLET | Freq: Three times a day (TID) | ORAL | Status: DC
  Administered 2016-07-27: 25 mg via ORAL
  Filled 2016-07-27 (×2): qty 1

## 2016-07-27 MED ORDER — VITAMIN D3 25 MCG (1000 UNIT) PO TABS
1000.0000 [IU] | ORAL_TABLET | Freq: Every day | ORAL | Status: DC
  Administered 2016-07-28 – 2016-08-06 (×10): 1000 [IU] via ORAL
  Filled 2016-07-27 (×14): qty 1

## 2016-07-27 MED ORDER — ALUM & MAG HYDROXIDE-SIMETH 200-200-20 MG/5ML PO SUSP: 30.0000 mL | Freq: Four times a day (QID) | ORAL | Status: DC | PRN

## 2016-07-27 MED ORDER — NICOTINE 21 MG/24HR TD PT24: 21.0000 mg | MEDICATED_PATCH | Freq: Every day | TRANSDERMAL | Status: DC

## 2016-07-27 MED ORDER — VITAMIN D3 25 MCG (1000 UNIT) PO TABS
1000.0000 [IU] | ORAL_TABLET | Freq: Every day | ORAL | Status: DC
  Administered 2016-07-27: 1000 [IU] via ORAL
  Filled 2016-07-27: qty 1

## 2016-07-27 MED ORDER — CARBAMAZEPINE 100 MG PO CHEW
200.0000 mg | CHEWABLE_TABLET | Freq: Two times a day (BID) | ORAL | Status: DC
  Filled 2016-07-27: qty 2

## 2016-07-27 MED ORDER — HYDROXYZINE HCL 25 MG PO TABS
25.0000 mg | ORAL_TABLET | Freq: Two times a day (BID) | ORAL | Status: DC
  Administered 2016-07-27 – 2016-08-06 (×19): 25 mg via ORAL
  Filled 2016-07-27 (×28): qty 1

## 2016-07-27 MED ORDER — MAGNESIUM HYDROXIDE 400 MG/5ML PO SUSP: 30.0000 mL | Freq: Every day | ORAL | Status: DC | PRN

## 2016-07-27 NOTE — ED Notes (Signed)
Pt presents with increased anxiety. This nurse notified May NP. Awaiting orders.

## 2016-07-27 NOTE — ED Notes (Signed)
Pt husband at bedside visiting with pt.  

## 2016-07-27 NOTE — Progress Notes (Signed)
Patient is currently lying in bed asleep eyes closed and respirations even and unlabored.  At beginning of shift patient had attempted to leave the unit. Writer spoke with her and asked where she wanted to go and she reported that she was going to heaven. Writer informed her where she was and spoke with her about getting well and taking her medications. She received her scheduled medications and was offered snacks but declined. Safety maintained on unit with sitter at bedside.

## 2016-07-27 NOTE — ED Notes (Signed)
Pt was a walk in across the street at [facility]. Pt will have a bed in the morning per [facility]

## 2016-07-27 NOTE — ED Provider Notes (Signed)
[facility] DEPT Provider Note   CSN: [id number] Arrival date & time: 07/26/16  2213     History   Chief Complaint Chief Complaint  Patient presents with  . Psychiatric Evaluation    HPI  Blood pressure (!) 149/85, pulse 61, temperature 99.5 F (37.5 C), temperature source Oral, resp. rate 20, height 5' 7"  (1.702 m), weight 52.2 kg (115 lb), SpO2 99 %.  Vanessa Mills is a 56 y.o. female with past medical history significant for Crohn's disease and bipolar disorder sent for evaluation of auditory command hallucinations telling her to go places and knock on people's doors. She believes that Jesus is speaking to her. She denies any drug use, suicidal ideation, homicidal ideation. As per her husband she has been intermittently compliant with her psychiatric medications. She denies any visual hallucinations, chest pain, abdominal pain, change in bowel or bladder habits. On review of systems husband's note that she has not been sleeping well normally over the last several weeks.  Past Medical History:  Diagnosis Date  . Bipolar 1 disorder ([place])   . Breast cancer ([place]) 03/07/2011  . Breast cancer, ILC, Left, receptor+, Her2- 10/10/2010  . Chemotherapy follow-up examination 12/28/2010  . Chronic mental illness   . Crohn's disease ([place])   . Fatigue 12/28/2010  . GERD (gastroesophageal reflux disease)    does not take medications for   . Headache(784.0)    takes midodrine for migraines prn  . Meniere's syndrome   . Mental disorder    bipolar, takes saphris at hs  . Movement disorder   . PONV (postoperative nausea and vomiting)   . Raynaud's disease   . S/P radiation therapy 05/15/11 - 07/01/11   Left Breast: 4500 cGy/25 fractions with Boost to Left chest Wall/Mastectomy Scar for Toal dose of 5940 cGy  . Status post chemotherapy    4 cycles AC  . Status post chemotherapy 01/31/11 - 04/18/11   Taxol x 12 weeks    Patient Active Problem List   Diagnosis Date Noted  . Hot flash due  to medication 09/20/2014  . History of long-term use of multiple prescription drugs 05/20/2014  . Malignant neoplasm of overlapping sites of left breast in female, estrogen receptor positive ([place]) 03/18/2014  . DOE (dyspnea on exertion) 09/14/2013  . Abnormal EKG 09/14/2013  . Essential tremor 06/05/2012  . Abnormal involuntary movements(781.0) 06/05/2011  . 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 Bipolar disorder ([place]) 01/30/2011  . Chemotherapy follow-up examination 12/28/2010  . Fatigue 12/28/2010    Past Surgical History:  Procedure Laterality Date  . 2 orbital fracture surgeries    . ABDOMINAL HYSTERECTOMY     uterus removed only  . BLADDER SUSPENSION     done 2005  . BREAST SURGERY     Axillary dissection  . MASTECTOMY MODIFIED RADICAL  10/23/2010   Left Dr Margot Chimes  . PORT-A-CATH REMOVAL  08/21/2011   Procedure: REMOVAL PORT-A-CATH;  Surgeon: Haywood Lasso, MD;  Location: [facility];  Service: General;  Laterality: Right;  . PORTACATH PLACEMENT  11/28/2010   via right subclavian - Dr Margot Chimes  . SINUS EXPLORATION      OB History    No data available       Home Medications    Prior to Admission medications   Medication Sig Start Date End Date Taking? Authorizing Provider  ALPRAZolam Duanne Moron) 0.5 MG tablet Take 0.5 mg by mouth at bedtime as needed for anxiety.   Yes [provider]  carbamazepine (  TEGRETOL) 100 MG chewable tablet Chew 100 mg by mouth 2 (two) times daily. 05/07/16  Yes [provider]  cholecalciferol (VITAMIN D) 1000 units tablet Take 1 tablet (1,000 Units total) by mouth daily. 11/14/15  Yes Magrinat, Virgie Dad, MD  hydrALAZINE (APRESOLINE) 25 MG tablet Take 1 tablet (25 mg total) by mouth 3 (three) times daily. Patient not taking: Reported on 07/26/2016 11/09/13   Darlin Coco, MD  letrozole [facility]) 2.5 MG tablet TAKE 1 TABLET(2.5 MG) BY MOUTH DAILY Patient not taking: Reported on 07/26/2016 12/04/15   Magrinat, Virgie Dad, MD    Family History Family History  Problem Relation Age of Onset  . Hypertension Mother   . Diabetes Mother   . Heart failure Father   . Cancer Maternal Aunt        breast, thyroid, colonm  . Heart attack Sister     Social History Social History  Substance Use Topics  . Smoking status: Never Smoker  . Smokeless tobacco: Never Used  . Alcohol use 1.7 oz/week    2 Glasses of wine, 1 Standard drinks or equivalent per week     Allergies   Patient has no known allergies.   Review of Systems Review of Systems  A complete review of systems was obtained and all systems are negative except as noted in the HPI and PMH.    Physical Exam Updated Vital Signs BP (!) 149/85 (BP Location: Right Arm)   Pulse 61   Temp 99.5 F (37.5 C) (Oral)   Resp 20   Ht 5' 7"  (1.702 m)   Wt 52.2 kg (115 lb)   SpO2 99%   BMI 18.01 kg/m   Physical Exam  Constitutional: She is oriented to person, place, and time. She appears well-developed and well-nourished. No distress.  HENT:  Head: Normocephalic and atraumatic.  Mouth/Throat: Oropharynx is clear and moist.  Eyes: Conjunctivae and EOM are normal. Pupils are equal, round, and reactive to light.  Neck: Normal range of motion.  Cardiovascular: Normal rate, regular rhythm and intact distal pulses.   Pulmonary/Chest: Effort normal and breath sounds normal.  Abdominal: Soft. There is no tenderness.  Musculoskeletal: Normal range of motion.  Neurological: She is alert and oriented to person, place, and time.  Skin: She is not diaphoretic.  Psychiatric: She has a normal mood and affect.  Nursing note and vitals reviewed.    ED Treatments / Results  Labs (all labs ordered are listed, but only abnormal results are displayed) Labs Reviewed  COMPREHENSIVE METABOLIC PANEL - Abnormal; Notable for the following:       Result Value   Sodium 147 (*)    Chloride 112 (*)    Glucose, Bld 113 (*)    AST 47 (*)    All other components  within normal limits  ACETAMINOPHEN LEVEL - Abnormal; Notable for the following:    Acetaminophen (Tylenol), Serum <10 (*)    All other components within normal limits  ETHANOL  SALICYLATE LEVEL  CBC  RAPID URINE DRUG SCREEN, HOSP PERFORMED    EKG  EKG Interpretation None       Radiology No results found.  Procedures Procedures (including critical care time)  Medications Ordered in ED Medications  acetaminophen (TYLENOL) tablet 650 mg (not administered)  alum & mag hydroxide-simeth (MAALOX/MYLANTA) 200-200-20 MG/5ML suspension 30 mL (not administered)  ibuprofen (ADVIL,MOTRIN) tablet 600 mg (not administered)  nicotine (NICODERM CQ - dosed in mg/24 hours) patch 21 mg (not administered)  ondansetron (ZOFRAN)  tablet 4 mg (not administered)  zolpidem (AMBIEN) tablet 5 mg (not administered)  carbamazepine (TEGRETOL) chewable tablet 100 mg (not administered)  cholecalciferol (VITAMIN D) tablet 1,000 Units (not administered)  ALPRAZolam (XANAX) tablet 0.5 mg (not administered)     Initial Impression / Assessment and Plan / ED Course  I have reviewed the triage vital signs and the nursing notes.  Pertinent labs & imaging results that were available during my care of the patient were reviewed by me and considered in my medical decision making (see chart for details).     Vitals:   07/26/16 2310 07/26/16 2313  BP: (!) 149/85   Pulse: 61   Resp: 20   Temp: 99.5 F (37.5 C)   TempSrc: Oral   SpO2: 99%   Weight:  52.2 kg (115 lb)  Height:  5' 7"  (1.702 m)    Medications  acetaminophen (TYLENOL) tablet 650 mg (not administered)  alum & mag hydroxide-simeth (MAALOX/MYLANTA) 200-200-20 MG/5ML suspension 30 mL (not administered)  ibuprofen (ADVIL,MOTRIN) tablet 600 mg (not administered)  nicotine (NICODERM CQ - dosed in mg/24 hours) patch 21 mg (not administered)  ondansetron (ZOFRAN) tablet 4 mg (not administered)  zolpidem (AMBIEN) tablet 5 mg (not administered)    carbamazepine (TEGRETOL) chewable tablet 100 mg (not administered)  cholecalciferol (VITAMIN D) tablet 1,000 Units (not administered)  ALPRAZolam (XANAX) tablet 0.5 mg (not administered)    KATARYNA MCQUILKIN is 56 y.o. female presenting with Auditory command hallucinations, poor sleep and intermittent medication compliance. She has been evaluated at behavioral health and was asked to present to the emergency department holding for room in the a.m.  Patient is medically cleared for psychiatric evaluation will be transferred to the psych ED. TTS consulted, home meds and psych standard holding orders placed.     Final Clinical Impressions(s) / ED Diagnoses   Final diagnoses:  Auditory hallucinations    New Prescriptions New Prescriptions   No medications on file     Waynetta Pean 07/27/16 0034    Gareth Morgan, MD 07/30/16 1316

## 2016-07-27 NOTE — ED Notes (Signed)
Pt came out of her room, tested the exit doors, came to the desk and informed staff that when people disappear all the doors need to be opened. Pt then returned to her room.

## 2016-07-27 NOTE — ED Notes (Signed)
This nurse notified EDP of pt bp.

## 2016-07-27 NOTE — ED Notes (Signed)
Pt compliant with medication regimen. Noted talking to self in room, anxious, religiosity noted. Encouragement and support provided. Special checks q 15 mins in place for safety, video monitoring in place. Will continue to monitor.

## 2016-07-27 NOTE — ED Notes (Signed)
Pt denies A/ VH but is either talking to the sandwich she asked for, or pt is responding to self.

## 2016-07-27 NOTE — ED Notes (Signed)
[facility] transport on unit to transfer pt to [facility] Adult unit per MD order. Pt signed for personal property and property given to transport for transfer. Pt signed e-signature. Ambulatory off unit.

## 2016-07-27 NOTE — ED Notes (Signed)
SBAR Report received from previous nurse. Pt received calm and visible on unit. Pt denies current SI/ HI, A/V H, depression, anxiety, or pain at this time, and appears otherwise stable and free of distress. Pt reminded of camera surveillance, q 15 min rounds, and rules of the milieu. Will continue to assess. 

## 2016-07-27 NOTE — Progress Notes (Signed)
1:1 observation note:  Patient 1:1 observation due high fall risk and patient presented catatonic when she was assessed in the search room.  Per [facility], when patient arrived via [facility], she attempted to "walk off."  Patient then had to be escorted to the search room where she would not cooperate with staff.  [facility] and female nurse had to disrobe patient and put her clothing on for her.  Skin assessment was done and patient has a left scar due to mastectomy.  She also had a scratch to her lower left abdomen area.  Patient was escorted to her room by two female staff members.  She was put in her bed.  Patient was left alone.  Within 15 minutes, patient had got up from bed and wandered to the 300 hall.  She had to be put in a wheelchair and was escorted back to her room.  Patient was put on 1:1 observation per Lindon Romp, PA.  She also has a no roommate order due to psychosis.  Patient is responding to internal stimuli.  She may possibly need a 500 bed if one becomes available tomorrow.  Continue 1:1 observation for safety.

## 2016-07-27 NOTE — ED Notes (Signed)
Bed: ZSM27 Expected date:  Expected time:  Means of arrival:  Comments: [place]

## 2016-07-27 NOTE — BHH Counselor (Signed)
Clinician spoke Horris Latino, EMT and expressed the pt was seen as a walk-in at [facility] and she will have a bed tomorrow at [facility].   Vertell Novak, MS, [facility], [facility] Triage Specialist [phone]

## 2016-07-27 NOTE — Progress Notes (Signed)
Upon arrival, pt attempted to elope however security and [facility] intervened and was able to escort pt to the search room. In the search room, Nellene refused to speak but was able to cooperate with search process and was then escorted back to the unit without any further assistance, a short time later Valaree attempted to elope off of 300 hall but was re-directed back to her room without incident.

## 2016-07-28 ENCOUNTER — Encounter (HOSPITAL_COMMUNITY): Payer: Self-pay

## 2016-07-28 DIAGNOSIS — F3110 Bipolar disorder, current episode manic without psychotic features, unspecified: Secondary | ICD-10-CM

## 2016-07-28 DIAGNOSIS — G4700 Insomnia, unspecified: Secondary | ICD-10-CM

## 2016-07-28 DIAGNOSIS — R443 Hallucinations, unspecified: Secondary | ICD-10-CM

## 2016-07-28 LAB — TSH: TSH: 1.484 u[IU]/mL (ref 0.350–4.500)

## 2016-07-28 MED ORDER — OLANZAPINE 5 MG PO TBDP
ORAL_TABLET | ORAL | Status: AC
  Filled 2016-07-28: qty 1

## 2016-07-28 MED ORDER — CARBAMAZEPINE 200 MG PO TABS
200.0000 mg | ORAL_TABLET | Freq: Two times a day (BID) | ORAL | Status: DC
  Administered 2016-07-28: 200 mg via ORAL
  Filled 2016-07-28 (×3): qty 1

## 2016-07-28 MED ORDER — ENSURE ENLIVE PO LIQD
237.0000 mL | Freq: Two times a day (BID) | ORAL | Status: DC
  Administered 2016-07-29 – 2016-07-31 (×5): 237 mL via ORAL

## 2016-07-28 MED ORDER — OLANZAPINE 5 MG PO TBDP: 5.0000 mg | ORAL_TABLET | Freq: Three times a day (TID) | ORAL | Status: DC | PRN

## 2016-07-28 MED ORDER — LORAZEPAM 1 MG PO TABS
1.0000 mg | ORAL_TABLET | ORAL | Status: AC | PRN
  Administered 2016-07-28: 1 mg via ORAL
  Filled 2016-07-28: qty 1

## 2016-07-28 MED ORDER — CARBAMAZEPINE 100 MG PO CHEW
200.0000 mg | CHEWABLE_TABLET | Freq: Two times a day (BID) | ORAL | Status: DC
  Administered 2016-07-28 – 2016-08-01 (×8): 200 mg via ORAL
  Filled 2016-07-28 (×12): qty 2

## 2016-07-28 NOTE — Plan of Care (Signed)
Problem: Activity: Goal: Sleeping patterns will improve Outcome: Progressing Patient slept some on NOCs, slept in AM after breakfast/meds as well.  Problem: Education: Goal: Knowledge of [facility] Education information/materials will improve Outcome: Not Progressing Patient will need re-education after she is stabilized on medication. Goal: Mental status will improve Outcome: Not Progressing Continues to be bizarre and hyper religious, gently laid self on floor in middle of dayroom this afternoon; however, is redirectable

## 2016-07-28 NOTE — Progress Notes (Signed)
1:1 Nursing note 1000  Alert this AM with 1:1 present at arms length.  Fixed smile, behavior bizarre.  Per 1:1 staff patient periodically nonverbal and will make bizarre movements and gestures, and that this behavior was intensifying as morning progressed.  Would get up and face wall on her knees, would not respond to staff intermittently.  Other times patient verbal and pleasant.   Denied SI HI and AVH.  Very pleasant and bright.  EKG completed and reviewed.  Took medications as prescribed this AM but would only take with two cartons of white milk.  Patient fell asleep within 1 hour of administration, asleep when MD was making rounds, suggested MD return later and allow patient to sleep.  To continue to be monitored 1:1 due to high fall risk and elopement risk.

## 2016-07-28 NOTE — Progress Notes (Signed)
Patient examined after fall. States that she was falling into the arms of Jesus. Patient states that she did not hit her head. Denies any injuries, pain, dizziness. Patient is alert and oriented x3, pleasant, and cooperative. PERRL. Respirations even and unlabored. No acute distress noted. No evidence of injury noted. 1:1 observation for safety.

## 2016-07-28 NOTE — Progress Notes (Signed)
   07/28/16 0330  What Happened  Was fall witnessed? Yes  Who witnessed fall? sitter Freight forwarder, pt is currently on 1:1)  Patients activity before fall bathroom-unassisted  Point of contact other (comment) (pt fell across mattress which had been on floor)  Was patient injured? No  Follow Up  MD notified Lindon Romp NP  Time MD notified 0330  Family notified No- patient refusal (pt request that husband not be notified)  Additional tests No  Simple treatment Other (comment) (none, pt reports she does not hurt anywhere)  Progress note created (see row info) Yes  Adult Fall Risk Assessment  Risk Factor Category (scoring not indicated) High fall risk per protocol (document High fall risk)  Age 56  Fall History: Fall within 6 months prior to admission 0  Patient's Fall Risk High Fall Risk (>13 points)  Adult Fall Risk Interventions  Required Bundle Interventions *See Row Information* High fall risk - low, moderate, and high requirements implemented  Vitals  Temp 98.8 F (37.1 C) (taken at 0345)  Temp Source Oral  BP (!) 157/98  BP Location Left Arm  BP Method Automatic  Patient Position (if appropriate) Sitting  Pulse Rate 65  Pulse Rate Source Dinamap  Oxygen Therapy  SpO2 99 % (taken @ 0345)  Pain Assessment  Pain Assessment No/denies pain  Was the fall witnessed: yes   Patient condition before and after the fall: patient was asleep and got up to go to the bathroom.  Patient's reaction to the fall: She reported to sitter that she was falling into the arms of Jesus and threw herself onto the mattress. Mattress was already on the floor due to similar incident.  Writer assessed her and she had no bruising or redness. Patient reports she does not hurt anywhere.  Name of the doctor that was notified including date and time: Lindon Romp NP 07/28/16 @ 0330  Any interventions and vital signs: Gait belt, sitter already in place due to her impulsive behavior.

## 2016-07-28 NOTE — BHH Suicide Risk Assessment (Signed)
[facility] Admission Suicide Risk Assessment   Nursing information obtained from:    Demographic factors:    Current Mental Status:    Loss Factors:    Historical Factors:    Risk Reduction Factors:     Total Time spent with patient: 1.5 hours Principal Problem: <principal problem not specified> Diagnosis:   Patient Active Problem List   Diagnosis Date Noted  . Bipolar affective disorder, mixed, severe, with psychotic behavior ([place]) [F31.64] 07/27/2016  . MDD (major depressive disorder) [F32.9] 07/27/2016  . Hot flash due to medication [R23.2, T50.905A] 09/20/2014  . History of long-term use of multiple prescription drugs [Z92.29] 05/20/2014  . Malignant neoplasm of overlapping sites of left breast in female, estrogen receptor positive ([place]) [G83.662, Z17.0] 03/18/2014  . DOE (dyspnea on exertion) [R06.09] 09/14/2013  . Abnormal EKG [R94.31] 09/14/2013  . Essential tremor [G25.0] 06/05/2012  . Abnormal involuntary movements(781.0) [R25.9] 06/05/2011  . 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 Bipolar disorder ([place]) [F31.9] 01/30/2011  . Chemotherapy follow-up examination [Z09] 12/28/2010  . Fatigue [R53.83] 12/28/2010   Subjective Data: was smiling or acting bizzare and had to be sedated due to poor sleep as well  Continued Clinical Symptoms:    The "Alcohol Use Disorders Identification Test", Guidelines for Use in Primary Care, Second Edition.  World Pharmacologist [facility]). Score between 0-7:  no or low risk or alcohol related problems. Score between 8-15:  moderate risk of alcohol related problems. Score between 16-19:  high risk of alcohol related problems. Score 20 or above:  warrants further diagnostic evaluation for alcohol dependence and treatment.   CLINICAL FACTORS:   Bipolar Disorder:   Depressive phase Alcohol/Substance Abuse/Dependencies    Psychiatric Specialty Exam: Physical Exam  Review of Systems  Cardiovascular: Negative for chest pain.  Skin:  Negative for rash.  Psychiatric/Behavioral: Positive for depression and substance abuse.    Blood pressure 128/66, pulse 62, temperature 98.9 F (37.2 C), temperature source Rectal, resp. rate 16, SpO2 98 %.There is no height or weight on file to calculate BMI.  General Appearance: Casual  Eye Contact:  Minimal  Speech:  Slow  Volume:  Decreased  Mood:  Dysphoric  Affect:  Labile  Thought Process:  Disorganized  Orientation:  Full (Time, Place, and Person)  Thought Content:  Paranoid Ideation  Suicidal Thoughts:  No  Homicidal Thoughts:  No  Memory:  Did not check  Judgement:  Impaired  Insight:  Lacking  Psychomotor Activity:  Decreased  Concentration: did not check  Recall:  [employer] of Knowledge:  Fair  Language:  Fair  Akathisia:  Negative  Handed:  Right  AIMS (if indicated):     Assets:  Social Support  ADL's: decreased  Cognition:  WNL and Impaired,  Mild  Sleep:         COGNITIVE FEATURES THAT CONTRIBUTE TO RISK:  Closed-mindedness and Polarized thinking    SUICIDE RISK:   Severe:  Frequent, intense, and enduring suicidal ideation, specific plan, no subjective intent, but some objective markers of intent (i.e., choice of lethal method), the method is accessible, some limited preparatory behavior, evidence of impaired self-control, severe dysphoria/symptomatology, multiple risk factors present, and few if any protective factors, particularly a lack of social support.  PLAN OF CARE: Admit to unit for stabilization. Medication management. Compliance and safety  I certify that inpatient services furnished can reasonably be expected to improve the patient's condition.   Merian Capron, MD 07/28/2016, 11:17 AM
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
## 2016-07-28 NOTE — Progress Notes (Signed)
Adult Psychoeducational Group Note  Date:  07/28/2016 Time:  9:51 PM  Group Topic/Focus:  Wrap-Up Group:   The focus of this group is to help patients review their daily goal of treatment and discuss progress on daily workbooks.  Participation Level:  Did Not Attend  Additional Comments:  Pt did not attend group.  Vanessa Mills 07/28/2016, 9:51 PM

## 2016-07-28 NOTE — Progress Notes (Signed)
1:1 nursing note 1400 Patient awake again at 1000, sitting up quietly in bedroom with 1:1 staff.  When asked how things were patient stated "The keys to the kingdom are in the right place" when asked what she meant she stated "Jesus knows."  Smiling, anxious affect, but pleasant.  Redirectable per 1:1 staff.   Remained in room most of shift, still making bizarre movements and remarks. Remains on 1:1 for fall and elopement risk.

## 2016-07-28 NOTE — Progress Notes (Signed)
1:1 nursing note 1800  Patient continues to be bizarre but redirectable.  At one point patient laid self down gently in middle of the day room, but was able to be redirected into room.  Patient then proceeded to act bizarre in room, laid self on ground in room, and licked floor.  Received orders for PRN medicines.  Patient took Zydis but then spit it out.  Patient did take her PM medicines with a PO ativan added.  Patient then remained in her room and laid down, appeared to be asleep around 1730.  Continues to be monitored on 1:1 for fall risk and elopement risk.

## 2016-07-28 NOTE — BHH Group Notes (Signed)
[facility] Group Notes: (Clinical Social Work)   07/28/2016      Type of Therapy:  Group Therapy   Participation Level:  Did Not Attend despite MHT prompting   Selmer Dominion, LCSW 07/28/2016, 12:25 PM

## 2016-07-28 NOTE — Progress Notes (Signed)
Patient is currently sitting in the dayroom watching tv after having her vitals and lab draw this morning. Writer gave her a pitcher of water and encouraged her to keep herself hydrated. Writer informed her of the morning schedule and encouraged her not to try and leave the building today. She reports that she sometimes hears the voice of Jesus. Sitter is with her and gait belt is on for safety. Patient is safe.

## 2016-07-28 NOTE — Progress Notes (Signed)
Patient is currently lying in bed asleep with eyes closed and respirations even and unlabored. No distress noted. 1:1 continues with sitter at bedside. Patient is safe.
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
## 2016-07-28 NOTE — Progress Notes (Signed)
1:1 Nursing note- Patient is currently asleep with eyes closed and respirations even and unlabored, no distress noted. Prior to Probation officer coming on shift report was received that she had taken afternoon medications and eventually went to sleep. Patient is safe with sitter at bedside, 1:1 continues.

## 2016-07-28 NOTE — BHH Group Notes (Signed)
Patient invited to attend Nursing Education group however elected not to attend.

## 2016-07-29 DIAGNOSIS — F419 Anxiety disorder, unspecified: Secondary | ICD-10-CM

## 2016-07-29 DIAGNOSIS — F39 Unspecified mood [affective] disorder: Secondary | ICD-10-CM

## 2016-07-29 DIAGNOSIS — Z560 Unemployment, unspecified: Secondary | ICD-10-CM

## 2016-07-29 LAB — LIPID PANEL
CHOLESTEROL: 230 mg/dL — AB (ref 0–200)
HDL: 100 mg/dL (ref >40)
LDL CALC: 112 mg/dL — AB (ref 0–99)
TRIGLYCERIDES: 92 mg/dL (ref <150)
Total CHOL/HDL Ratio: 2.3 RATIO
VLDL: 18 mg/dL (ref 0–40)

## 2016-07-29 LAB — PROLACTIN: PROLACTIN: 44.6 ng/mL — AB (ref 4.8–23.3)

## 2016-07-29 LAB — HEMOGLOBIN A1C
Hgb A1c MFr Bld: 5.2 % (ref 4.8–5.6)
MEAN PLASMA GLUCOSE: 103 mg/dL

## 2016-07-29 LAB — CARBAMAZEPINE LEVEL, TOTAL: Carbamazepine Lvl: 4 ug/mL (ref 4.0–12.0)

## 2016-07-29 MED ORDER — LORAZEPAM 1 MG PO TABS
1.0000 mg | ORAL_TABLET | Freq: Once | ORAL | Status: AC | PRN
  Administered 2016-07-29: 1 mg via ORAL
  Filled 2016-07-29: qty 1

## 2016-07-29 MED ORDER — RISPERIDONE 1 MG PO TABS
1.0000 mg | ORAL_TABLET | Freq: Every day | ORAL | Status: DC
  Administered 2016-07-30 – 2016-07-31 (×2): 1 mg via ORAL
  Filled 2016-07-29 (×3): qty 1

## 2016-07-29 MED ORDER — RISPERIDONE 1 MG PO TABS
1.0000 mg | ORAL_TABLET | Freq: Every day | ORAL | Status: DC
  Administered 2016-07-29: 1 mg via ORAL
  Filled 2016-07-29 (×3): qty 1

## 2016-07-29 NOTE — Progress Notes (Signed)
1:1 Nursing note  Patient had been asleep and was lying in bed smiling inappropriately and making strange sounds with her eyes closed and would laugh inappropriately off and on. Writer called her name and she immediately opened her eyes and smiles at me as if she was not asleep. Writer asked if she was alright and if anything was bothering or hurting her and she replied that she was just thinking. Writer offered her something to eat since she slept through snack and it was reported that she had not really eaten much today. She was given a grilled chicken salad and encouraged to to eat it and drink some water. Writer came back to check on her and she was still up after eating most of her salad standing by her bed with sitter holding on to her using the gait belt. She was wanting to come to the dayroom but sitter explained to her that it was closed for the night. She continued to stand by her bed and writer encouraged her to try and get some rest. She was given 32m of ativan to help her to rest . Writer assisted her into bed and encouraged her to try and get more rest. She is now currently lying in bed asleep with eyes closed and respirations even and unlabored. No distress noted and sitter is at bedside. Patient remains safe and 1:1 continues.

## 2016-07-29 NOTE — Progress Notes (Signed)
[facility] MD Progress Note  07/29/2016 4:24 PM Vanessa Mills  MRN:  [id number]  Subjective: Vanessa Mills reports, "It was my husband that made me come here. He said that I was manic. But, I was not manic. I was having satanic attacks. And when this happens to me, I make choices that are not good. I feel good due to my own personality. The Tegretol is not helping".  Objective: Vanessa Mills is seen, chart reviewed. Discussed the case with the treatment team. She presents hypomanic. She is making good eye contact, however, her speech is some what pressured, tangential as well as circumstantial. Vanessa Mills presents frail. Her gait & balance appear week. She is on 1:1 supervision for safety & elopement prevention. She is also some what displaying some pre-occupation with religion. She admits hearing voices, unable to elaborate fully. She will be transferred to the Psychotic (locked) unit to enhance her safety. She will remain on the 1:1 supervision. Will re-evaluate tomorrow.  Principal Problem: MDD (major depressive disorder)  Diagnosis:   Patient Active Problem List   Diagnosis Date Noted  . Bipolar I disorder, most recent episode (or current) manic ([place]) [F31.10]   . Bipolar affective disorder, mixed, severe, with psychotic behavior ([place]) [F31.64] 07/27/2016  . MDD (major depressive disorder) [F32.9] 07/27/2016  . Hot flash due to medication [R23.2, T50.905A] 09/20/2014  . History of long-term use of multiple prescription drugs [Z92.29] 05/20/2014  . Malignant neoplasm of overlapping sites of left breast in female, estrogen receptor positive ([place]) [C37.628, Z17.0] 03/18/2014  . DOE (dyspnea on exertion) [R06.09] 09/14/2013  . Abnormal EKG [R94.31] 09/14/2013  . Essential tremor [G25.0] 06/05/2012  . Abnormal involuntary movements(781.0) [R25.9] 06/05/2011  . 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 Bipolar disorder ([place]) [F31.9] 01/30/2011  . Chemotherapy follow-up examination [Z09] 12/28/2010  .  Fatigue [R53.83] 12/28/2010   Total Time spent with patient: 25 minutes  Past Psychiatric History: Bipolar disorder, manic episode.  Past Medical History:  Past Medical History:  Diagnosis Date  . Bipolar 1 disorder ([place])   . Breast cancer ([place]) 03/07/2011  . Breast cancer, ILC, Left, receptor+, Her2- 10/10/2010  . Chemotherapy follow-up examination 12/28/2010  . Chronic mental illness   . Crohn's disease ([place])   . Fatigue 12/28/2010  . GERD (gastroesophageal reflux disease)    does not take medications for   . Headache(784.0)    takes midodrine for migraines prn  . Meniere's syndrome   . Mental disorder    bipolar, takes saphris at hs  . Movement disorder   . PONV (postoperative nausea and vomiting)   . Raynaud's disease   . S/P radiation therapy 05/15/11 - 07/01/11   Left Breast: 4500 cGy/25 fractions with Boost to Left chest Wall/Mastectomy Scar for Toal dose of 5940 cGy  . Status post chemotherapy    4 cycles AC  . Status post chemotherapy 01/31/11 - 04/18/11   Taxol x 12 weeks    Past Surgical History:  Procedure Laterality Date  . 2 orbital fracture surgeries    . ABDOMINAL HYSTERECTOMY     uterus removed only  . BLADDER SUSPENSION     done 2005  . BREAST SURGERY     Axillary dissection  . MASTECTOMY MODIFIED RADICAL  10/23/2010   Left Dr Margot Chimes  . PORT-A-CATH REMOVAL  08/21/2011   Procedure: REMOVAL PORT-A-CATH;  Surgeon: Haywood Lasso, MD;  Location: [facility];  Service: General;  Laterality: Right;  . PORTACATH PLACEMENT  11/28/2010   via right  subclavian - Dr Margot Chimes  . SINUS EXPLORATION     Family History:  Family History  Problem Relation Age of Onset  . Hypertension Mother   . Diabetes Mother   . Heart failure Father   . Cancer Maternal Aunt        breast, thyroid, colonm  . Heart attack Sister    Family Psychiatric  History: See H&P  Social History:  History  Alcohol Use  . 1.7 oz/week  . 2 Glasses of wine, 1 Standard drinks or  equivalent per week     History  Drug Use No    Mills: in college    Social History   Social History  . Marital status: Married    Spouse name: Vanessa Mills   . Number of children: 1  . Years of education: Masters   Occupational History  . unemployed    Social History Main Topics  . Smoking status: Never Smoker  . Smokeless tobacco: Never Used  . Alcohol use 1.7 oz/week    2 Glasses of wine, 1 Standard drinks or equivalent per week  . Drug use: No     Mills: in college  . Sexual activity: Yes     Mills: erpr+/HER-2 neg.   Other Topics Concern  . None   Social History Narrative   Married to [place]   One dtr- 56 years old- Vanessa Mills- in [place]   Patient has a Masters   Patient  has 1 child.    Additional Social History:   Sleep: Fair  Appetite:  Fair  Current Medications: Current Facility-Administered Medications  Medication Dose Route Frequency Provider Last Rate Last Dose  . acetaminophen (TYLENOL) tablet 650 mg  650 mg Oral Q4H PRN Kerrie Buffalo, NP      . alum & mag hydroxide-simeth (MAALOX/MYLANTA) 200-200-20 MG/5ML suspension 30 mL  30 mL Oral Q6H PRN Kerrie Buffalo, NP      . carbamazepine (TEGRETOL) chewable tablet 200 mg  200 mg Oral BID Hampton Abbot, MD   200 mg at 07/29/16 0750  . cholecalciferol (VITAMIN D) tablet 1,000 Units  1,000 Units Oral Daily Kerrie Buffalo, NP   1,000 Units at 07/29/16 0751  . feeding supplement (ENSURE ENLIVE) (ENSURE ENLIVE) liquid 237 mL  237 mL Oral BID BM Derrill Center, NP   237 mL at 07/29/16 1500  . hydrOXYzine (ATARAX/VISTARIL) tablet 25 mg  25 mg Oral BID Kerrie Buffalo, NP   25 mg at 07/29/16 0751  . ibuprofen (ADVIL,MOTRIN) tablet 600 mg  600 mg Oral Q8H PRN Kerrie Buffalo, NP      . magnesium hydroxide (MILK OF MAGNESIA) suspension 30 mL  30 mL Oral Daily PRN Kerrie Buffalo, NP      . OLANZapine zydis (ZYPREXA) disintegrating tablet 5 mg  5 mg Oral Q8H PRN Derrill Center, NP      . ondansetron (ZOFRAN)  tablet 4 mg  4 mg Oral Q8H PRN Kerrie Buffalo, NP      . Derrill Memo ON 07/30/2016] risperiDONE (RISPERDAL) tablet 1 mg  1 mg Oral QPC lunch Eappen, Saramma, MD      . risperiDONE (RISPERDAL) tablet 1 mg  1 mg Oral QHS Eappen, Saramma, MD      . traZODone (DESYREL) tablet 50 mg  50 mg Oral QHS Kerrie Buffalo, NP   50 mg at 07/28/16 2308    Lab Results:  Results for orders placed or performed during the hospital encounter of 07/27/16 (from the past 48 hour(s))  TSH     Status: None   Collection Time: 07/28/16  6:30 AM  Result Value Ref Range   TSH 1.484 0.350 - 4.500 uIU/mL    Mills: Performed by a 3rd Generation assay with a functional sensitivity of <=0.01 uIU/mL. Performed at [facility], [place] [address] [place], [place] [address]   Carbamazepine level, total     Status: None   Collection Time: 07/28/16  6:30 AM  Result Value Ref Range   Carbamazepine Lvl 4.0 4.0 - 12.0 ug/mL    Mills: Performed at [facility] [address], [place], [place] [address]  Prolactin     Status: Abnormal   Collection Time: 07/28/16  6:30 AM  Result Value Ref Range   Prolactin 44.6 (H) 4.8 - 23.3 ng/mL    Mills: (NOTE) Performed At: [facility] [place], [place] [id number] Lindon Romp MD [id number] Performed at [facility], [place] [address] [place], [place] [address]   Hemoglobin A1c     Status: None   Collection Time: 07/28/16  6:30 AM  Result Value Ref Range   Hgb A1c MFr Bld 5.2 4.8 - 5.6 %    Mills: (NOTE)         Pre-diabetes: 5.7 - 6.4         Diabetes: >6.4         Glycemic control for adults with diabetes: <7.0    Mean Plasma Glucose 103 mg/dL    [place]ills: (NOTE) Performed At: [facility] [place], [place] [id number] Lindon Romp MD KG:[id number] Performed at [facility], [place] [address] [place], [place] [address]   Lipid panel     Status: Abnormal    Collection Time: 07/29/16  6:20 AM  Result Value Ref Range   Cholesterol 230 (H) 0 - 200 mg/dL   Triglycerides 92 <150 mg/dL   HDL 100 >40 mg/dL   Total CHOL/HDL Ratio 2.3 RATIO   VLDL 18 0 - 40 mg/dL   LDL Cholesterol 112 (H) 0 - 99 mg/dL    Mills:        Total Cholesterol/HDL:CHD Risk Coronary Heart Disease Risk Table                     Men   Women  1/2 Average Risk   3.4   3.3  Average Risk       5.0   4.4  2 X Average Risk   9.6   7.1  3 X Average Risk  23.4   11.0        Use the calculated Patient Ratio above and the CHD Risk Table to determine the patient's CHD Risk.        ATP III CLASSIFICATION (LDL):  <100     mg/dL   Optimal  100-129  mg/dL   Near or Above                    Optimal  130-159  mg/dL   Borderline  160-189  mg/dL   High  >190     mg/dL   Very High Performed at [facility] [address] [place], [place] [address]    Blood Alcohol level:  Lab Results  Component Value Date   ETH <5 07/26/2016   ETH (H) 05/19/2010    69        LOWEST DETECTABLE LIMIT FOR SERUM ALCOHOL IS  5 mg/dL FOR MEDICAL PURPOSES ONLY   Metabolic Disorder Labs: Lab Results  Component Value Date   HGBA1C 5.2 07/28/2016   MPG 103 07/28/2016   Lab Results  Component Value Date   PROLACTIN 44.6 (H) 07/28/2016   Lab Results  Component Value Date   CHOL 230 (H) 07/29/2016   TRIG 92 07/29/2016   HDL 100 07/29/2016   CHOLHDL 2.3 07/29/2016   VLDL 18 07/29/2016   LDLCALC 112 (H) 07/29/2016   LDLCALC 111 (H) 03/04/2013   Physical Findings: AIMS: Facial and Oral Movements Muscles of Facial Expression: None, normal Lips and Perioral Area: None, normal Jaw: None, normal Tongue: None, normal,Extremity Movements Upper (arms, wrists, hands, fingers): None, normal Lower (legs, knees, ankles, toes): None, normal, Trunk Movements Neck, shoulders, hips: None, normal, Overall Severity Severity of abnormal movements (highest score from questions above): None,  normal Incapacitation due to abnormal movements: None, normal Patient's awareness of abnormal movements (rate only patient's report): No Awareness, Dental Status Current problems with teeth and/or dentures?: No Does patient usually wear dentures?: No  CIWA:    COWS:     Musculoskeletal: Strength & Muscle Tone: within normal limits Gait & Station: Weak, staggers Patient leans: N/A  Psychiatric Specialty Exam: Physical Exam: Nurses notes & Vital signs reviewed.  Review of Systems  Psychiatric/Behavioral: Positive for depression, hallucinations (Psychotic) and memory loss. Negative for substance abuse and suicidal ideas. The patient is nervous/anxious and has insomnia.     Blood pressure 115/73, pulse 63, temperature 98.5 F (36.9 C), temperature source Oral, resp. rate 16, height 5' 7"  (1.702 m), weight 54.9 kg (121 lb), SpO2 98 %.Body mass index is 18.95 kg/m.  General Appearance: Disheveled and Guarded placed on 1:1 for bazaar behavior  Eye Contact:  Fair  Speech:  Clear and Coherent  Volume:  Decreased thought blocking   Mood:  Anxious, Depressed and Dysphoric  Affect:  Blunt, Depressed and Flat  Thought Process:  Coherent and Descriptions of Associations: Intact  Orientation:  Full (Time, Place, and Person)  Thought Content:  Hallucinations: Auditory Visual  Suicidal Thoughts:  No  Homicidal Thoughts:  No  Memory:  Recent;   Poor Remote;   Poor  Judgement:  Fair  Insight:  Fair  Psychomotor Activity:  Restlessness and Shuffling Gait  Concentration:  Concentration: Poor and Attention Span: Poor  Recall:  [employer] of Knowledge:  Fair  Language:  Good  Akathisia:  No  Handed:  Right  AIMS (if indicated):     Assets:  Communication Skills Desire for Improvement Resilience Social Support  ADL's:  Intact  Cognition:  WNL  Sleep: 4-25       Treatment Plan Summary: Patient remains manic. She presents with evidence of psychosis & hypomania. She is at risks for fall  due to generalized weakness.    Psychiatric: Bipolar disorder, current episode, manic.  Medical: Will continue monitor for any symptoms & treat on a prn basis.  Psychosocial:  Mania Will encourage group counseling attendance & participation when appropriate.[name]  PLAN: 1.07-29-16: No changes made on the current plan of care, continue current regimen as recommended.  Anxiety/agitation:  Continue Hydroxyzine 25 mg Q 6 hours prn.  Mood control: Continue Risperdal 1 mg Q hs.  Mood Stabilization:  Will continue Tegretol (chewable) 200 mg Bid.  Continue the 1:1 supervision for safety, re-evaluate tomorrow.  2. Continue to monitor mood, behavior and interaction with peers  Lindell Spar I, NP, PMHNP, FNP-BC. 07/29/2016, 4:24 PM

## 2016-07-29 NOTE — Progress Notes (Signed)
1:1 nursing note 1400  Patient woke up later in AM, was moved to 500 hallway once a bed opened up per CN and NP.  Tolerated move well.  Appetite improved- ate most of breakfast (eggs, grits) half of ensure supplement, and about 90% of salad with chicken for lunch.  Endorses religious delusions that the reason she came in was because of "satanic attacks," responses usually appropriate to questions.  Attending groups. Did admit to auditory hallucinations intermittently but none today.  No falling behaviors observed, steady on feet, behavior less bizarre (no licking the floor, no strange gestures and body movements observed). Spoke with NP/MD about patient's improvement- due to move to new unit and patient being new on floor, plan of care to continue on 1:1 for safety through the night and discuss maybe transitioning off 1:1 tomorrow.

## 2016-07-29 NOTE — Progress Notes (Signed)
1:1 Nursing Note (late entry) Patient is up for blood draw this am. When writer went to speak to her she had her bra on the outside of her shirt and 2 mismatched shoes. Writer went to her room and helped her to choose one pair of shoes to wear and to take her bra off since already having one on properly. She had torn up several sheets out of a magazine and had them over her floor. Writer gave her some toiletries and encouraged her to comb her hair and maybe come to the dayroom and watch a little news which she did. Patient remains safe and 1:1 continues.

## 2016-07-29 NOTE — Progress Notes (Signed)
1:1 nursing note 1000  Patient very pleasant, still somewhat bizarre with movements and speech but did come out into dayroom and watch some TV today.  Took medicines without issue.  Patient fell asleep within 1 hour of AM medication administration- MD notified and new orders received.  Denies SI, HI, and AVH, no religious delusions verbalized this AM.  Appetite improved, ate 100% of breakfast, drank an ensure shake.  Per husband patient may be selective of healthier food items (fruits, salads, etc) so these will be provided.  Continues on 1:1 for elopement and fall risk.

## 2016-07-29 NOTE — B[facility] Group Notes (Signed)
[place] LCSW Group Therapy  07/29/2016 1:15 pm  Type of Therapy: Process Group Therapy  Participation Level:  Active  Participation Quality:  Appropriate  Affect:  Flat  Cognitive:  Oriented  Insight:  Improving  Engagement in Group:  Limited  Engagement in Therapy:  Limited  Modes of Intervention:  Activity, Clarification, Education, Problem-solving and Support  Summary of Progress/Problems: Today's group addressed the issue of overcoming obstacles.  Patients were asked to identify their biggest obstacle post d/c that stands in the way of their on-going success, and then problem solve as to how to manage this. Stayed the entire time, engaged throughout.  "My biggest obstacle is the food here.  There is not the variety I need to eat healthy.  But it is temporary."  No other contributions.  Trish Mage 07/29/2016   4:23 PM

## 2016-07-29 NOTE — Progress Notes (Signed)
1:1 nursing note 1800 Patient continued to do well this afternoon.  Attended groups, pleasant.  No bizarre movements observed; however, at on point per 1:1 staff patient was asking where the exit was because she stated she needed to get out of here today.  She was redirected and no further behavior like this was observed.  Ate well for dinner and wrote "Jesus" on the styrofoam box it came in.  Remains on 1:1 per MD treatment plan for fall precautions and elopement risk.

## 2016-07-30 DIAGNOSIS — F3164 Bipolar disorder, current episode mixed, severe, with psychotic features: Principal | ICD-10-CM

## 2016-07-30 LAB — HEMOGLOBIN A1C
Hgb A1c MFr Bld: 5.2 % (ref 4.8–5.6)
Mean Plasma Glucose: 103 mg/dL

## 2016-07-30 LAB — PROLACTIN: Prolactin: 68 ng/mL — ABNORMAL HIGH (ref 4.8–23.3)

## 2016-07-30 MED ORDER — BENZTROPINE MESYLATE 0.5 MG PO TABS
0.5000 mg | ORAL_TABLET | Freq: Every day | ORAL | Status: DC
  Administered 2016-07-31: 0.5 mg via ORAL
  Filled 2016-07-30 (×2): qty 1

## 2016-07-30 MED ORDER — BENZTROPINE MESYLATE 0.5 MG PO TABS
0.5000 mg | ORAL_TABLET | Freq: Every day | ORAL | Status: DC
  Administered 2016-07-30 – 2016-08-05 (×7): 0.5 mg via ORAL
  Filled 2016-07-30 (×11): qty 1

## 2016-07-30 MED ORDER — RISPERIDONE 0.5 MG PO TABS
1.5000 mg | ORAL_TABLET | Freq: Every day | ORAL | Status: DC
  Administered 2016-07-30: 1.5 mg via ORAL
  Filled 2016-07-30 (×3): qty 3

## 2016-07-30 NOTE — Progress Notes (Signed)
Patient's self inventory sheet, patient sleeps good, no sleep medication given.  Good appetite, normal energy level, OK concentration.  Denied depression, hopeless and anxiety.  Denied withdrawals.  Wants to be happy.  Denied SI.  Denied physical problem.  Physical pain, sternum, worst pain in past 24 hours is #2, no pain medicine.  Goal is to see, hear and touch Jesus.  Plans to ask questions.  No discharge plans.

## 2016-07-30 NOTE — Plan of Care (Signed)
Problem: Education: Goal: Emotional status will improve Outcome: Progressing Nurse discussed depression/anxiety/coping skills with patient.

## 2016-07-30 NOTE — Progress Notes (Signed)
Patient denied SI and HI.  Denied A/V hallucinations.  Denied pain.  Patient refused scheduled vistaril at 1700, stated she did not need this medication.  Respirations even and unlabored.  No signs/symptoms of pain/distress noted on patient's face/body movements.  Safety maintained with 15 minute checks.

## 2016-07-30 NOTE — Progress Notes (Signed)
1:1 Note 0800 Patient walking in hall with 1:1 present for safety.  Patient denied SI and HI.  Denied A/V hallucinations.  Patient has talked to staff and smiling this morning.  Respirations even and unlabored.  No signs/symptoms of pain/distress noted on patient's face/body movements.  1:1 continues per MD order.

## 2016-07-30 NOTE — Progress Notes (Signed)
[facility] MD Progress Note  07/30/2016 3:18 PM Vanessa Mills  MRN:  [id number] Subjective:  Patient states " I am thinking better.'  Objective;Patient seen and chart reviewed.Discussed patient with treatment team.  Pt today seen as less anxious , cooperative. She reports she is less confused and her thoughts are better. Pt reports she slept better last night. She has a hx of bipolar do , treated by Dr.Kaur on an outpatient basis. On tegretol and risperidone. She denies any ADRs. Per RN , she is able to be redirected and is calm., hence will take her off of the 1:1 precaution and observe.     Principal Problem: Bipolar affective disorder, mixed, severe, with psychotic behavior ([place]) Diagnosis:   Patient Active Problem List   Diagnosis Date Noted  . Bipolar affective disorder, mixed, severe, with psychotic behavior ([place]) [F31.64] 07/30/2016  . Hot flash due to medication [R23.2, T50.905A] 09/20/2014  . History of long-term use of multiple prescription drugs [Z92.29] 05/20/2014  . Malignant neoplasm of overlapping sites of left breast in female, estrogen receptor positive ([place]) [N82.956, Z17.0] 03/18/2014  . DOE (dyspnea on exertion) [R06.09] 09/14/2013  . Abnormal EKG [R94.31] 09/14/2013  . Essential tremor [G25.0] 06/05/2012  . Abnormal involuntary movements(781.0) [R25.9] 06/05/2011  . 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 Bipolar disorder ([place]) [F31.9] 01/30/2011  . Chemotherapy follow-up examination [Z09] 12/28/2010  . Fatigue [R53.83] 12/28/2010   Total Time spent with patient: 25 minutes  Past Psychiatric History: Please see H&P.   Past Medical History:  Past Medical History:  Diagnosis Date  . Bipolar 1 disorder ([place])   . Breast cancer ([place]) 03/07/2011  . Breast cancer, ILC, Left, receptor+, Her2- 10/10/2010  . Chemotherapy follow-up examination 12/28/2010  . Chronic mental illness   . Crohn's disease ([place])   . Fatigue 12/28/2010  . GERD (gastroesophageal reflux  disease)    does not take medications for   . Headache(784.0)    takes midodrine for migraines prn  . Meniere's syndrome   . Mental disorder    bipolar, takes saphris at hs  . Movement disorder   . PONV (postoperative nausea and vomiting)   . Raynaud's disease   . S/P radiation therapy 05/15/11 - 07/01/11   Left Breast: 4500 cGy/25 fractions with Boost to Left chest Wall/Mastectomy Scar for Toal dose of 5940 cGy  . Status post chemotherapy    4 cycles AC  . Status post chemotherapy 01/31/11 - 04/18/11   Taxol x 12 weeks    Past Surgical History:  Procedure Laterality Date  . 2 orbital fracture surgeries    . ABDOMINAL HYSTERECTOMY     uterus removed only  . BLADDER SUSPENSION     done 2005  . BREAST SURGERY     Axillary dissection  . MASTECTOMY MODIFIED RADICAL  10/23/2010   Left Dr Margot Chimes  . PORT-A-CATH REMOVAL  08/21/2011   Procedure: REMOVAL PORT-A-CATH;  Surgeon: Haywood Lasso, MD;  Location: [place];  Service: General;  Laterality: Right;  . PORTACATH PLACEMENT  11/28/2010   via right subclavian - Dr Margot Chimes  . SINUS EXPLORATION     Family History:  Family History  Problem Relation Age of Onset  . Hypertension Mother   . Diabetes Mother   . Heart failure Father   . Cancer Maternal Aunt        breast, thyroid, colonm  . Heart attack Sister    Family Psychiatric  History: Please see H&P.  Social History:  History  Alcohol Use  . 1.7 oz/week  . 2 Glasses of wine, 1 Standard drinks or equivalent per week     History  Drug Use No    Comment: in college    Social History   Social History  . Marital status: Married    Spouse name: Linna Hoff   . Number of children: 1  . Years of education: Masters   Occupational History  . unemployed    Social History Main Topics  . Smoking status: Never Smoker  . Smokeless tobacco: Never Used  . Alcohol use 1.7 oz/week    2 Glasses of wine, 1 Standard drinks or equivalent per week  . Drug use: No      Comment: in college  . Sexual activity: Yes     Comment: erpr+/HER-2 neg.   Other Topics Concern  . None   Social History Narrative   Married to [place]   One dtr- 58 years old- [name]- in [place]   Patient has a Masters   Patient  has 1 child.    Additional Social History:                         Sleep: Fair  Appetite:  Fair  Current Medications: Current Facility-Administered Medications  Medication Dose Route Frequency Provider Last Rate Last Dose  . acetaminophen (TYLENOL) tablet 650 mg  650 mg Oral Q4H PRN Kerrie Buffalo, NP      . alum & mag hydroxide-simeth (MAALOX/MYLANTA) 200-200-20 MG/5ML suspension 30 mL  30 mL Oral Q6H PRN Kerrie Buffalo, NP      . carbamazepine (TEGRETOL) chewable tablet 200 mg  200 mg Oral BID Hampton Abbot, MD   200 mg at 07/30/16 5009  . cholecalciferol (VITAMIN D) tablet 1,000 Units  1,000 Units Oral Daily Kerrie Buffalo, NP   1,000 Units at 07/30/16 0846  . feeding supplement (ENSURE ENLIVE) (ENSURE ENLIVE) liquid 237 mL  237 mL Oral BID BM Derrill Center, NP   237 mL at 07/30/16 1418  . hydrOXYzine (ATARAX/VISTARIL) tablet 25 mg  25 mg Oral BID Kerrie Buffalo, NP   25 mg at 07/30/16 3818  . ibuprofen (ADVIL,MOTRIN) tablet 600 mg  600 mg Oral Q8H PRN Kerrie Buffalo, NP      . magnesium hydroxide (MILK OF MAGNESIA) suspension 30 mL  30 mL Oral Daily PRN Kerrie Buffalo, NP      . OLANZapine zydis (ZYPREXA) disintegrating tablet 5 mg  5 mg Oral Q8H PRN Derrill Center, NP      . ondansetron (ZOFRAN) tablet 4 mg  4 mg Oral Q8H PRN Kerrie Buffalo, NP      . risperiDONE (RISPERDAL) tablet 1 mg  1 mg Oral QPC lunch Ursula Alert, MD   1 mg at 07/30/16 1417  . risperiDONE (RISPERDAL) tablet 1.5 mg  1.5 mg Oral QHS Jayline Kilburg, MD      . traZODone (DESYREL) tablet 50 mg  50 mg Oral QHS Kerrie Buffalo, NP   50 mg at 07/29/16 2208    Lab Results:  Results for orders placed or performed during the hospital encounter of 07/27/16  (from the past 48 hour(s))  Hemoglobin A1c     Status: None   Collection Time: 07/29/16  6:20 AM  Result Value Ref Range   Hgb A1c MFr Bld 5.2 4.8 - 5.6 %    Comment: (NOTE)         Pre-diabetes: 5.7 - 6.4  Diabetes: >6.4         Glycemic control for adults with diabetes: <7.0    Mean Plasma Glucose 103 mg/dL    Comment: (NOTE) Performed At: [facility] [place], [place] [id number] Lindon Romp MD IR:[id number] Performed at [facility], [place] [address] [place], [place] [address]   Lipid panel     Status: Abnormal   Collection Time: 07/29/16  6:20 AM  Result Value Ref Range   Cholesterol 230 (H) 0 - 200 mg/dL   Triglycerides 92 <150 mg/dL   HDL 100 >40 mg/dL   Total CHOL/HDL Ratio 2.3 RATIO   VLDL 18 0 - 40 mg/dL   LDL Cholesterol 112 (H) 0 - 99 mg/dL    Comment:        Total Cholesterol/HDL:CHD Risk Coronary Heart Disease Risk Table                     Men   Women  1/2 Average Risk   3.4   3.3  Average Risk       5.0   4.4  2 X Average Risk   9.6   7.1  3 X Average Risk  23.4   11.0        Use the calculated Patient Ratio above and the CHD Risk Table to determine the patient's CHD Risk.        ATP III CLASSIFICATION (LDL):  <100     mg/dL   Optimal  100-129  mg/dL   Near or Above                    Optimal  130-159  mg/dL   Borderline  160-189  mg/dL   High  >190     mg/dL   Very High Performed at [facility] [address] [place], [place] [address]   Prolactin     Status: Abnormal   Collection Time: 07/29/16  6:20 AM  Result Value Ref Range   Prolactin 68.0 (H) 4.8 - 23.3 ng/mL    Comment: (NOTE) Performed At: [facility] [place], [place] [id number] Lindon Romp MD RK:[id number] Performed at [facility], [place] [address], [place], [place] [address]     Blood Alcohol level:  Lab Results  Component Value Date   ETH <5 07/26/2016   ETH (H)  05/19/2010    69        LOWEST DETECTABLE LIMIT FOR SERUM ALCOHOL IS 5 mg/dL FOR MEDICAL PURPOSES ONLY    Metabolic Disorder Labs: Lab Results  Component Value Date   HGBA1C 5.2 07/29/2016   MPG 103 07/29/2016   MPG 103 07/28/2016   Lab Results  Component Value Date   PROLACTIN 68.0 (H) 07/29/2016   PROLACTIN 44.6 (H) 07/28/2016   Lab Results  Component Value Date   CHOL 230 (H) 07/29/2016   TRIG 92 07/29/2016   HDL 100 07/29/2016   CHOLHDL 2.3 07/29/2016   VLDL 18 07/29/2016   LDLCALC 112 (H) 07/29/2016   LDLCALC 111 (H) 03/04/2013    Physical Findings: AIMS: Facial and Oral Movements Muscles of Facial Expression: None, normal Lips and Perioral Area: None, normal Jaw: None, normal Tongue: None, normal,Extremity Movements Upper (arms, wrists, hands, fingers): None, normal Lower (legs, knees, ankles, toes): None, normal, Trunk Movements Neck, shoulders, hips: None, normal, Overall Severity Severity of abnormal movements (highest score from questions above): None, normal Incapacitation due to  abnormal movements: None, normal Patient's awareness of abnormal movements (rate only patient's report): No Awareness, Dental Status Current problems with teeth and/or dentures?: No Does patient usually wear dentures?: No  CIWA:  CIWA-Ar Total: 0 COWS:  COWS Total Score: 1  Musculoskeletal: Strength & Muscle Tone: within normal limits Gait & Station: normal Patient leans: N/A  Psychiatric Specialty Exam: Physical Exam  Nursing note and vitals reviewed.   Review of Systems  Psychiatric/Behavioral: Positive for depression and hallucinations. The patient is nervous/anxious.   All other systems reviewed and are negative.   Blood pressure 130/80, pulse 74, temperature 99 F (37.2 C), temperature source Oral, resp. rate 16, height 5' 7"  (1.702 m), weight 54.9 kg (121 lb), SpO2 98 %.Body mass index is 18.95 kg/m.  General Appearance: Guarded  Eye Contact:  Fair  Speech:   Normal Rate  Volume:  Normal  Mood:  Anxious  Affect:  Congruent  Thought Process:  Goal Directed and Descriptions of Associations: Circumstantial  Orientation:  Full (Time, Place, and Person)  Thought Content:  Delusions and Hallucinations: Auditory  Suicidal Thoughts:  No  Homicidal Thoughts:  No  Memory:  Immediate;   Fair Recent;   Fair Remote;   Fair  Judgement:  Impaired  Insight:  Shallow  Psychomotor Activity:  Normal  Concentration:  Concentration: Fair and Attention Span: Fair  Recall:  [employer] of Knowledge:  Fair  Language:  Fair  Akathisia:  No  Handed:  Right  AIMS (if indicated):     Assets:  Communication Skills Desire for Improvement  ADL's:  Intact  Cognition:  WNL  Sleep:  Number of Hours: 6.25   Bipolar affective disorder, mixed, severe, with psychotic behavior ([place]) unstable  Will continue today 07/30/16  plan as below except where it is noted.    Treatment Plan Summary:Patient with bipolar do , presents today as making progress, continues to have AH , however is less anxious and labile ,continue treatment. Daily contact with patient to assess and evaluate symptoms and progress in treatment and Medication management Reviewed past medical records,treatment plan.  Tegretol 200 mg po bid for mood sx. Tegretol level on 08/01/2016. Increase Risperidone to 1 mg po qnoon and 1.5 mg po qhs.for psychosis/mood. Cogentin 0.5 mg po bid for eps. Will continue to monitor vitals ,medication compliance and treatment side effects while patient is here.  Will monitor for medical issues as well as call consult as needed.  Reviewed labs ,PL elevated - will monitor . Will discontinue 1;1 precaution for safety. CSW will continue working on disposition.  Patient to participate in therapeutic milieu .      Josten Warmuth, MD 07/30/2016, 3:18 PM

## 2016-07-30 NOTE — Progress Notes (Signed)
1:1 Note 1000 Patient's 1:1 has been discontinued by MD.  Patient sitting in dayroom.  Patient denied SI and HI.  Denied A/V hallucinations.  Patient denied pain.  No signs/symptoms of pain/distress noted on patient's face/body movements.  Patient ate 100% breakfast.

## 2016-07-30 NOTE — Progress Notes (Signed)
D: Pt was pleasant, but anxious during the assessment. Pt would only answer questions and had no questions or concerns for the writer. Pt wouldn't go into detail when answering questions. Pt was oriented X4. Pt has no questions or concerns.    A:  Support and encouragement was offered. 1:1continued for safety.  R: Pt remains safe.

## 2016-07-30 NOTE — Progress Notes (Signed)
[facility] 1:1 Observation Documentation  For the first (8) hours following discontinuation of 1:1 precautions, a progress note entry by nursing staff should be documented at least every 2 hours, reflecting the patient's behavior, condition, mood, and conversation.  Use the progress notes for additional entries.  Time 1:1 discontinued:   0955  Patient's Behavior:   Appropriate, cooperative, alert  Patient's Condition:  Healthy, alert, oriented  Patient's Conversation:  Denied SI and HI.  Denied A/V hallucinations.  Denied pain.   Presently laying in bed with eyes closed resting.   Cammy Copa 07/30/2016, 3:05 PM

## 2016-07-30 NOTE — Progress Notes (Signed)
Vanessa Mills 1:1 Observation Documentation  For the first (8) hours following discontinuation of 1:1 precautions, a progress note entry by nursing staff should be documented at least every 2 hours, reflecting the patient's behavior, condition, mood, and conversation.  Use the progress notes for additional entries.  Time 1:1 discontinued:  0955  Patient's Behavior:  Cooperative, pleasant, calm  Patient's Condition:   Healthy, denied pain  Patient's Conversation:   Patient sitting on side of her bed in her room.  Patient denied SI and HI.  Denied A/V hallucinations.  Denied pain.  Respirations even and unlabored.  No signs/symptoms of pain/distress noted on patient's face/body movements.  Patient has been calm and cooperative today.  Safety maintained with 15 minute checks.   Cammy Copa 07/30/2016, 4:11 PM

## 2016-07-30 NOTE — Progress Notes (Signed)
   D:Pt laying in bed resting with eyes closed. Respirations even and unlabored. No distress noted. A: Monitor 1:1 for safety. R: Pt remains safe.

## 2016-07-30 NOTE — Progress Notes (Signed)
[facility] 1:1 Observation Documentation  For the first (8) hours following discontinuation of 1:1 precautions, a progress note entry by nursing staff should be documented at least every 2 hours, reflecting the patient's behavior, condition, mood, and conversation.  Use the progress notes for additional entries.  Time 1:1 discontinued:  0955   Patient's Behavior:  Appropriate, calm, cooperative  Patient's Condition:  Healthy, no complaints of pain/discomfort.  Patient's Conversation:  Patient denied SI and HI, contracts for safety.  Denied A/V hallucinations.  Denied pain. Attended morning group.    Cammy Copa 07/30/2016, 12:40 PM

## 2016-07-30 NOTE — Progress Notes (Signed)
[facility] 1:1 Observation Documentation  For the first (8) hours following discontinuation of 1:1 precautions, a progress note entry by nursing staff should be documented at least every 2 hours, reflecting the patient's behavior, condition, mood, and conversation.  Use the progress notes for additional entries.  Time 1:1 discontinued:  0955  Patient's Behavior:  Alert, oriented  Patient's Condition:  Denied pain.    Patient's Conversation:   Patient refused vistaril at 1700, stated she did not need this medication.  Will discuss meds with MD tomorrow.  Patient denied SI and HI.  Denied A/V hallucinations.  Denied pain.  Cammy Copa 07/30/2016, 5:57 PM

## 2016-07-30 NOTE — Progress Notes (Signed)
Patient went to dining room for lunch.  Patient denied SI and HI, contracts for safety.  Denied A/V hallucinations.  Denied pain.  Patient attended morning group.  1:1 was discontinued by MD.  Safety maintained with 15 minute checks.

## 2016-07-30 NOTE — [facility] Suicide Risk Assessment (Signed)
[place] INPATIENT:  Family/Significant Other Suicide Prevention Education  Suicide Prevention Education:  Education Completed; No one, has been identified by the patient as the family member/significant other with whom the patient will be residing, and identified as the person(s) who will aid the patient in the event of a mental health crisis (suicidal ideations/suicide attempt).  With written consent from the patient, the family member/significant other has been provided the following suicide prevention education, prior to the and/or following the discharge of the patient.  The suicide prevention education provided includes the following:  Suicide risk factors  Suicide prevention and interventions  National Suicide Hotline telephone number  [facility] assessment telephone number  [facility] Emergency Assistance [place] and/or Residential Mobile Crisis Unit telephone number  Request made of family/significant other to:  Remove weapons (e.g., guns, rifles, knives), all items previously/currently identified as safety concern.    Remove drugs/medications (over-the-counter, prescriptions, illicit drugs), all items previously/currently identified as a safety concern.  The family member/significant other verbalizes understanding of the suicide prevention education information provided.  The family member/significant other agrees to remove the items of safety concern listed above.  Pt was not suicidal on admission or throughout her stay here. However, CSW did speak with husband to obtain collateral. Pt's husband reports this is a cycle, Pt will stop taking her medication and become manic. Pt is supportive of outpatient treatment and hoping to get Pt in with a therapist.   [place] Lighter 07/30/2016, 10:41 AM

## 2016-07-30 NOTE — Progress Notes (Signed)
  D:Pt laying in bed resting with eyes closed. Respirations even and unlabored. No distress noted. A: Monitor 1:1 for safety. R: Pt remains safe.

## 2016-07-30 NOTE — Tx Team (Signed)
Interdisciplinary Treatment and Diagnostic Plan Update  07/30/2016 Time of Session: 9:30am Vanessa Mills MRN: [id number]  Principal Diagnosis: MDD (major depressive disorder)  Secondary Diagnoses: Principal Problem:   MDD (major depressive disorder) Active Problems:   Bipolar I disorder, most recent episode (or current) manic ([place])   Current Medications:  Current Facility-Administered Medications  Medication Dose Route Frequency Provider Last Rate Last Dose  . acetaminophen (TYLENOL) tablet 650 mg  650 mg Oral Q4H PRN Kerrie Buffalo, NP      . alum & mag hydroxide-simeth (MAALOX/MYLANTA) 200-200-20 MG/5ML suspension 30 mL  30 mL Oral Q6H PRN Kerrie Buffalo, NP      . carbamazepine (TEGRETOL) chewable tablet 200 mg  200 mg Oral BID Hampton Abbot, MD   200 mg at 07/30/16 7903  . cholecalciferol (VITAMIN D) tablet 1,000 Units  1,000 Units Oral Daily Kerrie Buffalo, NP   1,000 Units at 07/30/16 0846  . feeding supplement (ENSURE ENLIVE) (ENSURE ENLIVE) liquid 237 mL  237 mL Oral BID BM Derrill Center, NP   237 mL at 07/29/16 1500  . hydrOXYzine (ATARAX/VISTARIL) tablet 25 mg  25 mg Oral BID Kerrie Buffalo, NP   25 mg at 07/30/16 8333  . ibuprofen (ADVIL,MOTRIN) tablet 600 mg  600 mg Oral Q8H PRN Kerrie Buffalo, NP      . magnesium hydroxide (MILK OF MAGNESIA) suspension 30 mL  30 mL Oral Daily PRN Kerrie Buffalo, NP      . OLANZapine zydis (ZYPREXA) disintegrating tablet 5 mg  5 mg Oral Q8H PRN Derrill Center, NP      . ondansetron (ZOFRAN) tablet 4 mg  4 mg Oral Q8H PRN Kerrie Buffalo, NP      . risperiDONE (RISPERDAL) tablet 1 mg  1 mg Oral QPC lunch Eappen, Saramma, MD      . risperiDONE (RISPERDAL) tablet 1 mg  1 mg Oral QHS Eappen, [name] Clock, MD   1 mg at 07/29/16 2208  . traZODone (DESYREL) tablet 50 mg  50 mg Oral QHS Kerrie Buffalo, NP   50 mg at 07/29/16 2208    PTA Medications: Prescriptions Prior to Admission  Medication Sig Dispense Refill Last Dose  . ALPRAZolam  (XANAX) 0.5 MG tablet Take 0.5 mg by mouth at bedtime as needed for anxiety.   07/25/2016 at Unknown time  . carbamazepine (TEGRETOL) 100 MG chewable tablet Chew 100 mg by mouth 2 (two) times daily.  0 07/26/2016 at Unknown time  . cholecalciferol (VITAMIN D) 1000 units tablet Take 1 tablet (1,000 Units total) by mouth daily. 100 tablet 6 Past Week at Unknown time  . hydrALAZINE (APRESOLINE) 25 MG tablet Take 1 tablet (25 mg total) by mouth 3 (three) times daily. (Patient not taking: Reported on 07/26/2016) 90 tablet 11 Not Taking at Unknown time  . letrozole (FEMARA) 2.5 MG tablet TAKE 1 TABLET(2.5 MG) BY MOUTH DAILY (Patient not taking: Reported on 07/26/2016) 30 tablet 11 Not Taking at Unknown time    Treatment Modalities: Medication Management, Group therapy, Case management,  1 to 1 session with clinician, Psychoeducation, Recreational therapy.  Patient Stressors:    Patient Strengths:    Physician Treatment Plan for Primary Diagnosis: MDD (major depressive disorder) Long Term Goal(s): Improvement in symptoms so as ready for discharge  Short Term Goals: Ability to identify changes in lifestyle to reduce recurrence of condition will improve Ability to verbalize feelings will improve Ability to demonstrate self-control will improve Ability to identify and develop effective coping behaviors will improve  Compliance with prescribed medications will improve  Medication Management: Evaluate patient's response, side effects, and tolerance of medication regimen.  Therapeutic Interventions: 1 to 1 sessions, Unit Group sessions and Medication administration.  Evaluation of Outcomes: Not Met  Physician Treatment Plan for Secondary Diagnosis: Principal Problem:   MDD (major depressive disorder) Active Problems:   Bipolar I disorder, most recent episode (or current) manic ([place])   Long Term Goal(s): Improvement in symptoms so as ready for discharge  Short Term Goals: Ability to identify  changes in lifestyle to reduce recurrence of condition will improve Ability to verbalize feelings will improve Ability to demonstrate self-control will improve Ability to identify and develop effective coping behaviors will improve Compliance with prescribed medications will improve  Medication Management: Evaluate patient's response, side effects, and tolerance of medication regimen.  Therapeutic Interventions: 1 to 1 sessions, Unit Group sessions and Medication administration.  Evaluation of Outcomes: Not Met   RN Treatment Plan for Primary Diagnosis: MDD (major depressive disorder) Long Term Goal(s): Knowledge of disease and therapeutic regimen to maintain health will improve  Short Term Goals: Ability to verbalize feelings will improve, Ability to disclose and discuss suicidal ideas and Ability to identify and develop effective coping behaviors will improve  Medication Management: RN will administer medications as ordered by provider, will assess and evaluate patient's response and provide education to patient for prescribed medication. RN will report any adverse and/or side effects to prescribing provider.  Therapeutic Interventions: 1 on 1 counseling sessions, Psychoeducation, Medication administration, Evaluate responses to treatment, Monitor vital signs and CBGs as ordered, Perform/monitor CIWA, COWS, AIMS and Fall Risk screenings as ordered, Perform wound care treatments as ordered.  Evaluation of Outcomes: Not Met   LCSW Treatment Plan for Primary Diagnosis: MDD (major depressive disorder) Long Term Goal(s): Safe transition to appropriate next level of care at discharge, Engage patient in therapeutic group addressing interpersonal concerns.  Short Term Goals: Engage patient in aftercare planning with referrals and resources, Identify triggers associated with mental health/substance abuse issues and Increase skills for wellness and recovery  Therapeutic Interventions: Assess  for all discharge needs, 1 to 1 time with Social worker, Explore available resources and support systems, Assess for adequacy in community support network, Educate family and significant other(s) on suicide prevention, Complete Psychosocial Assessment, Interpersonal group therapy.  Evaluation of Outcomes: Not Met   Progress in Treatment: Attending groups: Intermittently Participating in groups: Minimally  Taking medication as prescribed: Yes, MD continues to assess for medication changes as needed Toleration medication: Yes, no side effects reported at this time Family/Significant other contact made: No, CSW attempting to make contact with father Patient understands diagnosis: Continuing to assess Discussing patient identified problems/goals with staff: Yes Medical problems stabilized or resolved: Yes Denies suicidal/homicidal ideation: Yes Issues/concerns per patient self-inventory: None Other: N/A  New problem(s) identified: None identified at this time.   New Short Term/Long Term Goal(s): None identified at this time.   Discharge Plan or Barriers: Pt will return home and follow-up with outpatient services.   Reason for Continuation of Hospitalization: Anxiety Hallucinations Mania Medication stabilization  Estimated Length of Stay: 3-5 days; Est DC date 7/7  Attendees: Patient: 07/30/2016  9:54 AM  Physician: Dr. Shea Evans 07/30/2016  9:54 AM  Nursing: Ilda Basset; RN 07/30/2016  9:54 AM  RN Care Manager: Lars Pinks, RN 07/30/2016  9:54 AM  Social Worker: [name], LCSW 07/30/2016  9:54 AM  Recreational Therapist: Marjette  07/30/2016  9:54 AM  Other:  07/30/2016  9:54  AM  Other:  07/30/2016  9:54 AM  Other: 07/30/2016  9:54 AM    Scribe for Treatment Team: Gladstone Lighter, LCSW 07/30/2016 9:54 AM

## 2016-07-30 NOTE — Progress Notes (Signed)
Recreation Therapy Notes  Date: 07/30/2016 Time: 10:00am Location: [address] Dayroom  Group Topic: Self-Expression  Goal Area(s) Addresses:  Patient will identify things that define who they are.  Behavioral Response: Engaged  Intervention: Art  Activity: Patients asked to identify things that define who they are by drawing pictures or writing words inside of a blank head outline. Once all patients completed their picture they were each asked to identify the top 5 things that defined them.  Education:  Self-Expression, Dentist.   Education Outcome: Acknowledges education  Clinical Observations/Feedback: Pt was actively engaged in activity. Pt identified being respectful, sweet, happy, patient and included as the top 5 things that defined who she is.  Donovan Kail, Recreation Therapy Intern  Victorino Sparrow, LRT/CTRS

## 2016-07-30 NOTE — Progress Notes (Signed)
Recreation Therapy Notes  Animal-Assisted Activity (AAA) Program Checklist/Progress Notes Patient Eligibility Criteria Checklist & Daily Group note for Rec Tx Intervention  Date: 07.03.2018 Time: 3:00pm Location: [address]    AAA/T Program Assumption of Risk Form signed by Patient/ or Parent Legal Guardian Yes  Patient is free of allergies or sever asthma Yes  Patient reports no fear of animals Yes  Patient reports no history of cruelty to animals Yes  Patient understands his/her participation is voluntary Yes  Patient washes hands before animal contact Yes  Patient washes hands after animal contact Yes  Behavioral Response: Appropriate   Education: Hand Washing, Appropriate Animal Interaction   Education Outcome: Acknowledges education.   Clinical Observations/Feedback: Patient discussed with MD for appropriateness in pet therapy session. Both LRT and MD agree patient is appropriate for participation. Patient offered participation in session and signed necessary consent. Patient requested to read consent form and proceeded to circle words and add parentheses around phrases in consent form. LRT advised patient she would need to sign a consent form with out edits, patient agreed and signed new consent form without hesitation. Patient attended session, interacted with therapy dog in a gentle petting him softly and smiling brightly while doing so. Patient shared stories about pets with group, specifically she had two dogs that had to be put to sleep last year. Patient demonstrated little emotion while telling story. Patient pleasant to interact with.   Laureen Ochs Bailee Thall, LRT/CTRS        Aliahna Statzer L 07/30/2016 3:22 PM

## 2016-07-30 NOTE — [facility] Group Notes (Signed)
[place] LCSW Group Therapy 07/30/2016 11:00 AM  Type of Therapy: Group Therapy- Feelings about Diagnosis  Participation Level: Active   Participation Quality:  Appropriate  Affect:  Blunted  Cognitive: Alert and Oriented   Insight:  Minimal  Engagement in Therapy: Developing/Improving and Engaged   Modes of Intervention: Clarification, Confrontation, Discussion, Education, Exploration, Limit-setting, Orientation, Problem-solving, Rapport Building, Art therapist, Socialization and Support  Description of Group:   This group will allow patients to explore their thoughts and feelings about diagnoses they have received. Patients will be guided to explore their level of understanding and acceptance of these diagnoses. Facilitator will encourage patients to process their thoughts and feelings about the reactions of others to their diagnosis, and will guide patients in identifying ways to discuss their diagnosis with significant others in their lives. This group will be process-oriented, with patients participating in exploration of their own experiences as well as giving and receiving support and challenge from other group members.  Summary of Progress/Problems:  Pt reports that she has researched her diagnosis of Bipolar Disorder and does not feel that she has those symptoms. However, when asked to identify symptoms of Bipolar, they were vague and somewhat unrelated. Pt reports that she feels that her husband tries to make decisions in her best interest but reports that those decisions are not in her best interest.   Therapeutic Modalities:   Cognitive Behavioral Therapy Solution Focused Therapy Motivational Interviewing Relapse Prevention Therapy  [name], LCSW 07/30/2016 2:49 PM

## 2016-07-31 DIAGNOSIS — F3164 Bipolar disorder, current episode mixed, severe, with psychotic features: Secondary | ICD-10-CM | POA: Diagnosis present

## 2016-07-31 MED ORDER — OLANZAPINE 10 MG PO TABS
10.0000 mg | ORAL_TABLET | Freq: Every day | ORAL | Status: DC
  Filled 2016-07-31: qty 1

## 2016-07-31 MED ORDER — OLANZAPINE 5 MG PO TABS: 5.0000 mg | ORAL_TABLET | Freq: Every day | ORAL | Status: DC

## 2016-07-31 MED ORDER — OLANZAPINE 5 MG PO TABS
5.0000 mg | ORAL_TABLET | Freq: Every day | ORAL | Status: AC
  Administered 2016-07-31: 5 mg via ORAL
  Filled 2016-07-31: qty 1

## 2016-07-31 NOTE — Progress Notes (Signed)
Adult Psychoeducational Group Note  Date:  07/31/2016 Time:  8:49 PM  Group Topic/Focus:  Wrap-Up Group:   The focus of this group is to help patients review their daily goal of treatment and discuss progress on daily workbooks.  Participation Level:  Active  Participation Quality:  Appropriate  Affect:  Appropriate  Cognitive:  Appropriate  Insight: Appropriate  Engagement in Group:  Engaged  Modes of Intervention:  Discussion  Additional Comments: The patient expressed that she reach her goal to keep God first.The patient also said that she rates today a 10.  Nash Shearer 07/31/2016, 8:49 PM

## 2016-07-31 NOTE — Plan of Care (Signed)
Problem: Coping: Goal: Ability to verbalize frustrations and anger appropriately will improve Outcome: Not Progressing Discussed coping skills with patient.

## 2016-07-31 NOTE — Progress Notes (Addendum)
D:  Patient's self inventory sheet, patient sleeps good, no sleep medication.  Good appetite, normal energy level, excellent concentration.  Denied depression, hopeless and anxiety.  Denied withdrawals.  Denied SI.  Denied physical problems.  Denied physical pain.  Goal is to bring glory to Jesus.  Plans to stick to the task at hand.  No discharge plans.   A:  Medications administered per MD orders. Patient had refused medication earlier this morning.  Emotional support and encouragement given patient. R:  Patient denied SI and HI, contracts for safety.  Denied A/V hallucinations.  Safety maintained with 15 minute checks. Patient was sitting in dayroom interacting with internal stimuli, making finger and leg movements.  Visual hallucinations, eye movements.  Patient has been walking the hallway this morning carrying her Bible.

## 2016-07-31 NOTE — Progress Notes (Signed)
D: Patient seen watching TV and interacting with peers on dayroom. Stated her day was "great". Verbalizes no concern. Denies pain, SI/HI, AH/VH at this time. No behavior issues noted.  A: Staff encouraged patient to continue with the treatment plans and verbalize needs to staff. Routine safety checks maintained. Will continue to monitor patient.  R: Patient is safe on unit.

## 2016-07-31 NOTE — Progress Notes (Signed)
Recreation Therapy Notes  Date: 07/31/16 Time: 1000 Location: [address] Dayroom  Group Topic: Leisure Education  Goal Area(s) Addresses:  Patient will identify positive leisure activities.  Patient will identify one positive benefit of participation in leisure activities.   Behavioral Response: Engaged  Intervention: Can with strips of paper with various words, dry erase marker, dry erase board, eraser  Activity: [place].  Patients were select a slip of paper from the can.  The patient was to draw what was on the paper.  The remaining patients were to guess what the drawing was.  The patient drawing the picture could not talk or give clues.  The person who guessed the picture correctly would get the next opportunity.  Education: Leisure Education, Dentist  Education Outcome: Acknowledges education/In group clarification offered/Needs additional education  Clinical Observations/Feedback: Pt was quiet but would take her chances at guessing the pictures.  Pt took a few turns at the board drawing pictures as well.  Pt was pleasant and engaged during activity.   Victorino Sparrow, LRT/CTRS        Victorino Sparrow A 07/31/2016 11:37 AM

## 2016-07-31 NOTE — [facility] Group Notes (Signed)
[facility] LCSW Group Therapy 07/31/2016 1:15 PM  Type of Therapy: Group Therapy- Emotion Regulation  Pt did not attend, declined invitation.   [name], LCSW 07/31/2016 11:04 AM

## 2016-07-31 NOTE — Progress Notes (Signed)
[facility] MD Progress Note  07/31/2016 1:58 PM Vanessa Mills  MRN:  [id number] Subjective: Patient states " I am still hearing voices. I do not think the risperidone is helping."   Objective;Patient seen and chart reviewed.Discussed patient with treatment team.  Pt today seen as anxious , reports she feels better since admission. She is thinking more clear. However , she continues to have AH - which is distressing. Per RN - pt often noted as wringing her hands or being restless or mumbling to self. Continues to need support.     Principal Problem: Bipolar affective disorder, mixed, severe, with psychotic behavior ([place]) Diagnosis:   Patient Active Problem List   Diagnosis Date Noted  . Bipolar affective disorder, mixed, severe, with psychotic behavior ([place]) [F31.64] 07/30/2016  . Hot flash due to medication [R23.2, T50.905A] 09/20/2014  . History of long-term use of multiple prescription drugs [Z92.29] 05/20/2014  . Malignant neoplasm of overlapping sites of left breast in female, estrogen receptor positive ([place]) [Z32.992, Z17.0] 03/18/2014  . DOE (dyspnea on exertion) [R06.09] 09/14/2013  . Abnormal EKG [R94.31] 09/14/2013  . Essential tremor [G25.0] 06/05/2012  . Abnormal involuntary movements(781.0) [R25.9] 06/05/2011  . 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 Bipolar disorder ([place]) [F31.9] 01/30/2011  . Chemotherapy follow-up examination [Z09] 12/28/2010  . Fatigue [R53.83] 12/28/2010   Total Time spent with patient: 25 minutes  Past Psychiatric History: Please see H&P.   Past Medical History:  Past Medical History:  Diagnosis Date  . Bipolar 1 disorder ([place])   . Breast cancer ([place]) 03/07/2011  . Breast cancer, ILC, Left, receptor+, Her2- 10/10/2010  . Chemotherapy follow-up examination 12/28/2010  . Chronic mental illness   . Crohn's disease ([place])   . Fatigue 12/28/2010  . GERD (gastroesophageal reflux disease)    does not take medications for   . Headache(784.0)    takes midodrine for migraines prn  . Meniere's syndrome   . Mental disorder    bipolar, takes saphris at hs  . Movement disorder   . PONV (postoperative nausea and vomiting)   . Raynaud's disease   . S/P radiation therapy 05/15/11 - 07/01/11   Left Breast: 4500 cGy/25 fractions with Boost to Left chest Wall/Mastectomy Scar for Toal dose of 5940 cGy  . Status post chemotherapy    4 cycles AC  . Status post chemotherapy 01/31/11 - 04/18/11   Taxol x 12 weeks    Past Surgical History:  Procedure Laterality Date  . 2 orbital fracture surgeries    . ABDOMINAL HYSTERECTOMY     uterus removed only  . BLADDER SUSPENSION     done 2005  . BREAST SURGERY     Axillary dissection  . MASTECTOMY MODIFIED RADICAL  10/23/2010   Left Dr Margot Chimes  . PORT-A-CATH REMOVAL  08/21/2011   Procedure: REMOVAL PORT-A-CATH;  Surgeon: Haywood Lasso, MD;  Location: [facility];  Service: General;  Laterality: Right;  . PORTACATH PLACEMENT  11/28/2010   via right subclavian - Dr Margot Chimes  . SINUS EXPLORATION     Family History:  Family History  Problem Relation Age of Onset  . Hypertension Mother   . Diabetes Mother   . Heart failure Father   . Cancer Maternal Aunt        breast, thyroid, colonm  . Heart attack Sister    Family Psychiatric  History: Please see H&P.  Social History:  History  Alcohol Use  . 1.7 oz/week  . 2 Glasses of wine, 1  Standard drinks or equivalent per week     History  Drug Use No    Comment: in college    Social History   Social History  . Marital status: Married    Spouse name: Linna Hoff   . Number of children: 1  . Years of education: Masters   Occupational History  . unemployed    Social History Main Topics  . Smoking status: Never Smoker  . Smokeless tobacco: Never Used  . Alcohol use 1.7 oz/week    2 Glasses of wine, 1 Standard drinks or equivalent per week  . Drug use: No     Comment: in college  . Sexual activity: Yes     Comment: erpr+/HER-2  neg.   Other Topics Concern  . None   Social History Narrative   Married to [place]   One dtr- 58 years old- [name]- in [employer]   Patient has a Masters   Patient  has 1 child.    Additional Social History:                         Sleep: Fair  Appetite:  Fair  Current Medications: Current Facility-Administered Medications  Medication Dose Route Frequency Provider Last Rate Last Dose  . acetaminophen (TYLENOL) tablet 650 mg  650 mg Oral Q4H PRN Kerrie Buffalo, NP      . alum & mag hydroxide-simeth (MAALOX/MYLANTA) 200-200-20 MG/5ML suspension 30 mL  30 mL Oral Q6H PRN Kerrie Buffalo, NP      . benztropine (COGENTIN) tablet 0.5 mg  0.5 mg Oral QPC lunch Gabreal Worton, [name] Clock, MD   0.5 mg at 07/31/16 1302  . benztropine (COGENTIN) tablet 0.5 mg  0.5 mg Oral QHS Berwyn Bigley, MD   0.5 mg at 07/30/16 2101  . carbamazepine (TEGRETOL) chewable tablet 200 mg  200 mg Oral BID Hampton Abbot, MD   200 mg at 07/31/16 6378  . cholecalciferol (VITAMIN D) tablet 1,000 Units  1,000 Units Oral Daily Kerrie Buffalo, NP   1,000 Units at 07/31/16 [phone]  . feeding supplement (ENSURE ENLIVE) (ENSURE ENLIVE) liquid 237 mL  237 mL Oral BID BM Derrill Center, NP   237 mL at 07/31/16 0934  . hydrOXYzine (ATARAX/VISTARIL) tablet 25 mg  25 mg Oral BID Kerrie Buffalo, NP   25 mg at 07/31/16 0277  . ibuprofen (ADVIL,MOTRIN) tablet 600 mg  600 mg Oral Q8H PRN Kerrie Buffalo, NP      . magnesium hydroxide (MILK OF MAGNESIA) suspension 30 mL  30 mL Oral Daily PRN Kerrie Buffalo, NP      . Derrill Memo ON 08/01/2016] OLANZapine (ZYPREXA) tablet 10 mg  10 mg Oral QHS Pakou Rainbow, MD      . OLANZapine (ZYPREXA) tablet 5 mg  5 mg Oral QHS Jacqulin Brandenburger, MD      . OLANZapine zydis (ZYPREXA) disintegrating tablet 5 mg  5 mg Oral Q8H PRN Derrill Center, NP      . ondansetron (ZOFRAN) tablet 4 mg  4 mg Oral Q8H PRN Kerrie Buffalo, NP      . traZODone (DESYREL) tablet 50 mg  50 mg Oral QHS Kerrie Buffalo,  NP   50 mg at 07/30/16 2101    Lab Results:  No results found for this or any previous visit (from the past 48 hour(s)).  Blood Alcohol level:  Lab Results  Component Value Date   ETH <5 07/26/2016   ETH (H) 05/19/2010    69  LOWEST DETECTABLE LIMIT FOR SERUM ALCOHOL IS 5 mg/dL FOR MEDICAL PURPOSES ONLY    Metabolic Disorder Labs: Lab Results  Component Value Date   HGBA1C 5.2 07/29/2016   MPG 103 07/29/2016   MPG 103 07/28/2016   Lab Results  Component Value Date   PROLACTIN 68.0 (H) 07/29/2016   PROLACTIN 44.6 (H) 07/28/2016   Lab Results  Component Value Date   CHOL 230 (H) 07/29/2016   TRIG 92 07/29/2016   HDL 100 07/29/2016   CHOLHDL 2.3 07/29/2016   VLDL 18 07/29/2016   LDLCALC 112 (H) 07/29/2016   LDLCALC 111 (H) 03/04/2013    Physical Findings: AIMS: Facial and Oral Movements Muscles of Facial Expression: None, normal Lips and Perioral Area: None, normal Jaw: None, normal Tongue: None, normal,Extremity Movements Upper (arms, wrists, hands, fingers): None, normal Lower (legs, knees, ankles, toes): None, normal, Trunk Movements Neck, shoulders, hips: None, normal, Overall Severity Severity of abnormal movements (highest score from questions above): None, normal Incapacitation due to abnormal movements: None, normal Patient's awareness of abnormal movements (rate only patient's report): No Awareness, Dental Status Current problems with teeth and/or dentures?: No Does patient usually wear dentures?: No  CIWA:  CIWA-Ar Total: 1 COWS:  COWS Total Score: 1  Musculoskeletal: Strength & Muscle Tone: within normal limits Gait & Station: normal Patient leans: N/A  Psychiatric Specialty Exam: Physical Exam  Nursing note and vitals reviewed.   Review of Systems  Psychiatric/Behavioral: Positive for depression and hallucinations. The patient is nervous/anxious.   All other systems reviewed and are negative.   Blood pressure 135/71, pulse (!) 53,  temperature 98.1 F (36.7 C), resp. rate 18, height 5' 7"  (1.702 m), weight 54.9 kg (121 lb), SpO2 98 %.Body mass index is 18.95 kg/m.  General Appearance: Guarded  Eye Contact:  Fair  Speech:  Normal Rate  Volume:  Normal  Mood:  Anxious  Affect:  Congruent  Thought Process:  Goal Directed and Descriptions of Associations: Circumstantial  Orientation:  Full (Time, Place, and Person)  Thought Content:  Delusions and Hallucinations: Auditory, reports her current medication as not helping her voices  Suicidal Thoughts:  No  Homicidal Thoughts:  No  Memory:  Immediate;   Fair Recent;   Fair Remote;   Fair  Judgement:  Impaired  Insight:  Shallow  Psychomotor Activity:  Normal  Concentration:  Concentration: Fair and Attention Span: Fair  Recall:  [employer] of Knowledge:  Fair  Language:  Fair  Akathisia:  No  Handed:  Right  AIMS (if indicated):     Assets:  Communication Skills Desire for Improvement  ADL's:  Intact  Cognition:  WNL  Sleep:  Number of Hours: 6.75   Bipolar affective disorder, mixed, severe, with psychotic behavior ([place]) unstable  Will continue today 07/31/16  plan as below except where it is noted.      Treatment Plan Summary:Patient with bipolar do , continues to be psychotic , anxious , hence will readjust medications , she wants to try zyprexa. Will continue treatment.  Daily contact with patient to assess and evaluate symptoms and progress in treatment and Medication management Reviewed past medical records,treatment plan.  Tegretol 200 mg po bid for mood sx. Tegretol level on 08/01/2016. Will cross taper Risperidone with zyprexa. Start Zyprexa at 5 mg po qhs tonight , increase to Zyprexa 10 mg po qhs for psychosis. Cogentin 0.5 mg po bid for eps. Will continue to monitor vitals ,medication compliance and treatment side effects while patient  is here.  Will monitor for medical issues as well as call consult as needed.  Reviewed labs ,PL elevated -  will monitor . CSW will continue working on disposition.  Patient to participate in therapeutic milieu .      Curley Hogen, MD 07/31/2016, 1:58 PM

## 2016-08-01 LAB — CARBAMAZEPINE LEVEL, TOTAL: CARBAMAZEPINE LVL: 3.4 ug/mL — AB (ref 4.0–12.0)

## 2016-08-01 MED ORDER — CARBAMAZEPINE 100 MG PO CHEW
400.0000 mg | CHEWABLE_TABLET | Freq: Two times a day (BID) | ORAL | Status: DC
  Administered 2016-08-01 – 2016-08-06 (×10): 400 mg via ORAL
  Filled 2016-08-01 (×14): qty 4

## 2016-08-01 MED ORDER — LISINOPRIL 2.5 MG PO TABS
2.5000 mg | ORAL_TABLET | Freq: Every day | ORAL | Status: DC
  Administered 2016-08-01 – 2016-08-06 (×6): 2.5 mg via ORAL
  Filled 2016-08-01 (×9): qty 1

## 2016-08-01 MED ORDER — OLANZAPINE 7.5 MG PO TABS
15.0000 mg | ORAL_TABLET | Freq: Every day | ORAL | Status: DC
  Administered 2016-08-01 – 2016-08-05 (×5): 15 mg via ORAL
  Filled 2016-08-01 (×7): qty 2

## 2016-08-01 NOTE — Progress Notes (Signed)
Pt was witnessed eating a "alive"carpenter bee while outside. Pt picked up off the ground and ate it.  Pt was asked to spit it out and she complied.  Pt nurse was notified of the incident.

## 2016-08-01 NOTE — Progress Notes (Signed)
DAR NOTE: Patient presents with anxious affect and depressed mood. Pt observed in the dayroom interacting with peers. Pt had some monuments of crying spells. Denies pain, auditory and visual hallucinations.  Rates depression at 0, hopelessness at 0, and anxiety at 0.  Maintained on routine safety checks.  Medications given as prescribed.  Support and encouragement offered as needed.  Attended group and participated.  States goal for today is to " bring honor and glory to [employer]."  Patient observed socializing with peers in the dayroom.  Offered no complaint.

## 2016-08-01 NOTE — Progress Notes (Signed)
Recreation Therapy Notes  Date: 08/01/2016 Time: 10:00am Location: 500 [address] Dayroom  Group Topic: Leisure and Lifestyle Education  Goal Area(s) Addresses:  Patient will be able to successfully identify solutions to barriers they have experienced when they have tried to participate in leisure activities. Patient will be able to link the solutions to barriers they face in their everyday lives.  Behavioral Response: Engaged  Intervention: Visual merchandiser, Scissors, Pencils  Activity: Pt will cut red construction paper into bricks and put one barrier they have faced while trying to participate in a leisure activity on 3-4 cut out bricks. Pt will then identify solutions to the barriers they have faced.  Education:  Leisure and Lifestyle Education, Discharge Planning  Education Outcome: Acknowledges education  Clinical Observations/Feedback: Pt participated in group. Pt identified lack of financial resources, satan and moving backwards as barriers that have stopped her from participating in leisure activities. Pt helped peers come up with solutions to barriers they have faced while trying to participate in leisure activities such as not procrastinating. Pt identified that she could use the solutions the group came up with while she made different choices once she is discharged.  Donovan Kail, Recreation Therapy Intern  Victorino Sparrow, LRT/CTRS

## 2016-08-01 NOTE — Progress Notes (Addendum)
D: Pt at the time of assessment was flat isolative and withdrawn to self. Pt at the time denied pain, depression, anxiety, SI, HI or AVH; "I feel just fine." Pt appeared to be anxious, as she would not enter the dayroom several times until it was time for group. Pt attempted to cheek her 2200 medication-needs close monitoring during medication. A: Medications offered as prescribed. All patient's questions and concerns addressed. Support, encouragement, and safe environment provided. 15-minute safety checks continue. R: Pt was med compliant-needs close monitoring during medication. Pt attended wrap-up group. Safety checks continue.

## 2016-08-01 NOTE — Plan of Care (Signed)
Problem: Health Behavior/Discharge Planning: Goal: Compliance with treatment plan for underlying cause of condition will improve Outcome: Progressing Patient taking medications and attending groups per plan of care. Patient verbalizes understanding and is agreeable to current plan of care.  Problem: Safety: Goal: Periods of time without injury will increase Outcome: Progressing Patient is on q15 minute safety checks and low fall risk precautions. Patient contracts for safety on the unit and remains safe at this time.

## 2016-08-01 NOTE — Progress Notes (Signed)
[facility] MD Progress Note  08/01/2016 3:24 PM Vanessa Mills  MRN:  [id number]  Subjective: Vanessa Mills reports, "I'm still hearing just that one voice. We just talk briefly, that is all. I the best sleep last night & I'm taking my medicines".  Objective:  Patient seen and chart reviewed. Discussed patient with treatment team. Pt today seen as calm, quiet & unassuming. She reports that she feels better since admission. She is thinking more clearly. However, she continues to report having auditory hallucination, which she describes as one voice. She says, "We just talk briefly". She is visible on the unit, attending group sessions today. She presents frail looking, but well groomed. She is not disruptive on the unit. She is taking her medications. Denies any adverse effects. She presents with elevated blood pressure readings x 3. Initiated a low dose of Lisinopril 2.5 mg daily. Staff continues to monitor patient closely. And with an improved mood & behavior, her do not admit room status has been discontinued as of today. Patient is now allowed to have a room-mate. Per RN - pt often noted as wringing her hands or being restless or mumbling to self. Staff continues to need support.  Principal Problem: Bipolar disorder, curr episode mixed, severe, with psychotic features ([place]) Diagnosis:   Patient Active Problem List   Diagnosis Date Noted  . Bipolar disorder, curr episode mixed, severe, with psychotic features ([place]) [F31.64] 07/31/2016  . Hot flash due to medication [R23.2, T50.905A] 09/20/2014  . History of long-term use of multiple prescription drugs [Z92.29] 05/20/2014  . Malignant neoplasm of overlapping sites of left breast in female, estrogen receptor positive ([place]) [W09.811, Z17.0] 03/18/2014  . DOE (dyspnea on exertion) [R06.09] 09/14/2013  . Abnormal EKG [R94.31] 09/14/2013  . Essential tremor [G25.0] 06/05/2012  . Abnormal involuntary movements(781.0) [R25.9] 06/05/2011  . Spasmodic torticollis  [G24.3] 06/05/2011  . Bipolar disorder ([place]) [F31.9] 01/30/2011  . Chemotherapy follow-up examination [Z09] 12/28/2010  . Fatigue [R53.83] 12/28/2010   Total Time spent with patient: 15 minutes  Past Psychiatric History: Please see H&P.   Past Medical History:  Past Medical History:  Diagnosis Date  . Bipolar 1 disorder ([place])   . Breast cancer ([place]) 03/07/2011  . Breast cancer, ILC, Left, receptor+, Her2- 10/10/2010  . Chemotherapy follow-up examination 12/28/2010  . Chronic mental illness   . Crohn's disease ([place])   . Fatigue 12/28/2010  . GERD (gastroesophageal reflux disease)    does not take medications for   . Headache(784.0)    takes midodrine for migraines prn  . Meniere's syndrome   . Mental disorder    bipolar, takes saphris at hs  . Movement disorder   . PONV (postoperative nausea and vomiting)   . Raynaud's disease   . S/P radiation therapy 05/15/11 - 07/01/11   Left Breast: 4500 cGy/25 fractions with Boost to Left chest Wall/Mastectomy Scar for Toal dose of 5940 cGy  . Status post chemotherapy    4 cycles AC  . Status post chemotherapy 01/31/11 - 04/18/11   Taxol x 12 weeks    Past Surgical History:  Procedure Laterality Date  . 2 orbital fracture surgeries    . ABDOMINAL HYSTERECTOMY     uterus removed only  . BLADDER SUSPENSION     done 2005  . BREAST SURGERY     Axillary dissection  . MASTECTOMY MODIFIED RADICAL  10/23/2010   Left Dr Margot Chimes  . PORT-A-CATH REMOVAL  08/21/2011   Procedure: REMOVAL PORT-A-CATH;  Surgeon: Haywood Lasso,  MD;  Location: [facility];  Service: General;  Laterality: Right;  . PORTACATH PLACEMENT  11/28/2010   via right subclavian - Dr Margot Chimes  . SINUS EXPLORATION     Family History:  Family History  Problem Relation Age of Onset  . Hypertension Mother   . Diabetes Mother   . Heart failure Father   . Cancer Maternal Aunt        breast, thyroid, colonm  . Heart attack Sister    Family Psychiatric  History:  Please see H&P.  Social History:  History  Alcohol Use  . 1.7 oz/week  . 2 Glasses of wine, 1 Standard drinks or equivalent per week     History  Drug Use No    Comment: in college    Social History   Social History  . Marital status: Married    Spouse name: Linna Hoff   . Number of children: 1  . Years of education: Masters   Occupational History  . unemployed    Social History Main Topics  . Smoking status: Never Smoker  . Smokeless tobacco: Never Used  . Alcohol use 1.7 oz/week    2 Glasses of wine, 1 Standard drinks or equivalent per week  . Drug use: No     Comment: in college  . Sexual activity: Yes     Comment: erpr+/HER-2 neg.   Other Topics Concern  . None   Social History Narrative   Married to [place]   One dtr- 56 years old- [name]- in [place]   Patient has a Masters   Patient  has 1 child.    Additional Social History:   Sleep: "I slept well last night, the best sleep I have ever had".  Appetite:  Fair  Current Medications: Current Facility-Administered Medications  Medication Dose Route Frequency Provider Last Rate Last Dose  . acetaminophen (TYLENOL) tablet 650 mg  650 mg Oral Q4H PRN Kerrie Buffalo, NP      . alum & mag hydroxide-simeth (MAALOX/MYLANTA) 200-200-20 MG/5ML suspension 30 mL  30 mL Oral Q6H PRN Kerrie Buffalo, NP      . benztropine (COGENTIN) tablet 0.5 mg  0.5 mg Oral QHS Eappen, [name] Clock, MD   0.5 mg at 07/31/16 2117  . carbamazepine (TEGRETOL) chewable tablet 400 mg  400 mg Oral BID Izediuno, Laruth Bouchard, MD      . cholecalciferol (VITAMIN D) tablet 1,000 Units  1,000 Units Oral Daily Kerrie Buffalo, NP   1,000 Units at 08/01/16 [phone]  . feeding supplement (ENSURE ENLIVE) (ENSURE ENLIVE) liquid 237 mL  237 mL Oral BID BM Derrill Center, NP   237 mL at 07/31/16 1452  . hydrOXYzine (ATARAX/VISTARIL) tablet 25 mg  25 mg Oral BID Kerrie Buffalo, NP   25 mg at 08/01/16 7858  . ibuprofen (ADVIL,MOTRIN) tablet 600 mg  600 mg Oral Q8H PRN  Kerrie Buffalo, NP      . magnesium hydroxide (MILK OF MAGNESIA) suspension 30 mL  30 mL Oral Daily PRN Kerrie Buffalo, NP      . OLANZapine (ZYPREXA) tablet 15 mg  15 mg Oral QHS Izediuno, Vincent A, MD      . OLANZapine zydis (ZYPREXA) disintegrating tablet 5 mg  5 mg Oral Q8H PRN Derrill Center, NP      . ondansetron (ZOFRAN) tablet 4 mg  4 mg Oral Q8H PRN Kerrie Buffalo, NP      . traZODone (DESYREL) tablet 50 mg  50 mg Oral QHS Agustin,  Freda Munro, NP   50 mg at 07/31/16 2116    Lab Results:  Results for orders placed or performed during the hospital encounter of 07/27/16 (from the past 48 hour(s))  Carbamazepine level, total     Status: Abnormal   Collection Time: 08/01/16  6:12 AM  Result Value Ref Range   Carbamazepine Lvl 3.4 (L) 4.0 - 12.0 ug/mL    Comment: Performed at [facility], [address], [place], [place] [address]   Blood Alcohol level:  Lab Results  Component Value Date   ETH <5 07/26/2016   ETH (H) 05/19/2010    69        LOWEST DETECTABLE LIMIT FOR SERUM ALCOHOL IS 5 mg/dL FOR MEDICAL PURPOSES ONLY   Metabolic Disorder Labs: Lab Results  Component Value Date   HGBA1C 5.2 07/29/2016   MPG 103 07/29/2016   MPG 103 07/28/2016   Lab Results  Component Value Date   PROLACTIN 68.0 (H) 07/29/2016   PROLACTIN 44.6 (H) 07/28/2016   Lab Results  Component Value Date   CHOL 230 (H) 07/29/2016   TRIG 92 07/29/2016   HDL 100 07/29/2016   CHOLHDL 2.3 07/29/2016   VLDL 18 07/29/2016   LDLCALC 112 (H) 07/29/2016   LDLCALC 111 (H) 03/04/2013   Physical Findings: AIMS: Facial and Oral Movements Muscles of Facial Expression: None, normal Lips and Perioral Area: None, normal Jaw: None, normal Tongue: None, normal,Extremity Movements Upper (arms, wrists, hands, fingers): None, normal Lower (legs, knees, ankles, toes): None, normal, Trunk Movements Neck, shoulders, hips: None, normal, Overall Severity Severity of abnormal movements (highest score from  questions above): None, normal Incapacitation due to abnormal movements: None, normal Patient's awareness of abnormal movements (rate only patient's report): No Awareness, Dental Status Current problems with teeth and/or dentures?: No Does patient usually wear dentures?: No  CIWA:  CIWA-Ar Total: 1 COWS:  COWS Total Score: 1  Musculoskeletal: Strength & Muscle Tone: within normal limits Gait & Station: normal Patient leans: N/A  Psychiatric Specialty Exam: Physical Exam  Nursing note and vitals reviewed.   Review of Systems  Psychiatric/Behavioral: Positive for depression and hallucinations. The patient is nervous/anxious.   All other systems reviewed and are negative.   Blood pressure (!) 159/87, pulse 62, temperature 98.5 F (36.9 C), resp. rate 18, height _0  (1.702 m), weight 54.9 kg (121 lb), SpO2 98 %.Body mass index is 18.95 kg/m.  General Appearance: Guarded  Eye Contact:  Fair  Speech:  Normal Rate  Volume:  Normal  Mood:  Anxious  Affect:  Congruent  Thought Process:  Goal Directed and Descriptions of Associations: Circumstantial  Orientation:  Full (Time, Place, and Person)  Thought Content:  Delusions and Hallucinations: Auditory,  Suicidal Thoughts:  No  Homicidal Thoughts:  No  Memory:  Immediate;   Fair Recent;   Fair Remote;   Fair  Judgement:  Impaired  Insight:  Shallow  Psychomotor Activity:  Normal  Concentration:  Concentration: Fair and Attention Span: Fair  Recall:  [employer] of Knowledge:  Fair  Language:  Fair  Akathisia:  No  Handed:  Right  AIMS (if indicated):     Assets:  Communication Skills Desire for Improvement  ADL's:  Intact  Cognition:  WNL  Sleep:  Number of Hours: 6.75   Bipolar disorder, curr episode mixed, severe, with psychotic features ([place]) unstable  Will continue today 08/01/16  plan as below except where it is noted.  Treatment Plan Summary: Patient with  bipolar diorder, continues to report psychotic symptoms.  Seem calm, the attending psychiatrist readjusted medications yesterday, she continues on zyprexa. Will continue treatment.  Daily contact with patient to assess and evaluate symptoms and progress in treatment and Medication management  Reviewed past medical records,treatment plan.   Tegretol 200 mg po bid for mood sx. Tegretol level on 08/01/2016, reviewed results, 3.4, within normal level.  Continue Zyprexa 15 mg po qhs for psychosis.  Continue Cogentin 0.5 mg po bid for eps.  Will continue to monitor vitals ,medication compliance and treatment side effects while patient is here.   Will monitor for medical issues as well as call consult as needed.   Reviewed labs, PL elevated - will monitor . CSW will continue working on disposition.  Patient to participate in therapeutic milieu .  Patient is now allowed to have a roommate starting today.  Encarnacion Slates, NP, PMHNP, FNP-BC. 08/01/2016, 3:24 PMPatient ID: [name], female   DOB: [birth year], 58 y.o.   MRN: [id number]

## 2016-08-01 NOTE — Progress Notes (Signed)
Nursing Progress Note [id number]  D) Patient presents anxious but pleasant this evening. Patient taking medications as prescribed. Patient attended group and was engaged. Patient remains disoriented at times. Patient denies SI/HI/AVH or pain. Patient contracts for safety on the unit. Patient reports sleeping well with current regimen. Patient hypertensive this evening but received medication. Patient asymptomatic. Will reevaluate in the morning.  A) Emotional support given. 1:1 interaction and active listening provided. Patient medicated as prescribed. Medications and plan of care reviewed with patient. Patient verbalized understanding without further questions. Snacks and fluids provided. Opportunities for questions or concerns presented to patient. Patient encouraged to continue to work on treatment goals. Labs, vital signs and patient behavior monitored throughout shift. Patient safety maintained with q15 min safety checks. Low fall risk precautions in place and reviewed with patient; patient verbalized understanding.  R) Patient receptive to interaction with nurse. Patient remains safe on the unit at this time. Patient denies any adverse medication reactions at this time. Patient is resting in bed without complaints. Will continue to monitor.

## 2016-08-01 NOTE — BHH Group Notes (Signed)
[facility] Mental Health Association Group Therapy 08/01/2016 1:15pm  Type of Therapy: Mental Health Association Presentation  Participation Level: Attentive  Participation Quality: Attentive  Affect: Appropriate  Cognitive: Oriented  Insight: Limited  Engagement in Therapy: Engaged  Modes of Intervention: Discussion, Education and Socialization  Summary of Progress/Problems: Mental Health Association ([place]) Speaker came to talk about his personal journey with substance abuse and addiction. The pt processed ways by which to relate to the speaker. [place] speaker provided handouts and educational information pertaining to groups and services offered by the [facility]. Pt was engaged in speaker's presentation and was receptive to resources provided.    [name], LCSW 08/01/2016 12:32 PM

## 2016-08-01 NOTE — Progress Notes (Signed)
[place] Group Notes:  (Nursing/MHT/Case Management/Adjunct)  Date:  08/01/2016  Time:  8:55 PM  Type of Therapy:  Psychoeducational Skills  Participation Level:  Active  Participation Quality:  Appropriate  Affect:  Appropriate  Cognitive:  Appropriate  Insight:  Appropriate  Engagement in Group:  Engaged  Modes of Intervention:  Education  Summary of Progress/Problems: The patient described her day as having been "fantastic". She stated that she accomplished a number of "tasks" and that she felt happier today. Her goal for tomorrow is to "relax".   Archie Balboa S 08/01/2016, 8:55 PM

## 2016-08-02 NOTE — Progress Notes (Signed)
DAR NOTE: Patient presents with anxious affect and depressed mood. Pt has been visible in the milieu interacting with peers. Pt appears disorganized and appear to be responding. Denies pain, auditory and visual hallucinations.  Rates depression at 0, hopelessness at 0, and anxiety at 0.  Maintained on routine safety checks.  Medications given as prescribed.  Support and encouragement offered as needed.  Attended group and participated.  States goal for today is "to be released."  Patient observed socializing with peers in the dayroom.  Offered no complaint.

## 2016-08-02 NOTE — Plan of Care (Signed)
Problem: Activity: Goal: Interest or engagement in activities will improve Outcome: Progressing Has been attending groups, encouraged continued participation in group and unit activities.

## 2016-08-02 NOTE — Progress Notes (Signed)
Recreation Therapy Notes  Date: 08/02/16 Time: 1000 Location: 500 Hall Dayroom  Group Topic: Communication, Team Building, Problem Solving  Goal Area(s) Addresses:  Patient will effectively work with peer towards shared goal.  Patient will identify skill used to make activity successful.  Patient will identify how skills used during activity can be used to reach post d/c goals.   Intervention: STEM Activity   Activity: [employer]. Patients were provided the following materials: 5 drinking straws, 5 rubber bands, 5 paper clips, 2 index cards, 2 drinking cups, and 2 toilet paper rolls. Using the provided materials patients were asked to build a launching mechanisms to launch a ping pong ball approximately 12 feet. Patients were divided into teams of 3-5.   Education: Education officer, community, Dentist.   Education Outcome: Acknowledges education/In group clarification offered/Needs additional education.   Clinical Observations/Feedback: Pt did not attend group.   Victorino Sparrow, LRT/CTRS         Victorino Sparrow A 08/02/2016 12:07 PM

## 2016-08-02 NOTE — Progress Notes (Signed)
Adult Psychoeducational Group Note  Date:  08/02/2016 Time:  9:05 PM  Group Topic/Focus:  Wrap-Up Group:   The focus of this group is to help patients review their daily goal of treatment and discuss progress on daily workbooks.  Participation Level:  Active  Participation Quality:  Appropriate  Affect:  Appropriate  Cognitive:  Appropriate  Insight: Appropriate  Engagement in Group:  Engaged  Modes of Intervention:  Discussion  Additional Comments:  Pt stated her goal for today was to interact more with peers and to talk with her doctor about her discharged plan. Pt stated she achieved her goals for today and rated her day and overall 7 out of 10.  Candy Sledge 08/02/2016, 9:05 PM

## 2016-08-02 NOTE — Progress Notes (Signed)
D:  Vanessa Mills has been up and visible on the unit.  She has been attending groups and interacting some with peers.  She was noted in day room hopping around on one foot and acting bizarre but was able to be redirected without difficulty.  She is pleasant and cooperative.  She denied any pain or discomfort and appeared to be in no physical distress.  She denied SI/HI but does report she hears voices all the time.  "They don't tell me to do anything."  She was able to take her medications without difficulty.  No prn's required at this time.  A:  Q 15 minute checks maintained for safety.  Support and encouragement provided.  R:  Vanessa Mills remains safe on the unit.  We will continue to monitor the progress towards her goals.

## 2016-08-02 NOTE — BHH Group Notes (Signed)
Spiritual care group focused on theme of "care." Participants worked to finish the sentence "care is" and engaged in facilitated dialog around theme. Looked at Arboriculturist photos and picked a photo that represented the theme of care in their life.    Vanessa Mills was present throughout majority of group, left at the end prior to sharing during group activity.   Alert and attentive.  Engaged in conversation when prompted  Appropriate affect.   Described care as her relationship with her daughter (27) and stated that this relationship still feels balanced toward Vanessa Mills taking care or her daughter vs. Feeling care.  Did not describe relationships in her life where she feels as much care as she gives.     [facility] / BHH Chaplain Jerene Pitch, [place]

## 2016-08-02 NOTE — Progress Notes (Signed)
[facility] MD Progress Note  08/02/2016 4:11 PM Vanessa Mills  MRN:  [id number]  Subjective: Vanessa Mills reports, "Im still doing better. I continue to hear that one voice of emphasis. It is talking about an article with a person. I got to go outside today wand get some sunshine, play some basketball and enjoy myself. It felt good. I enjoyed dinner yesterday as well. "  Objective:  56 year old female who presented with her husband with c/o non compliance, becoming manic and hallucinations. Patient seen and chart reviewed. Discussed patient with treatment team. Pt today seen as calm, quiet & unassuming. She reports that she feels better since admission. She is thinking more clearly. However, she continues to report having auditory hallucination, which she describes as one voice. She is visible on the unit, attending group sessions today. She presents frail looking, but well groomed. She is not disruptive on the unit. She is taking her medications. Denies any adverse effects. She presents with elevated blood pressure readings x 3. Initiated a low dose of Lisinopril 2.5 mg daily. Staff continues to monitor patient closely. And with an improved mood & behavior, her do not admit room status has been discontinued as of today. Patient is now allowed to have a room-mate.   Principal Problem: Bipolar disorder, curr episode mixed, severe, with psychotic features ([place]) Diagnosis:   Patient Active Problem List   Diagnosis Date Noted  . Bipolar disorder, curr episode mixed, severe, with psychotic features ([place]) [F31.64] 07/31/2016  . Hot flash due to medication [R23.2, T50.905A] 09/20/2014  . History of long-term use of multiple prescription drugs [Z92.29] 05/20/2014  . Malignant neoplasm of overlapping sites of left breast in female, estrogen receptor positive ([place]) [Q11.941, Z17.0] 03/18/2014  . DOE (dyspnea on exertion) [R06.09] 09/14/2013  . Abnormal EKG [R94.31] 09/14/2013  . Essential tremor [G25.0] 06/05/2012   . Abnormal involuntary movements(781.0) [R25.9] 06/05/2011  . 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 Bipolar disorder ([place]) [F31.9] 01/30/2011  . Chemotherapy follow-up examination [Z09] 12/28/2010  . Fatigue [R53.83] 12/28/2010   Total Time spent with patient: 15 minutes  Past Psychiatric History: Please see H&P.   Past Medical History:  Past Medical History:  Diagnosis Date  . Bipolar 1 disorder ([place])   . Breast cancer ([place]) 03/07/2011  . Breast cancer, ILC, Left, receptor+, Her2- 10/10/2010  . Chemotherapy follow-up examination 12/28/2010  . Chronic mental illness   . Crohn's disease ([place])   . Fatigue 12/28/2010  . GERD (gastroesophageal reflux disease)    does not take medications for   . Headache(784.0)    takes midodrine for migraines prn  . Meniere's syndrome   . Mental disorder    bipolar, takes saphris at hs  . Movement disorder   . PONV (postoperative nausea and vomiting)   . Raynaud's disease   . S/P radiation therapy 05/15/11 - 07/01/11   Left Breast: 4500 cGy/25 fractions with Boost to Left chest Wall/Mastectomy Scar for Toal dose of 5940 cGy  . Status post chemotherapy    4 cycles AC  . Status post chemotherapy 01/31/11 - 04/18/11   Taxol x 12 weeks    Past Surgical History:  Procedure Laterality Date  . 2 orbital fracture surgeries    . ABDOMINAL HYSTERECTOMY     uterus removed only  . BLADDER SUSPENSION     done 2005  . BREAST SURGERY     Axillary dissection  . MASTECTOMY MODIFIED RADICAL  10/23/2010   Left Dr Margot Chimes  . PORT-A-CATH  REMOVAL  08/21/2011   Procedure: REMOVAL PORT-A-CATH;  Surgeon: Haywood Lasso, MD;  Location: [place];  Service: General;  Laterality: Right;  . PORTACATH PLACEMENT  11/28/2010   via right subclavian - Dr Margot Chimes  . SINUS EXPLORATION     Family History:  Family History  Problem Relation Age of Onset  . Hypertension Mother   . Diabetes Mother   . Heart failure Father   . Cancer Maternal Aunt         breast, thyroid, colonm  . Heart attack Sister    Family Psychiatric  History: Please see H&P.  Social History:  History  Alcohol Use  . 1.7 oz/week  . 2 Glasses of wine, 1 Standard drinks or equivalent per week     History  Drug Use No    [place]: in college    Social History   Social History  . Marital status: Married    Spouse name: Vanessa Mills   . Number of children: 1  . Years of education: Masters   Occupational History  . unemployed    Social History Main Topics  . Smoking status: Never Smoker  . Smokeless tobacco: Never Used  . Alcohol use 1.7 oz/week    2 Glasses of wine, 1 Standard drinks or equivalent per week  . Drug use: No     [place]: in college  . Sexual activity: Yes     Mills: erpr+/HER-2 neg.   Other Topics Concern  . None   Social History Narrative   Married to Vanessa Mills   One dtr- 56 years old- Vanessa Mills- in [place]   Patient has a Masters   Patient  has 1 child.    Additional Social History:   Sleep: "I slept well last night, the best sleep I have ever had".  Appetite:  Fair  Current Medications: Current Facility-Administered Medications  Medication Dose Route Frequency Provider Last Rate Last Dose  . acetaminophen (TYLENOL) tablet 650 mg  650 mg Oral Q4H PRN Kerrie Buffalo, NP      . alum & mag hydroxide-simeth (MAALOX/MYLANTA) 200-200-20 MG/5ML suspension 30 mL  30 mL Oral Q6H PRN Kerrie Buffalo, NP      . benztropine (COGENTIN) tablet 0.5 mg  0.5 mg Oral QHS Eappen, Saramma, MD   0.5 mg at 08/01/16 2127  . carbamazepine (TEGRETOL) chewable tablet 400 mg  400 mg Oral BID Artist Beach, MD   400 mg at 08/02/16 0841  . cholecalciferol (VITAMIN D) tablet 1,000 Units  1,000 Units Oral Daily Kerrie Buffalo, NP   1,000 Units at 08/02/16 0841  . feeding supplement (ENSURE ENLIVE) (ENSURE ENLIVE) liquid 237 mL  237 mL Oral BID BM Derrill Center, NP   237 mL at 07/31/16 1452  . hydrOXYzine (ATARAX/VISTARIL) tablet 25 mg  25 mg Oral BID  Kerrie Buffalo, NP   25 mg at 08/02/16 0841  . ibuprofen (ADVIL,MOTRIN) tablet 600 mg  600 mg Oral Q8H PRN Kerrie Buffalo, NP      . lisinopril (PRINIVIL,ZESTRIL) tablet 2.5 mg  2.5 mg Oral Daily Lindell Spar I, NP   2.5 mg at 08/02/16 0841  . magnesium hydroxide (MILK OF MAGNESIA) suspension 30 mL  30 mL Oral Daily PRN Kerrie Buffalo, NP      . OLANZapine (ZYPREXA) tablet 15 mg  15 mg Oral QHS Izediuno, Laruth Bouchard, MD   15 mg at 08/01/16 2127  . OLANZapine zydis (ZYPREXA) disintegrating tablet 5 mg  5 mg Oral Q8H  PRN Derrill Center, NP      . ondansetron [facility]) tablet 4 mg  4 mg Oral Q8H PRN Kerrie Buffalo, NP      . traZODone (DESYREL) tablet 50 mg  50 mg Oral QHS Kerrie Buffalo, NP   50 mg at 08/01/16 2130    Lab Results:  Results for orders placed or performed during the hospital encounter of 07/27/16 (from the past 48 hour(s))  Carbamazepine level, total     Status: Abnormal   Collection Time: 08/01/16  6:12 AM  Result Value Ref Range   Carbamazepine Lvl 3.4 (L) 4.0 - 12.0 ug/mL    [place]: Performed at [facility], [address], [place], [place] [address]   Blood Alcohol level:  Lab Results  Component Value Date   ETH <5 07/26/2016   ETH (H) 05/19/2010    69        LOWEST DETECTABLE LIMIT FOR SERUM ALCOHOL IS 5 mg/dL FOR MEDICAL PURPOSES ONLY   Metabolic Disorder Labs: Lab Results  Component Value Date   HGBA1C 5.2 07/29/2016   MPG 103 07/29/2016   MPG 103 07/28/2016   Lab Results  Component Value Date   PROLACTIN 68.0 (H) 07/29/2016   PROLACTIN 44.6 (H) 07/28/2016   Lab Results  Component Value Date   CHOL 230 (H) 07/29/2016   TRIG 92 07/29/2016   HDL 100 07/29/2016   CHOLHDL 2.3 07/29/2016   VLDL 18 07/29/2016   LDLCALC 112 (H) 07/29/2016   LDLCALC 111 (H) 03/04/2013   Physical Findings: AIMS: Facial and Oral Movements Muscles of Facial Expression: None, normal Lips and Perioral Area: None, normal Jaw: None, normal Tongue: None,  normal,Extremity Movements Upper (arms, wrists, hands, fingers): None, normal Lower (legs, knees, ankles, toes): None, normal, Trunk Movements Neck, shoulders, hips: None, normal, Overall Severity Severity of abnormal movements (highest score from questions above): None, normal Incapacitation due to abnormal movements: None, normal Patient's awareness of abnormal movements (rate only patient's report): No Awareness, Dental Status Current problems with teeth and/or dentures?: No Does patient usually wear dentures?: No  CIWA:  CIWA-Ar Total: 1 COWS:  COWS Total Score: 1  Musculoskeletal: Strength & Muscle Tone: within normal limits Gait & Station: normal Patient leans: N/A  Psychiatric Specialty Exam: Physical Exam  Nursing note and vitals reviewed.   Review of Systems  Psychiatric/Behavioral: Positive for depression and hallucinations. The patient is nervous/anxious.   All other systems reviewed and are negative.   Blood pressure 104/62, pulse 70, temperature 98.6 F (37 C), temperature source Oral, resp. rate 20, height 5' 7"  (1.702 m), weight 54.9 kg (121 lb), SpO2 98 %.Body mass index is 18.95 kg/m.  General Appearance: Guarded  Eye Contact:  Fair  Speech:  Normal Rate  Volume:  Normal  Mood:  Anxious  Affect:  Congruent  Thought Process:  Goal Directed and Descriptions of Associations: Circumstantial  Orientation:  Full (Time, Place, and Person)  Thought Content:  Delusions and Hallucinations: Auditory,  Suicidal Thoughts:  No  Homicidal Thoughts:  No  Memory:  Immediate;   Fair Recent;   Fair Remote;   Fair  Judgement:  Impaired  Insight:  Shallow  Psychomotor Activity:  Normal  Concentration:  Concentration: Fair and Attention Span: Fair  Recall:  [employer] of Knowledge:  Fair  Language:  Fair  Akathisia:  No  Handed:  Right  AIMS (if indicated):     Assets:  Communication Skills Desire for Improvement  ADL's:  Intact  Cognition:  WNL  Sleep:  Number of  Hours: 6.75   Bipolar disorder, curr episode mixed, severe, with psychotic features ([place]) unstable  Will continue today 08/02/16  plan as below except where it is noted.  Treatment Plan Summary: Patient with bipolar diorder, continues to report psychotic symptoms. Seem calm, the attending psychiatrist readjusted medications yesterday, she continues on zyprexa. Will continue treatment.  Daily contact with patient to assess and evaluate symptoms and progress in treatment and Medication management  Reviewed past medical records,treatment plan.   Tegretol 200 mg po bid for mood sx. Tegretol level on 08/01/2016, reviewed results, 3.4, within normal level.  Continue Zyprexa 15 mg po qhs for psychosis.  Continue Cogentin 0.5 mg po bid for eps.  Will continue to monitor vitals ,medication compliance and treatment side effects while patient is here.   Will monitor for medical issues as well as call consult as needed.   Reviewed labs, PL elevated - will monitor . CSW will continue working on disposition.  Patient to participate in therapeutic milieu .  Patient is now allowed to have a roommate starting today.  Nanci Pina, FNP, PMHNP, FNP-BC. 08/02/2016, 4:11 PMPatient ID: [name], female   DOB: [birth year], 58 y.o.   MRN: [id number]

## 2016-08-02 NOTE — Progress Notes (Signed)
Per MHT, patient observed spitting several times in her room this morning towards the direction of her roommate. MHT directed patient to stop spitting. Patient irritable with staff this morning and denies spitting.

## 2016-08-03 NOTE — Plan of Care (Signed)
Problem: Nutritional: Goal: Ability to achieve adequate nutritional intake will improve Outcome: Progressing Vanessa Mills has reported that she is eating well.  She was noted eating ice cream for snack this evening and said that she ate well at dinner.   We will continue to monitor the progress towards her goals.

## 2016-08-03 NOTE — Progress Notes (Signed)
Adult Psychoeducational Group Note  Date:  08/03/2016 Time:  8:49 PM  Group Topic/Focus:  Wrap-Up Group:   The focus of this group is to help patients review their daily goal of treatment and discuss progress on daily workbooks.  Participation Level:  Active  Participation Quality:  Appropriate  Affect:  Appropriate  Cognitive:  Oriented  Insight: Limited  Engagement in Group:  Engaged  Modes of Intervention:  Socialization and Support  Additional Comments:  Patient attended and participated in group tonight. She reports having a good day. She took a nap, took a shower, went rom her meals and attended groups. The day was a normal day for her.  Salley Scarlet [facility] 08/03/2016, 8:49 PM

## 2016-08-03 NOTE — BHH Group Notes (Signed)
[place] Group Notes: (Clinical Social Work)   08/03/2016      Type of Therapy:  Group Therapy   Participation Level:  Did Not Attend despite MHT prompting   Selmer Dominion, LCSW 08/03/2016, 12:18 PM

## 2016-08-03 NOTE — Plan of Care (Signed)
Problem: Activity: Goal: Interest or engagement in activities will improve Outcome: Not Progressing Internally preoccupied, not attending group or recreational activities Goal: Sleeping patterns will improve Outcome: Progressing Slept over 6 hours last night  Problem: Education: Goal: Emotional status will improve Outcome: Progressing Subdued affect, denies all anxiety and depression Goal: Mental status will improve Outcome: Progressing Reporting no AH today "but am hearing self talk". Alert and oriented.  Problem: Coping: Goal: Ability to verbalize frustrations and anger appropriately will improve Outcome: Progressing Calm and cooperative with no angry outbursts or irritability Goal: Ability to demonstrate self-control will improve Outcome: Progressing No bizarre behavior or dyscontrol observed  Problem: Physical Regulation: Goal: Ability to maintain clinical measurements within normal limits will improve Outcome: Not Progressing Blood pressure remains elevated

## 2016-08-03 NOTE — Progress Notes (Signed)
[facility] MD Progress Note  08/03/2016 3:03 PM [name]  MRN:  [id number]  Subjective: Vanessa Mills reports, "Im doing better. I think the voices I hear is self talk and I know they need to stop before you will let me go home. I know they are me hearing myself.  "  Per nursing: Vanessa Mills has been up and visible on the unit.  Vanessa Mills has been attending groups and interacting some with peers.  Vanessa Mills Mills noted in day room hopping around on one foot and acting bizarre but Mills able to be redirected without difficulty.  Vanessa Mills is pleasant and cooperative.  Vanessa Mills denied any pain or discomfort and appeared to be in no physical distress.  Vanessa Mills denied SI/HI but does report Vanessa Mills hears voices all the time.  "They don't tell me to do anything."  Vanessa Mills Mills able to take her medications without difficulty.  No prn's required at this time.  A:  Q 15 minute checks maintained for safety.  Support and encouragement provided.  R:  Vanessa Mills remains safe on the unit.  We will continue to monitor the progress towards her goals.   Objective:  56 year old female who presented with her husband with c/o non compliance, becoming manic and hallucinations. Patient seen and chart reviewed. Discussed patient with treatment team. Pt is observed engaging well with her peers in the dayroom and staff. Vanessa Mills states excuse me prior to getting up and exit the room which is appropraite when exiting a conversation. Vanessa Mills is able to identify the voices as self talk, and that are very strong and that Vanessa Mills should not be talking to them in order to go home.  Pt today seen as calm, quiet & unassuming. Vanessa Mills reports that Vanessa Mills feels better since admission. Vanessa Mills is thinking more clearly. However, Vanessa Mills continues to report having auditory hallucination, which Vanessa Mills describes as one voice. Vanessa Mills is visible on the unit, attending group sessions today. Vanessa Mills presents frail looking, but well groomed. Vanessa Mills is not disruptive on the unit. Vanessa Mills is taking her medications. Denies any adverse effects.  Vanessa Mills presents with elevated blood pressure readings x 3. Initiated a low dose of Lisinopril 2.5 mg daily. Staff continues to monitor patient closely. And with an improved mood & behavior, her do not admit room status has been discontinued as of today. Patient is now allowed to have a room-mate.   Principal Problem: Bipolar disorder, curr episode mixed, severe, with psychotic features ([place]) Diagnosis:   Patient Active Problem List   Diagnosis Date Noted  . Bipolar disorder, curr episode mixed, severe, with psychotic features ([place]) [F31.64] 07/31/2016  . Hot flash due to medication [R23.2, T50.905A] 09/20/2014  . History of long-term use of multiple prescription drugs [Z92.29] 05/20/2014  . Malignant neoplasm of overlapping sites of left breast in female, estrogen receptor positive ([place]) [X65.537, Z17.0] 03/18/2014  . DOE (dyspnea on exertion) [R06.09] 09/14/2013  . Abnormal EKG [R94.31] 09/14/2013  . Essential tremor [G25.0] 06/05/2012  . Abnormal involuntary movements(781.0) [R25.9] 06/05/2011  . 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 Bipolar disorder ([place]) [F31.9] 01/30/2011  . Chemotherapy follow-up examination [Z09] 12/28/2010  . Fatigue [R53.83] 12/28/2010   Total Time spent with patient: 15 minutes  Past Psychiatric History: Please see H&P.   Past Medical History:  Past Medical History:  Diagnosis Date  . Bipolar 1 disorder ([place])   . Breast cancer ([place]) 03/07/2011  . Breast cancer, ILC, Left, receptor+, Her2- 10/10/2010  . Chemotherapy follow-up examination 12/28/2010  . Chronic mental  illness   . Crohn's disease ([place])   . Fatigue 12/28/2010  . GERD (gastroesophageal reflux disease)    does not take medications for   . Headache(784.0)    takes midodrine for migraines prn  . Meniere's syndrome   . Mental disorder    bipolar, takes saphris at hs  . Movement disorder   . PONV (postoperative nausea and vomiting)   . Raynaud's disease   . S/P radiation therapy 05/15/11 -  07/01/11   Left Breast: 4500 cGy/25 fractions with Boost to Left chest Wall/Mastectomy Scar for Toal dose of 5940 cGy  . Status post chemotherapy    4 cycles AC  . Status post chemotherapy 01/31/11 - 04/18/11   Taxol x 12 weeks    Past Surgical History:  Procedure Laterality Date  . 2 orbital fracture surgeries    . ABDOMINAL HYSTERECTOMY     uterus removed only  . BLADDER SUSPENSION     done 2005  . BREAST SURGERY     Axillary dissection  . MASTECTOMY MODIFIED RADICAL  10/23/2010   Left Dr Margot Chimes  . PORT-A-CATH REMOVAL  08/21/2011   Procedure: REMOVAL PORT-A-CATH;  Surgeon: Haywood Lasso, MD;  Location: [facility];  Service: General;  Laterality: Right;  . PORTACATH PLACEMENT  11/28/2010   via right subclavian - Dr Margot Chimes  . SINUS EXPLORATION     Family History:  Family History  Problem Relation Age of Onset  . Hypertension Mother   . Diabetes Mother   . Heart failure Father   . Cancer Maternal Aunt        breast, thyroid, colonm  . Heart attack Sister    Family Psychiatric  History: Please see H&P.  Social History:  History  Alcohol Use  . 1.7 oz/week  . 2 Glasses of wine, 1 Standard drinks or equivalent per week     History  Drug Use No    Comment: in college    Social History   Social History  . Marital status: Married    Spouse name: Linna Hoff   . Number of children: 1  . Years of education: Masters   Occupational History  . unemployed    Social History Main Topics  . Smoking status: Never Smoker  . Smokeless tobacco: Never Used  . Alcohol use 1.7 oz/week    2 Glasses of wine, 1 Standard drinks or equivalent per week  . Drug use: No     Comment: in college  . Sexual activity: Yes     Comment: erpr+/HER-2 neg.   Other Topics Concern  . None   Social History Narrative   Married to [place]   One dtr- 58 years old- [name]- in [place]   Patient has a Masters   Patient  has 1 child.    Additional Social History:   Sleep: "I slept  well last night, the best sleep I have ever had".  Appetite:  Fair  Current Medications: Current Facility-Administered Medications  Medication Dose Route Frequency Provider Last Rate Last Dose  . acetaminophen (TYLENOL) tablet 650 mg  650 mg Oral Q4H PRN Kerrie Buffalo, NP      . alum & mag hydroxide-simeth (MAALOX/MYLANTA) 200-200-20 MG/5ML suspension 30 mL  30 mL Oral Q6H PRN Kerrie Buffalo, NP      . benztropine (COGENTIN) tablet 0.5 mg  0.5 mg Oral QHS Eappen, [name] Clock, MD   0.5 mg at 08/02/16 2118  . carbamazepine (TEGRETOL) chewable tablet 400 mg  400  mg Oral BID Artist Beach, MD   400 mg at 08/03/16 0814  . cholecalciferol (VITAMIN D) tablet 1,000 Units  1,000 Units Oral Daily Kerrie Buffalo, NP   1,000 Units at 08/03/16 0815  . feeding supplement (ENSURE ENLIVE) (ENSURE ENLIVE) liquid 237 mL  237 mL Oral BID BM Derrill Center, NP   237 mL at 07/31/16 1452  . hydrOXYzine (ATARAX/VISTARIL) tablet 25 mg  25 mg Oral BID Kerrie Buffalo, NP   25 mg at 08/03/16 0815  . ibuprofen (ADVIL,MOTRIN) tablet 600 mg  600 mg Oral Q8H PRN Kerrie Buffalo, NP      . lisinopril (PRINIVIL,ZESTRIL) tablet 2.5 mg  2.5 mg Oral Daily Lindell Spar I, NP   2.5 mg at 08/03/16 0815  . magnesium hydroxide (MILK OF MAGNESIA) suspension 30 mL  30 mL Oral Daily PRN Kerrie Buffalo, NP      . OLANZapine (ZYPREXA) tablet 15 mg  15 mg Oral QHS Izediuno, Laruth Bouchard, MD   15 mg at 08/02/16 2118  . OLANZapine zydis (ZYPREXA) disintegrating tablet 5 mg  5 mg Oral Q8H PRN Derrill Center, NP      . ondansetron (ZOFRAN) tablet 4 mg  4 mg Oral Q8H PRN Kerrie Buffalo, NP      . traZODone (DESYREL) tablet 50 mg  50 mg Oral QHS Kerrie Buffalo, NP   50 mg at 08/02/16 2118    Lab Results:  No results found for this or any previous visit (from the past 48 hour(s)). Blood Alcohol level:  Lab Results  Component Value Date   ETH <5 07/26/2016   ETH (H) 05/19/2010    69        LOWEST DETECTABLE LIMIT FOR SERUM  ALCOHOL IS 5 mg/dL FOR MEDICAL PURPOSES ONLY   Metabolic Disorder Labs: Lab Results  Component Value Date   HGBA1C 5.2 07/29/2016   MPG 103 07/29/2016   MPG 103 07/28/2016   Lab Results  Component Value Date   PROLACTIN 68.0 (H) 07/29/2016   PROLACTIN 44.6 (H) 07/28/2016   Lab Results  Component Value Date   CHOL 230 (H) 07/29/2016   TRIG 92 07/29/2016   HDL 100 07/29/2016   CHOLHDL 2.3 07/29/2016   VLDL 18 07/29/2016   LDLCALC 112 (H) 07/29/2016   LDLCALC 111 (H) 03/04/2013   Physical Findings: AIMS: Facial and Oral Movements Muscles of Facial Expression: None, normal Lips and Perioral Area: None, normal Jaw: None, normal Tongue: None, normal,Extremity Movements Upper (arms, wrists, hands, fingers): None, normal Lower (legs, knees, ankles, toes): None, normal, Trunk Movements Neck, shoulders, hips: None, normal, Overall Severity Severity of abnormal movements (highest score from questions above): None, normal Incapacitation due to abnormal movements: None, normal Patient's awareness of abnormal movements (rate only patient's report): No Awareness, Dental Status Current problems with teeth and/or dentures?: No Does patient usually wear dentures?: No  CIWA:  CIWA-Ar Total: 1 COWS:  COWS Total Score: 1  Musculoskeletal: Strength & Muscle Tone: within normal limits Gait & Station: normal Patient leans: N/A  Psychiatric Specialty Exam: Physical Exam  Nursing note and vitals reviewed.   Review of Systems  Psychiatric/Behavioral: Positive for depression and hallucinations. The patient is nervous/anxious.   All other systems reviewed and are negative.   Blood pressure (!) 144/89, pulse 63, temperature 98 F (36.7 C), temperature source Oral, resp. rate 18, height 5' 7"  (1.702 m), weight 54.9 kg (121 lb), SpO2 98 %.Body mass index is 18.95 kg/m.  General Appearance: Guarded  Eye Contact:  Fair  Speech:  Normal Rate  Volume:  Normal  Mood:  Anxious  Affect:   Congruent  Thought Process:  Goal Directed and Descriptions of Associations: Circumstantial  Orientation:  Full (Time, Place, and Person)  Thought Content:  Delusions and Hallucinations: Auditory,  Suicidal Thoughts:  No  Homicidal Thoughts:  No  Memory:  Immediate;   Fair Recent;   Fair Remote;   Fair  Judgement:  Impaired  Insight:  Shallow  Psychomotor Activity:  Normal  Concentration:  Concentration: Fair and Attention Span: Fair  Recall:  [employer] of Knowledge:  Fair  Language:  Fair  Akathisia:  No  Handed:  Right  AIMS (if indicated):     Assets:  Communication Skills Desire for Improvement  ADL's:  Intact  Cognition:  WNL  Sleep:  Number of Hours: 6.25   Bipolar disorder, curr episode mixed, severe, with psychotic features ([place]) unstable  Will continue today 08/03/16  plan as below except where it is noted.  Treatment Plan Summary: Patient with bipolar diorder, continues to report psychotic symptoms. Seem calm, the attending psychiatrist readjusted medications yesterday, Vanessa Mills continues on zyprexa. Will continue treatment.  Daily contact with patient to assess and evaluate symptoms and progress in treatment and Medication management  Reviewed past medical records,treatment plan.   Tegretol 200 mg po bid for mood sx. Tegretol level on 08/01/2016, reviewed results, 3.4, within normal level.  Continue Zyprexa 15 mg po qhs for psychosis.  Continue Cogentin 0.5 mg po bid for eps.  Will continue to monitor vitals ,medication compliance and treatment side effects while patient is here.   Will monitor for medical issues as well as call consult as needed.   Reviewed labs, PL elevated - will monitor . CSW will continue working on disposition.  Patient to participate in therapeutic milieu .  Patient is now allowed to have a roommate starting today.  Nanci Pina, FNP, PMHNP, FNP-BC. 08/03/2016, 3:03 PMPatient ID: Vanessa Mills, female   DOB: 1960-04-25, 56 y.o.    MRN: [id number]

## 2016-08-03 NOTE — Progress Notes (Signed)
D:  Rheta has been up and visible on the unit.  She was noticed interacting with staff and peers.  She denied any suicidal/homicidal ideation. She continues to hear voices but she states that they are usually there.  She denies any visual hallucinations.  She was pleasant and cooperative.  She reported that her appetite is good and she has been sleeping really well.  "I actually took a nap today and I don't know that last time I took a nap."  She took her hs medications without difficulty.  She denies any pain or discomfort and appears to be in no physical distress.  She required no prn's at this time.  A:  Q 15 minute checks maintained for safety.  Support and encouragement provided.  R:  Vale remains safe on the unit.  We will continue to monitor the progress towards her goals.

## 2016-08-04 DIAGNOSIS — F29 Unspecified psychosis not due to a substance or known physiological condition: Secondary | ICD-10-CM

## 2016-08-04 NOTE — [facility] Group Notes (Signed)
[facility] Group Notes:  (Clinical Social Work)  08/04/2016  11:00AM-12:00PM  Summary of Progress/Problems:  The main focus of today's process group was to listen to a variety of genres of music and to identify that different types of music provoke different responses.  The patient then was able to identify personally what was soothing for them, as well as energizing, as well as how patient can personally use this knowledge in sleep habits, with depression, and with other symptoms.  The patient expressed at the beginning of group the overall feeling of "Ecstatic" and was labile while listening to groups, smiled and danced most of the time, but also cried at times.  She left about 15 minutes prior to the end of group, stating "goodbye" but did not explain why.d  Type of Therapy:  Music Therapy   Participation Level:  Active  Participation Quality:  Attentive   Affect:  Labile  Cognitive:  Disorganized  Insight:  Improving  Engagement in Therapy:  Improving  Modes of Intervention:   Activity, Exploration  Selmer Dominion, LCSW 08/04/2016

## 2016-08-04 NOTE — Progress Notes (Signed)
[facility] Group Notes:  (Nursing/MHT/Case Management/Adjunct)  Date:  08/04/2016  Time:  8:51 PM  Type of Therapy:  Psychoeducational Skills  Participation Level:  Active  Participation Quality:  Appropriate  Affect:  Appropriate  Cognitive:  Appropriate  Insight:  Improving  Engagement in Group:  Engaged  Modes of Intervention:  Education  Summary of Progress/Problems: The patient verbalized in group that she had a good day overall because she went outside and because she enjoyed the groups. She also read a great deal. In terms of the theme for the day, her coping skill will be to spend time with her family and friends.   Archie Balboa S 08/04/2016, 8:51 PM

## 2016-08-04 NOTE — Progress Notes (Signed)
[facility] MD Progress Note  08/04/2016 1:13 PM Vanessa Mills  MRN:  [id number]  Subjective: Vanessa Mills reports " I am doing good, Your good and we all are good." states she was quoting a line from a movie.   Objective:  Vanessa Mills is awake, alert and oriented. Seen sitting on the floor doing Yoga. Patient is clam, pleasant and cooperative.  Denies suicidal ideation or self harm. Denies homicidal ideation. Denies auditory or visual hallucination however states she hears her own voice at times " which can get confusing."   Vanessa Mills does not appear to be responding to internal stimuli. Patient repots  Interacting well with staff and others. Patient reports she is medication compliant without mediation side effects. Patient denies depression or depressive symptoms. Reports good appetite and states she is resting well. Support, encouragement and reassurance was provided.   Principal Problem: Bipolar disorder, curr episode mixed, severe, with psychotic features (Vanessa Mills) Diagnosis:   Patient Active Problem List   Diagnosis Date Noted  . Bipolar disorder, curr episode mixed, severe, with psychotic features (Vanessa Mills) [F31.64] 07/31/2016  . Hot flash due to medication [R23.2, T50.905A] 09/20/2014  . History of long-term use of multiple prescription drugs [Z92.29] 05/20/2014  . Malignant neoplasm of overlapping sites of left breast in female, estrogen receptor positive ([place]) [E10.071, Z17.0] 03/18/2014  . DOE (dyspnea on exertion) [R06.09] 09/14/2013  . Abnormal EKG [R94.31] 09/14/2013  . Essential tremor [G25.0] 06/05/2012  . Abnormal involuntary movements(781.0) [R25.9] 06/05/2011  . 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 Bipolar disorder ([place]) [F31.9] 01/30/2011  . Chemotherapy follow-up examination [Z09] 12/28/2010  . Fatigue [R53.83] 12/28/2010   Total Time spent with patient: 15 minutes  Past Psychiatric History: Please see H&P.   Past Medical History:  Past Medical History:  Diagnosis  Date  . Bipolar 1 disorder ([place])   . Breast cancer ([place]) 03/07/2011  . Breast cancer, ILC, Left, receptor+, Her2- 10/10/2010  . Chemotherapy follow-up examination 12/28/2010  . Chronic mental illness   . Crohn's disease ([place])   . Fatigue 12/28/2010  . GERD (gastroesophageal reflux disease)    does not take medications for   . Headache(784.0)    takes midodrine for migraines prn  . Meniere's syndrome   . Mental disorder    bipolar, takes saphris at hs  . Movement disorder   . PONV (postoperative nausea and vomiting)   . Raynaud's disease   . S/P radiation therapy 05/15/11 - 07/01/11   Left Breast: 4500 cGy/25 fractions with Boost to Left chest Wall/Mastectomy Scar for Toal dose of 5940 cGy  . Status post chemotherapy    4 cycles AC  . Status post chemotherapy 01/31/11 - 04/18/11   Taxol x 12 weeks    Past Surgical History:  Procedure Laterality Date  . 2 orbital fracture surgeries    . ABDOMINAL HYSTERECTOMY     uterus removed only  . BLADDER SUSPENSION     done 2005  . BREAST SURGERY     Axillary dissection  . MASTECTOMY MODIFIED RADICAL  10/23/2010   Left Dr Margot Chimes  . PORT-A-CATH REMOVAL  08/21/2011   Procedure: REMOVAL PORT-A-CATH;  Surgeon: Haywood Lasso, MD;  Location: [facility];  Service: General;  Laterality: Right;  . PORTACATH PLACEMENT  11/28/2010   via right subclavian - Dr Margot Chimes  . SINUS EXPLORATION     Family History:  Family History  Problem Relation Age of Onset  . Hypertension Mother   . Diabetes Mother   .  Heart failure Father   . Cancer Maternal Aunt        breast, thyroid, colonm  . Heart attack Sister    Family Psychiatric  History: Please see H&P.  Social History:  History  Alcohol Use  . 1.7 oz/week  . 2 Glasses of wine, 1 Standard drinks or equivalent per week     History  Drug Use No    Comment: in college    Social History   Social History  . Marital status: Married    Spouse name: Linna Hoff   . Number of children: 1   . Years of education: Masters   Occupational History  . unemployed    Social History Main Topics  . Smoking status: Never Smoker  . Smokeless tobacco: Never Used  . Alcohol use 1.7 oz/week    2 Glasses of wine, 1 Standard drinks or equivalent per week  . Drug use: No     Comment: in college  . Sexual activity: Yes     Comment: erpr+/HER-2 neg.   Other Topics Concern  . None   Social History Narrative   Married to [place]   One dtr- 56 years old- [name]- in [place]   Patient has a Masters   Patient  has 1 child.    Additional Social History:   Sleep: "I slept well last night, the best sleep I have ever had".  Appetite:  Fair  Current Medications: Current Facility-Administered Medications  Medication Dose Route Frequency Provider Last Rate Last Dose  . acetaminophen (TYLENOL) tablet 650 mg  650 mg Oral Q4H PRN Kerrie Buffalo, NP      . alum & mag hydroxide-simeth (MAALOX/MYLANTA) 200-200-20 MG/5ML suspension 30 mL  30 mL Oral Q6H PRN Kerrie Buffalo, NP      . benztropine (COGENTIN) tablet 0.5 mg  0.5 mg Oral QHS Eappen, [name] Clock, MD   0.5 mg at 08/03/16 2116  . carbamazepine (TEGRETOL) chewable tablet 400 mg  400 mg Oral BID Artist Beach, MD   400 mg at 08/04/16 0837  . cholecalciferol (VITAMIN D) tablet 1,000 Units  1,000 Units Oral Daily Kerrie Buffalo, NP   1,000 Units at 08/04/16 [phone]  . feeding supplement (ENSURE ENLIVE) (ENSURE ENLIVE) liquid 237 mL  237 mL Oral BID BM Derrill Center, NP   237 mL at 07/31/16 1452  . hydrOXYzine (ATARAX/VISTARIL) tablet 25 mg  25 mg Oral BID Kerrie Buffalo, NP   25 mg at 08/04/16 9892  . ibuprofen (ADVIL,MOTRIN) tablet 600 mg  600 mg Oral Q8H PRN Kerrie Buffalo, NP      . lisinopril (PRINIVIL,ZESTRIL) tablet 2.5 mg  2.5 mg Oral Daily Lindell Spar I, NP   2.5 mg at 08/04/16 1194  . magnesium hydroxide (MILK OF MAGNESIA) suspension 30 mL  30 mL Oral Daily PRN Kerrie Buffalo, NP      . OLANZapine (ZYPREXA) tablet 15 mg  15  mg Oral QHS Izediuno, Laruth Bouchard, MD   15 mg at 08/03/16 2116  . OLANZapine zydis (ZYPREXA) disintegrating tablet 5 mg  5 mg Oral Q8H PRN Derrill Center, NP      . ondansetron (ZOFRAN) tablet 4 mg  4 mg Oral Q8H PRN Kerrie Buffalo, NP      . traZODone (DESYREL) tablet 50 mg  50 mg Oral QHS Kerrie Buffalo, NP   50 mg at 08/03/16 2115    Lab Results:  No results found for this or any previous visit (from the past 48 hour(s)).  Blood Alcohol level:  Lab Results  Component Value Date   ETH <5 07/26/2016   ETH (H) 05/19/2010    69        LOWEST DETECTABLE LIMIT FOR SERUM ALCOHOL IS 5 mg/dL FOR MEDICAL PURPOSES ONLY   Metabolic Disorder Labs: Lab Results  Component Value Date   HGBA1C 5.2 07/29/2016   MPG 103 07/29/2016   MPG 103 07/28/2016   Lab Results  Component Value Date   PROLACTIN 68.0 (H) 07/29/2016   PROLACTIN 44.6 (H) 07/28/2016   Lab Results  Component Value Date   CHOL 230 (H) 07/29/2016   TRIG 92 07/29/2016   HDL 100 07/29/2016   CHOLHDL 2.3 07/29/2016   VLDL 18 07/29/2016   LDLCALC 112 (H) 07/29/2016   LDLCALC 111 (H) 03/04/2013   Physical Findings: AIMS: Facial and Oral Movements Muscles of Facial Expression: None, normal Lips and Perioral Area: None, normal Jaw: None, normal Tongue: None, normal,Extremity Movements Upper (arms, wrists, hands, fingers): None, normal Lower (legs, knees, ankles, toes): None, normal, Trunk Movements Neck, shoulders, hips: None, normal, Overall Severity Severity of abnormal movements (highest score from questions above): None, normal Incapacitation due to abnormal movements: None, normal Patient's awareness of abnormal movements (rate only patient's report): No Awareness, Dental Status Current problems with teeth and/or dentures?: No Does patient usually wear dentures?: No  CIWA:  CIWA-Ar Total: 1 COWS:  COWS Total Score: 1  Musculoskeletal: Strength & Muscle Tone: within normal limits Gait & Station: normal Patient  leans: N/A  Psychiatric Specialty Exam: Physical Exam  Nursing note and vitals reviewed. Constitutional: She appears well-developed.  Cardiovascular: Normal rate.   Neurological: She is alert.  Psychiatric: She has a normal mood and affect.    Review of Systems  Psychiatric/Behavioral: Positive for depression and hallucinations. The patient is nervous/anxious.   All other systems reviewed and are negative.   Blood pressure 139/80, pulse (!) 59, temperature 98.1 F (36.7 C), temperature source Oral, resp. rate 16, height 5' 7"  (1.702 m), weight 54.9 kg (121 lb), SpO2 98 %.Body mass index is 18.95 kg/m.  General Appearance: Guarded but pleasant   Eye Contact:  Fair  Speech:  Normal Rate  Volume:  Normal  Mood:  Anxious  Affect:  Congruent  Thought Process:  Goal Directed and Descriptions of Associations: Circumstantial  Orientation:  Full (Time, Place, and Person)  Thought Content:  Delusions and Hallucinations: Auditory,  Suicidal Thoughts:  No  Homicidal Thoughts:  No  Memory:  Immediate;   Fair Recent;   Fair Remote;   Fair  Judgement:  Impaired  Insight:  Shallow  Psychomotor Activity:  Normal  Concentration:  Concentration: Fair and Attention Span: Fair  Recall:  [employer] of Knowledge:  Fair  Language:  Fair  Akathisia:  No  Handed:  Right  AIMS (if indicated):     Assets:  Communication Skills Desire for Improvement Resilience Social Support  ADL's:  Intact  Cognition:  WNL  Sleep:  Number of Hours: 6.75     I agree with current treatment plan on 08/04/2016, Patient seen face-to-face for psychiatric evaluation follow-up, chart reviewed. Reviewed the information documented and agree with the treatment plan.  Bipolar disorder, curr episode mixed, severe, with psychotic features ([place]) unstable  Will continue today 08/04/16  plan as below except where it is noted.  Treatment Plan Summary:  Daily contact with patient to assess and evaluate symptoms and  progress in treatment and Medication management  Tegretol 200 mg po  bid for mood sx. Tegretol level on 08/01/2016, reviewed results, 3.4, within normal level. Continue Zyprexa 15 mg po qhs for psychosis. Continue Cogentin 0.5 mg po bid for eps.  Will continue to monitor vitals ,medication compliance and treatment side effects while patient is here.   Will monitor for medical issues as well as call consult as needed.   Reviewed labs, PL elevated - will monitor . CSW will continue working on disposition.  Patient to participate in therapeutic milieu .  Patient is now allowed to have a roommate starting today.  Derrill Center, NP 08/04/2016, 1:13 PM

## 2016-08-05 MED ORDER — OLANZAPINE 5 MG PO TBDP
5.0000 mg | ORAL_TABLET | Freq: Every day | ORAL | Status: DC
  Administered 2016-08-05 – 2016-08-06 (×2): 5 mg via ORAL
  Filled 2016-08-05 (×4): qty 1

## 2016-08-05 NOTE — Progress Notes (Signed)
D: Pt at the time of assessment was flat isolative and withdrawn to self. Pt at the time denied pain, depression, anxiety, SI, HI or AVH; "I am good." Pt appeared to be anxious, as she would not enter the dayroom several times until it was time for group.  A: Medications offered as prescribed. All patient's questions and concerns addressed. Support, encouragement, and safe environment provided. 15-minute safety checks continue. R: Pt was med compliant. Pt attended wrap-up group. Safety checks continue.

## 2016-08-05 NOTE — Progress Notes (Signed)
[facility] MD Progress Note  08/05/2016 11:46 AM RUTHANNE MCNEISH  MRN:  [id number]  Subjective: Arminta reports " I was hoping to leave today, because today is my anniversary"  Objective:  ELISABETTA MISHRA seen sitting in the hall, interacting with peers. Patient reports she is feeling much better however states " I am not feeling the effects of the medications any more." Patient reports auditory hallucination that are chronic. Reports the voices never "really go away."  Patient denies that the voices are command in nature. Reports more of a mumbling. Denies suicidal ideation, self harm or homicidal ideation.  [place] does not appear to be responding to internal stimuli during this assessment. Patient continues to states she is tolerating her medications well. Reports she is sleeping better than ever, since her medication has been adjusted. Patient reports she if followed by MD Wylene Simmer and will keep her follow-up appointment. Support, encouragement and reassurance was provided.   Principal Problem: Bipolar disorder, curr episode mixed, severe, with psychotic features ([place]) Diagnosis:   Patient Active Problem List   Diagnosis Date Noted  . Bipolar disorder, curr episode mixed, severe, with psychotic features ([place]) [F31.64] 07/31/2016  . Hot flash due to medication [R23.2, T50.905A] 09/20/2014  . History of long-term use of multiple prescription drugs [Z92.29] 05/20/2014  . Malignant neoplasm of overlapping sites of left breast in female, estrogen receptor positive ([place]) [L24.401, Z17.0] 03/18/2014  . DOE (dyspnea on exertion) [R06.09] 09/14/2013  . Abnormal EKG [R94.31] 09/14/2013  . Essential tremor [G25.0] 06/05/2012  . Abnormal involuntary movements(781.0) [R25.9] 06/05/2011  . 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 Bipolar disorder ([place]) [F31.9] 01/30/2011  . Chemotherapy follow-up examination [Z09] 12/28/2010  . Fatigue [R53.83] 12/28/2010   Total Time spent with patient: 15  minutes  Past Psychiatric History: Please see H&P.   Past Medical History:  Past Medical History:  Diagnosis Date  . Bipolar 1 disorder ([place])   . Breast cancer ([place]) 03/07/2011  . Breast cancer, ILC, Left, receptor+, Her2- 10/10/2010  . Chemotherapy follow-up examination 12/28/2010  . Chronic mental illness   . Crohn's disease ([place])   . Fatigue 12/28/2010  . GERD (gastroesophageal reflux disease)    does not take medications for   . Headache(784.0)    takes midodrine for migraines prn  . Meniere's syndrome   . Mental disorder    bipolar, takes saphris at hs  . Movement disorder   . PONV (postoperative nausea and vomiting)   . Raynaud's disease   . S/P radiation therapy 05/15/11 - 07/01/11   Left Breast: 4500 cGy/25 fractions with Boost to Left chest Wall/Mastectomy Scar for Toal dose of 5940 cGy  . Status post chemotherapy    4 cycles AC  . Status post chemotherapy 01/31/11 - 04/18/11   Taxol x 12 weeks    Past Surgical History:  Procedure Laterality Date  . 2 orbital fracture surgeries    . ABDOMINAL HYSTERECTOMY     uterus removed only  . BLADDER SUSPENSION     done 2005  . BREAST SURGERY     Axillary dissection  . MASTECTOMY MODIFIED RADICAL  10/23/2010   Left Dr Margot Chimes  . PORT-A-CATH REMOVAL  08/21/2011   Procedure: REMOVAL PORT-A-CATH;  Surgeon: Haywood Lasso, MD;  Location: [facility];  Service: General;  Laterality: Right;  . PORTACATH PLACEMENT  11/28/2010   via right subclavian - Dr Margot Chimes  . SINUS EXPLORATION     Family History:  Family History  Problem Relation  Age of Onset  . Hypertension Mother   . Diabetes Mother   . Heart failure Father   . Cancer Maternal Aunt        breast, thyroid, colonm  . Heart attack Sister    Family Psychiatric  History: Please see H&P.  Social History:  History  Alcohol Use  . 1.7 oz/week  . 2 Glasses of wine, 1 Standard drinks or equivalent per week     History  Drug Use No    Comment: in college     Social History   Social History  . Marital status: Married    Spouse name: Dan   . Number of children: 1  . Years of education: Masters   Occupational History  . unemployed    Social History Main Topics  . Smoking status: Never Smoker  . Smokeless tobacco: Never Used  . Alcohol use 1.7 oz/week    2 Glasses of wine, 1 Standard drinks or equivalent per week  . Drug use: No     Comment: in college  . Sexual activity: Yes     Comment: erpr+/HER-2 neg.   Other Topics Concern  . None   Social History Narrative   Married to Dan   One dtr- 58 years old- [place]- in [place]   Patient has a Masters   Patient  has 1 child.    Additional Social History:   Sleep: Good  Appetite:  Fair  Current Medications: Current Facility-Administered Medications  Medication Dose Route Frequency Provider Last Rate Last Dose  . acetaminophen (TYLENOL) tablet 650 mg  650 mg Oral Q4H PRN Agustin, Sheila, NP      . alum & mag hydroxide-simeth (MAALOX/MYLANTA) 200-200-20 MG/5ML suspension 30 mL  30 mL Oral Q6H PRN Agustin, Sheila, NP      . benztropine (COGENTIN) tablet 0.5 mg  0.5 mg Oral QHS Eappen, Saramma, MD   0.5 mg at 08/04/16 2212  . carbamazepine (TEGRETOL) chewable tablet 400 mg  400 mg Oral BID Izediuno, Vincent A, MD   400 mg at 08/05/16 0808  . cholecalciferol (VITAMIN D) tablet 1,000 Units  1,000 Units Oral Daily Agustin, Sheila, NP   1,000 Units at 08/05/16 0809  . hydrOXYzine (ATARAX/VISTARIL) tablet 25 mg  25 mg Oral BID Agustin, Sheila, NP   25 mg at 08/05/16 0808  . ibuprofen (ADVIL,MOTRIN) tablet 600 mg  600 mg Oral Q8H PRN Agustin, Sheila, NP      . lisinopril (PRINIVIL,ZESTRIL) tablet 2.5 mg  2.5 mg Oral Daily Nwoko, Agnes I, NP   2.5 mg at 08/05/16 0808  . magnesium hydroxide (MILK OF MAGNESIA) suspension 30 mL  30 mL Oral Daily PRN Agustin, Sheila, NP      . OLANZapine (ZYPREXA) tablet 15 mg  15 mg Oral QHS Izediuno, Vincent A, MD   15 mg at 08/04/16 2212  .  OLANZapine zydis (ZYPREXA) disintegrating tablet 5 mg  5 mg Oral Q8H PRN ,  N, NP      . ondansetron (ZOFRAN) tablet 4 mg  4 mg Oral Q8H PRN Agustin, Sheila, NP      . traZODone (DESYREL) tablet 50 mg  50 mg Oral QHS Agustin, Sheila, NP   50 mg at 08/04/16 2212    Lab Results:  No results found for this or any previous visit (from the past 48 hour(s)). Blood Alcohol level:  Lab Results  Component Value Date   ETH <5 07/26/2016   ETH (H) 05/19/2010    69          LOWEST DETECTABLE LIMIT FOR SERUM ALCOHOL IS 5 mg/dL FOR MEDICAL PURPOSES ONLY   Metabolic Disorder Labs: Lab Results  Component Value Date   HGBA1C 5.2 07/29/2016   MPG 103 07/29/2016   MPG 103 07/28/2016   Lab Results  Component Value Date   PROLACTIN 68.0 (H) 07/29/2016   PROLACTIN 44.6 (H) 07/28/2016   Lab Results  Component Value Date   CHOL 230 (H) 07/29/2016   TRIG 92 07/29/2016   HDL 100 07/29/2016   CHOLHDL 2.3 07/29/2016   VLDL 18 07/29/2016   LDLCALC 112 (H) 07/29/2016   LDLCALC 111 (H) 03/04/2013   Physical Findings: AIMS: Facial and Oral Movements Muscles of Facial Expression: None, normal Lips and Perioral Area: None, normal Jaw: None, normal Tongue: None, normal,Extremity Movements Upper (arms, wrists, hands, fingers): None, normal Lower (legs, knees, ankles, toes): None, normal, Trunk Movements Neck, shoulders, hips: None, normal, Overall Severity Severity of abnormal movements (highest score from questions above): None, normal Incapacitation due to abnormal movements: None, normal Patient's awareness of abnormal movements (rate only patient's report): No Awareness, Dental Status Current problems with teeth and/or dentures?: No Does patient usually wear dentures?: No  CIWA:  CIWA-Ar Total: 1 COWS:  COWS Total Score: 1  Musculoskeletal: Strength & Muscle Tone: within normal limits Gait & Station: normal Patient leans: N/A  Psychiatric Specialty Exam: Physical Exam  Nursing  note and vitals reviewed. Constitutional: She is oriented to person, place, and time. She appears well-developed.  Cardiovascular: Normal rate.   Neurological: She is alert and oriented to person, place, and time.  Psychiatric: She has a normal mood and affect.    Review of Systems  Psychiatric/Behavioral: Positive for hallucinations. The patient is nervous/anxious.   All other systems reviewed and are negative.   Blood pressure (!) 160/85, pulse (!) 59, temperature 98.8 F (37.1 C), resp. rate 16, height 5' 7" (1.702 m), weight 54.9 kg (121 lb), SpO2 98 %.Body mass index is 18.95 kg/m.  General Appearance: Casual and Well Groomed and pleasant   Eye Contact:  Fair  Speech:  Normal Rate  Volume:  Normal  Mood:  Anxious  Affect:  Congruent  Thought Process:  Goal Directed and Descriptions of Associations: Circumstantial  Orientation:  Full (Time, Place, and Person)  Thought Content:  Delusions and Hallucinations: Auditory,  Suicidal Thoughts:  No  Homicidal Thoughts:  No  Memory:  Immediate;   Fair Recent;   Fair Remote;   Fair  Judgement:  Impaired  Insight:  Shallow  Psychomotor Activity:  Normal  Concentration:  Concentration: Fair and Attention Span: Fair  Recall:  [employer] of Knowledge:  Fair  Language:  Fair  Akathisia:  No  Handed:  Right  AIMS (if indicated):     Assets:  Desire for Improvement Intimacy Resilience Social Support  ADL's:  Intact  Cognition:  WNL  Sleep:  Number of Hours: 6     I agree with current treatment plan on 08/05/2016, Patient seen face-to-face for psychiatric evaluation follow-up, chart reviewed. Case discussed with MD Dwyane Dee and Treatment team.  Reviewed the information documented and agree with the treatment plan.  Bipolar disorder, curr episode mixed, severe, with psychotic features ([place]) unstable  Will continue today 08/05/16  plan as below except where it is noted.  Treatment Plan Summary:  Daily contact with patient to assess  and evaluate symptoms and progress in treatment and Medication management  Continue Tegretol 200 mg po bid for mood sx. Tegretol level on 08/01/2016,  reviewed results, 3.4, within normal level. Start Zyprexa 5 mg PO Q day and continue Zyprexa 15 mg po qhs for psychosis/hallucination. Continue Cogentin 0.5 mg po bid for eps.  Will continue to monitor vitals ,medication compliance and treatment side effects while patient is here.  Will monitor for medical issues as well as call consult as needed.  Reviewed labs, PL elevated - will monitor . CSW will continue working on disposition.  Patient to participate in therapeutic milieu .  Patient is now allowed to have a roommate starting today.  Derrill Center, NP 08/05/2016, 11:46 AM

## 2016-08-05 NOTE — Tx Team (Signed)
Interdisciplinary Treatment and Diagnostic Plan Update  08/05/2016 Time of Session: 9:30am Vanessa Mills MRN: [id number]  Principal Diagnosis: Bipolar disorder, curr episode mixed, severe, with psychotic features ([place])  Secondary Diagnoses: Principal Problem:   Bipolar disorder, curr episode mixed, severe, with psychotic features ([place])   Current Medications:  Current Facility-Administered Medications  Medication Dose Route Frequency Provider Last Rate Last Dose  . acetaminophen (TYLENOL) tablet 650 mg  650 mg Oral Q4H PRN Kerrie Buffalo, NP      . alum & mag hydroxide-simeth (MAALOX/MYLANTA) 200-200-20 MG/5ML suspension 30 mL  30 mL Oral Q6H PRN Kerrie Buffalo, NP      . benztropine (COGENTIN) tablet 0.5 mg  0.5 mg Oral QHS Eappen, [name] Clock, MD   0.5 mg at 08/04/16 2212  . carbamazepine (TEGRETOL) chewable tablet 400 mg  400 mg Oral BID Artist Beach, MD   400 mg at 08/05/16 0808  . cholecalciferol (VITAMIN D) tablet 1,000 Units  1,000 Units Oral Daily Kerrie Buffalo, NP   1,000 Units at 08/05/16 0809  . hydrOXYzine (ATARAX/VISTARIL) tablet 25 mg  25 mg Oral BID Kerrie Buffalo, NP   25 mg at 08/05/16 5400  . ibuprofen (ADVIL,MOTRIN) tablet 600 mg  600 mg Oral Q8H PRN Kerrie Buffalo, NP      . lisinopril (PRINIVIL,ZESTRIL) tablet 2.5 mg  2.5 mg Oral Daily Lindell Spar I, NP   2.5 mg at 08/05/16 0808  . magnesium hydroxide (MILK OF MAGNESIA) suspension 30 mL  30 mL Oral Daily PRN Kerrie Buffalo, NP      . OLANZapine (ZYPREXA) tablet 15 mg  15 mg Oral QHS Izediuno, Laruth Bouchard, MD   15 mg at 08/04/16 2212  . OLANZapine zydis (ZYPREXA) disintegrating tablet 5 mg  5 mg Oral Daily Derrill Center, NP   5 mg at 08/05/16 1259  . ondansetron (ZOFRAN) tablet 4 mg  4 mg Oral Q8H PRN Kerrie Buffalo, NP      . traZODone (DESYREL) tablet 50 mg  50 mg Oral QHS Kerrie Buffalo, NP   50 mg at 08/04/16 2212    PTA Medications: Prescriptions Prior to Admission  Medication Sig Dispense Refill  Last Dose  . ALPRAZolam (XANAX) 0.5 MG tablet Take 0.5 mg by mouth at bedtime as needed for anxiety.   07/25/2016 at Unknown time  . carbamazepine (TEGRETOL) 100 MG chewable tablet Chew 100 mg by mouth 2 (two) times daily.  0 07/26/2016 at Unknown time  . cholecalciferol (VITAMIN D) 1000 units tablet Take 1 tablet (1,000 Units total) by mouth daily. 100 tablet 6 Past Week at Unknown time  . hydrALAZINE (APRESOLINE) 25 MG tablet Take 1 tablet (25 mg total) by mouth 3 (three) times daily. (Patient not taking: Reported on 07/26/2016) 90 tablet 11 Not Taking at Unknown time  . letrozole (FEMARA) 2.5 MG tablet TAKE 1 TABLET(2.5 MG) BY MOUTH DAILY (Patient not taking: Reported on 07/26/2016) 30 tablet 11 Not Taking at Unknown time    Treatment Modalities: Medication Management, Group therapy, Case management,  1 to 1 session with clinician, Psychoeducation, Recreational therapy.  Patient Stressors:  Patient denies stressors  Patient Strengths:  Supportive husband/ family, stable housing, stable outpatient providers  Physician Treatment Plan for Primary Diagnosis: Bipolar disorder, curr episode mixed, severe, with psychotic features ([place]) Long Term Goal(s): Improvement in symptoms so as ready for discharge  Short Term Goals: Ability to identify changes in lifestyle to reduce recurrence of condition will improve Ability to verbalize feelings will improve Ability to  demonstrate self-control will improve Ability to identify and develop effective coping behaviors will improve Compliance with prescribed medications will improve  Medication Management: Evaluate patient's response, side effects, and tolerance of medication regimen.  Therapeutic Interventions: 1 to 1 sessions, Unit Group sessions and Medication administration.  Evaluation of Outcomes: Progressing  Physician Treatment Plan for Secondary Diagnosis: Principal Problem:   Bipolar disorder, curr episode mixed, severe, with psychotic features  ([place])   Long Term Goal(s): Improvement in symptoms so as ready for discharge  Short Term Goals: Ability to identify changes in lifestyle to reduce recurrence of condition will improve Ability to verbalize feelings will improve Ability to demonstrate self-control will improve Ability to identify and develop effective coping behaviors will improve Compliance with prescribed medications will improve  Medication Management: Evaluate patient's response, side effects, and tolerance of medication regimen.  Therapeutic Interventions: 1 to 1 sessions, Unit Group sessions and Medication administration.  Evaluation of Outcomes: Progressing   RN Treatment Plan for Primary Diagnosis: Bipolar disorder, curr episode mixed, severe, with psychotic features ([place]) Long Term Goal(s): Knowledge of disease and therapeutic regimen to maintain health will improve  Short Term Goals: Ability to verbalize feelings will improve, Ability to disclose and discuss suicidal ideas and Ability to identify and develop effective coping behaviors will improve  Medication Management: RN will administer medications as ordered by provider, will assess and evaluate patient's response and provide education to patient for prescribed medication. RN will report any adverse and/or side effects to prescribing provider.  Therapeutic Interventions: 1 on 1 counseling sessions, Psychoeducation, Medication administration, Evaluate responses to treatment, Monitor vital signs and CBGs as ordered, Perform/monitor CIWA, COWS, AIMS and Fall Risk screenings as ordered, Perform wound care treatments as ordered.  Evaluation of Outcomes: Progressing   LCSW Treatment Plan for Primary Diagnosis: Bipolar disorder, curr episode mixed, severe, with psychotic features ([place]) Long Term Goal(s): Safe transition to appropriate next level of care at discharge, Engage patient in therapeutic group addressing interpersonal concerns.  Short Term Goals: Engage  patient in aftercare planning with referrals and resources, Identify triggers associated with mental health/substance abuse issues and Increase skills for wellness and recovery  Therapeutic Interventions: Assess for all discharge needs, 1 to 1 time with Social worker, Explore available resources and support systems, Assess for adequacy in community support network, Educate family and significant other(s) on suicide prevention, Complete Psychosocial Assessment, Interpersonal group therapy.  Evaluation of Outcomes: Progressing   Progress in Treatment: Attending groups: Intermittent attendance Participating in groups: Minimally; continues to be labile and impulsive Taking medication as prescribed: Yes, MD continues to assess for medication changes as needed Toleration medication: Yes, no side effects reported at this time Family/Significant other contact made: CSW [facility] spoke w husband Patient understands diagnosis: Limited insight Discussing patient identified problems/goals with staff: Yes Medical problems stabilized or resolved: Yes Denies suicidal/homicidal ideation: Yes Issues/concerns per patient self-inventory: None Other: N/A  New problem(s) identified: None identified at this time.   New Short Term/Long Term Goal(s): None identified at this time.   Discharge Plan or Barriers: Pt will return home and follow-up with outpatient services.  7/9:  Patient has aftercare arrangements in place.  Team will focus on patient education to increase compliance w treatment regimen  Reason for Continuation of Hospitalization: Anxiety Hallucinations Mania Medication stabilization  Estimated Length of Stay: 3-5 days; Est DC date 7/10  Attendees: Patient: 08/05/2016  4:53 PM  Physician: Hampton Abbot MD 08/05/2016  4:53 PM  Nursing: Ilda Basset; RN 08/05/2016  4:53 PM  RN Care Manager:  08/05/2016  4:53 PM  Social Worker: Edwyna Shell, LCSW 08/05/2016  4:53 PM  Recreational Therapist:  Marjette.LRT  08/05/2016  4:53 PM  Other:  08/05/2016  4:53 PM  Other:  08/05/2016  4:53 PM  Other: 08/05/2016  4:53 PM    Scribe for Treatment Team: Edwyna Shell, LCSW 08/05/2016 4:53 PM

## 2016-08-05 NOTE — Progress Notes (Signed)
Recreation Therapy Notes  Date: 08/05/16 Time: 1000 Location: 500 Hall Dayroom  Group Topic: Coping Skills  Goal Area(s) Addresses:  Patients will be able to identify benefits of coping skills. Patients will be able to identify positive coping skills. Patients will be able to identify benefits of using coping skills post d/c.  Behavioral Response: Minimal  Intervention: Pencils, worksheet, white board, dry erase marker, eraser  Activity: Mind map.  Patients were given a blank worksheet of a mind map.  Patients and LRT filled out the first eight boxes together with situations that would require the use of coping skills.  Patients came up with finances, death, misunderstandings, depression, body pain, anger, drug abuse and hygiene.  Patients were then given the opportunity to come up with three coping skills for each situation on their own before coming back together as a group to list coping skills on the board.  Education: Radiographer, therapeutic, Dentist.   Education Outcome: Acknowledges understanding/In group clarification offered/Needs additional education.   Clinical Observations/Feedback: Pt was quiet but answered when prompted.  Pt was also trying to assist her peer.  Pt did name one coping skill of "don't argue".     Victorino Sparrow, LRT/CTRS         [name], Branden Shallenberger A 08/05/2016 11:40 AM

## 2016-08-05 NOTE — [facility] Group Notes (Signed)
[place] LCSW Group Therapy  08/05/2016 4:28 PM  Type of Therapy:  Group Therapy  Participation Level:  Did Not Attend  Modes of Intervention:  Discussion, Education, Socialization and Support  Summary of Progress/Problems: Patients were encouraged to define what community means to them. They were encouraged to identify formal and informal support within their community.   [place] MSW, LCSW  08/05/2016, 4:28 PM

## 2016-08-05 NOTE — Progress Notes (Signed)
DAR NOTE: Patient presents with flat affect and anxious mood.  Denies pain, suicidal thoughts, auditory and visual hallucinations.  Described energy level as normal and concentration as good. Rates depression at 0, hopelessness at 0, and anxiety at 0.  Maintained on routine safety checks.  Medications given as prescribed.  Support and encouragement offered as needed.  Attended group and participated.  States goal for today is "my discharge."  Patient is visible in the milieu and on the unit.  Patient seen socializing with peers in the dayroom.  Offered no complaint.

## 2016-08-05 NOTE — Progress Notes (Signed)
Did not attend group 

## 2016-08-06 MED ORDER — OLANZAPINE 15 MG PO TABS: 15.0000 mg | ORAL_TABLET | Freq: Every day | ORAL | 0 refills | Status: DC

## 2016-08-06 MED ORDER — CARBAMAZEPINE 100 MG PO CHEW: 400.0000 mg | CHEWABLE_TABLET | Freq: Two times a day (BID) | ORAL | 0 refills | Status: DC

## 2016-08-06 MED ORDER — BENZTROPINE MESYLATE 0.5 MG PO TABS: 0.5000 mg | ORAL_TABLET | Freq: Every day | ORAL | 0 refills | Status: DC

## 2016-08-06 MED ORDER — TRAZODONE HCL 50 MG PO TABS: 50.0000 mg | ORAL_TABLET | Freq: Every day | ORAL | 0 refills | Status: DC

## 2016-08-06 MED ORDER — HYDROXYZINE HCL 25 MG PO TABS: 25.0000 mg | ORAL_TABLET | Freq: Two times a day (BID) | ORAL | 0 refills | Status: DC

## 2016-08-06 MED ORDER — LISINOPRIL 2.5 MG PO TABS: 2.5000 mg | ORAL_TABLET | Freq: Every day | ORAL | 0 refills | Status: DC

## 2016-08-06 MED ORDER — VITAMIN D 1000 UNITS PO TABS: 1000.0000 [IU] | ORAL_TABLET | Freq: Every day | ORAL | 0 refills | Status: DC

## 2016-08-06 MED ORDER — OLANZAPINE 5 MG PO TBDP
5.0000 mg | ORAL_TABLET | Freq: Every day | ORAL | 0 refills | Status: DC
Start: 2016-08-07 — End: 2017-01-06
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
## 2016-08-06 NOTE — Progress Notes (Signed)
Recreation Therapy Notes  Date: 08/06/16 Time: 1000 Location: 500 Hall Dayroom    Group Topic: Wellness  Goal Area(s) Addresses:  Patient will define components of whole wellness. Patient will verbalize benefit of whole wellness.  Behavioral Response: Engaged  Intervention:  2 Decks of cards   Activity: Deck of Chance.  Patients were given 2 cards from one deck of cards.  From a separate deck, LRT would pull a card from that deck.  Whatever card was pulled, the patient or patients with the same card would have to do the exercise that corresponds with that card.  Education: Wellness, Dentist.   Education Outcome: Acknowledges education/In group clarification offered/Needs additional education.   Clinical Observations/Feedback:  Pt was quiet but active during group.  Pt would smile at times.  Pt showed enthusiasm in completing the exercises in group.   Victorino Sparrow, LRT/CTRS        [name], Dasie Chancellor A 08/06/2016 11:11 AM

## 2016-08-06 NOTE — Progress Notes (Signed)
D: Patient spent more time in her room. Verbalizes no concern. Denies pain, SI/HI, AH/VH at this time. Accepted her bedtime med. No behavior issues noted.   A: Staff offered support and encouragement as needed. Routine safety checks maintained. Will continue to monitor patient.  R: Patient remains safe on unit.

## 2016-08-06 NOTE — Progress Notes (Signed)
Vanessa Mills is readied for dc as dc plans are reviewed with her today. She denies SI, states she was never SI and says she " does not need" to complete suicide safety plan. Pt is given cc of AVS, SRA and transition record. ALl belongings are returned to her and she was escorted to bldg entrance.

## 2016-08-06 NOTE — BHH Suicide Risk Assessment (Signed)
[facility] Discharge Suicide Risk Assessment   Principal Problem: Bipolar disorder, curr episode mixed, severe, with psychotic features [facility]) Discharge Diagnoses:  Patient Active Problem List   Diagnosis Date Noted  . Bipolar disorder, curr episode mixed, severe, with psychotic features ([place]) [F31.64] 07/31/2016  . Hot flash due to medication [R23.2, T50.905A] 09/20/2014  . History of long-term use of multiple prescription drugs [Z92.29] 05/20/2014  . Malignant neoplasm of overlapping sites of left breast in female, estrogen receptor positive ([place]) [Y09.983, Z17.0] 03/18/2014  . DOE (dyspnea on exertion) [R06.09] 09/14/2013  . Abnormal EKG [R94.31] 09/14/2013  . Essential tremor [G25.0] 06/05/2012  . Abnormal involuntary movements(781.0) [R25.9] 06/05/2011  . 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 Bipolar disorder ([place]) [F31.9] 01/30/2011  . Chemotherapy follow-up examination [Z09] 12/28/2010  . Fatigue [R53.83] 12/28/2010   Patient is a 56 year old female admitted to [facility] as a walk in due to being noncompliant with the medications, becoming manic and hearing voices of Jesus speaking to her  This morning, patient reports that she is doing much better. She adds that her mood is stable, denies any suicidal thoughts, homicidal thoughts, any hallucinations. She adds that she has an outpatient psychiatrist who she sees regularly. She states that she does not see a therapist but does not feel she needs to. She adds that she plans to take her medications regularly and keep her follow-up appointments. She also reports that her husband is supportive. Patient denies any grandiosity, racing thoughts, any paranoia Total Time spent with patient: 30 minutes  Musculoskeletal: Strength & Muscle Tone: within normal limits Gait & Station: normal Patient leans: N/A  Psychiatric Specialty Exam: Review of Systems  Constitutional: Negative.  Negative for fever and malaise/fatigue.  HENT: Negative.  Negative  for congestion and sore throat.   Eyes: Negative.  Negative for blurred vision, double vision, discharge and redness.  Respiratory: Negative.  Negative for cough, shortness of breath and wheezing.   Cardiovascular: Negative.  Negative for chest pain and palpitations.  Gastrointestinal: Negative.  Negative for abdominal pain, constipation, diarrhea, heartburn, nausea and vomiting.  Genitourinary: Negative.  Negative for dysuria.  Musculoskeletal: Negative.  Negative for falls and myalgias.  Neurological: Negative.  Negative for dizziness, seizures, loss of consciousness, weakness and headaches.  Endo/Heme/Allergies: Negative.  Negative for environmental allergies.  Psychiatric/Behavioral: Negative.  Negative for depression, hallucinations, memory loss, substance abuse and suicidal ideas. The patient is not nervous/anxious and does not have insomnia.     Blood pressure (!) 145/79, pulse 64, temperature 98 F (36.7 C), temperature source Oral, resp. rate 16, height 5' 7"  (1.702 m), weight 54.9 kg (121 lb), SpO2 98 %.Body mass index is 18.95 kg/m.  General Appearance: Casual  Eye Contact::  Fair  Speech:  Clear and Coherent and Normal Rate409  Volume:  Normal  Mood:  Euthymic  Affect:  Congruent and Full Range  Thought Process:  Coherent, Goal Directed and Descriptions of Associations: Intact  Orientation:  Full (Time, Place, and Person)  Thought Content:  WDL  Suicidal Thoughts:  No  Homicidal Thoughts:  No  Memory:  Immediate;   Fair Recent;   Fair Remote;   Fair  Judgement:  Intact  Insight:  Fair  Psychomotor Activity:  Normal  Concentration:  Fair  Recall:  [employer] of Knowledge:Fair  Language: Fair  Akathisia:  No  Handed:  Right  AIMS (if indicated):     Assets:  Communication Skills Housing Social Support Transportation  Sleep:  Number of Hours: 6  Cognition: WNL  ADL's:  Intact   Mental Status Per Nursing Assessment::   On Admission:     Demographic Factors:   NA  Loss Factors: NA  Historical Factors: NA  Risk Reduction Factors:   Living with another person, especially a relative  Continued Clinical Symptoms:  Previous Psychiatric Diagnoses and Treatments  Cognitive Features That Contribute To Risk:  None    Suicide Risk:  Minimal: No identifiable suicidal ideation.  Patients presenting with no risk factors but with morbid ruminations; may be classified as minimal risk based on the severity of the depressive symptoms  Follow-up Information    Chucky May, MD Follow up on 08/08/2016.   Specialty:  Psychiatry Why:  7/12 at 12:00pm for therapy with Pamala Hurry. Please arrive at 11:45am. 7/18 at 9:15am with Dr. Toy Care for medication management.  Contact information: [address] [place] [address] [phone]           Plan Of Care/Follow-up recommendations:  Activity:  as tolerated Diet:  regular Other:  keep follow up appointment  Hampton Abbot, MD 08/06/2016, 12:37 PM

## 2016-08-06 NOTE — Progress Notes (Signed)
  [facility] Adult Case Management Discharge Plan :  Will you be returning to the same living situation after discharge:  Yes,  Pt returning home with family At discharge, do you have transportation home?: Yes,  Pt husband to pick up Do you have the ability to pay for your medications: Yes,  Pt provided with prescriptions  Release of information consent forms completed and in the chart;  Patient's signature needed at discharge.  Patient to Follow up at: Follow-up Information    Chucky May, MD Follow up on 08/08/2016.   Specialty:  Psychiatry Why:  7/12 at 12:00pm for therapy with Pamala Hurry. Please arrive at 11:45am. 7/18 at 9:15am with Dr. Toy Care for medication management.  Contact information: [address] [place] [address] [phone]           Next level of care provider has access to [place] and Suicide Prevention discussed: Yes,  with husband  Have you used any form of tobacco in the last 30 days? (Cigarettes, Smokeless Tobacco, Cigars, and/or Pipes): No  Has patient been referred to the Quitline?: N/A patient is not a smoker  Patient has been referred for addiction treatment: N/A  Gladstone Lighter 08/06/2016, 9:44 AM

## 2016-11-04 ENCOUNTER — Encounter (HOSPITAL_COMMUNITY): Payer: Self-pay | Admitting: Emergency Medicine

## 2016-11-04 ENCOUNTER — Emergency Department (EMERGENCY_DEPARTMENT_HOSPITAL)
Admission: EM | Admit: 2016-11-04 | Discharge: 2016-11-05 | Disposition: A | Discharge status: Home / Self Care | Payer: Medicare Other | Source: Home / Self Care | Attending: Emergency Medicine | Admitting: Emergency Medicine

## 2016-11-04 ENCOUNTER — Ambulatory Visit (HOSPITAL_COMMUNITY)
Admission: RE | Admit: 2016-11-04 | Discharge: 2016-11-04 | Disposition: A | Discharge status: Home / Self Care | Payer: Medicare Other | Attending: Psychiatry | Admitting: Psychiatry

## 2016-11-04 DIAGNOSIS — F319 Bipolar disorder, unspecified: Secondary | ICD-10-CM | POA: Insufficient documentation

## 2016-11-04 DIAGNOSIS — Z9221 Personal history of antineoplastic chemotherapy: Secondary | ICD-10-CM | POA: Insufficient documentation

## 2016-11-04 DIAGNOSIS — F3164 Bipolar disorder, current episode mixed, severe, with psychotic features: Secondary | ICD-10-CM | POA: Insufficient documentation

## 2016-11-04 DIAGNOSIS — Z599 Problem related to housing and economic circumstances, unspecified: Secondary | ICD-10-CM | POA: Diagnosis not present

## 2016-11-04 DIAGNOSIS — F3010 Manic episode without psychotic symptoms, unspecified: Secondary | ICD-10-CM

## 2016-11-04 DIAGNOSIS — Z79899 Other long term (current) drug therapy: Secondary | ICD-10-CM | POA: Insufficient documentation

## 2016-11-04 DIAGNOSIS — Z923 Personal history of irradiation: Secondary | ICD-10-CM | POA: Insufficient documentation

## 2016-11-04 DIAGNOSIS — Z853 Personal history of malignant neoplasm of breast: Secondary | ICD-10-CM | POA: Diagnosis not present

## 2016-11-04 DIAGNOSIS — F309 Manic episode, unspecified: Secondary | ICD-10-CM | POA: Insufficient documentation

## 2016-11-04 DIAGNOSIS — R4587 Impulsiveness: Secondary | ICD-10-CM | POA: Diagnosis not present

## 2016-11-04 DIAGNOSIS — F3130 Bipolar disorder, current episode depressed, mild or moderate severity, unspecified: Secondary | ICD-10-CM | POA: Diagnosis not present

## 2016-11-04 LAB — CBC WITH DIFFERENTIAL/PLATELET
BASOS ABS: 0 10*3/uL (ref 0.0–0.1)
BASOS PCT: 0 %
EOS ABS: 0.1 10*3/uL (ref 0.0–0.7)
Eosinophils Relative: 1 %
HCT: 40.3 % (ref 36.0–46.0)
HEMOGLOBIN: 13.1 g/dL (ref 12.0–15.0)
LYMPHS ABS: 1 10*3/uL (ref 0.7–4.0)
Lymphocytes Relative: 13 %
MCH: 31.5 pg (ref 26.0–34.0)
MCHC: 32.5 g/dL (ref 30.0–36.0)
MCV: 96.9 fL (ref 78.0–100.0)
MONO ABS: 0.5 10*3/uL (ref 0.1–1.0)
MONOS PCT: 6 %
NEUTROS PCT: 80 %
Neutro Abs: 6.2 10*3/uL (ref 1.7–7.7)
Platelets: 293 10*3/uL (ref 150–400)
RBC: 4.16 MIL/uL (ref 3.87–5.11)
RDW: 13.3 % (ref 11.5–15.5)
WBC: 7.7 10*3/uL (ref 4.0–10.5)

## 2016-11-04 LAB — COMPREHENSIVE METABOLIC PANEL
ALBUMIN: 4.2 g/dL (ref 3.5–5.0)
ALK PHOS: 92 U/L (ref 38–126)
ALT: 23 U/L (ref 14–54)
AST: 29 U/L (ref 15–41)
Anion gap: 9 (ref 5–15)
BUN: 16 mg/dL (ref 6–20)
CALCIUM: 9 mg/dL (ref 8.9–10.3)
CO2: 27 mmol/L (ref 22–32)
CREATININE: 0.9 mg/dL (ref 0.44–1.00)
Chloride: 103 mmol/L (ref 101–111)
GFR calc Af Amer: >60 mL/min (ref >60)
GFR calc non Af Amer: >60 mL/min (ref >60)
GLUCOSE: 129 mg/dL — AB (ref 65–99)
Potassium: 4 mmol/L (ref 3.5–5.1)
SODIUM: 139 mmol/L (ref 135–145)
Total Bilirubin: 0.5 mg/dL (ref 0.3–1.2)
Total Protein: 6.7 g/dL (ref 6.5–8.1)

## 2016-11-04 LAB — RAPID URINE DRUG SCREEN, HOSP PERFORMED
Amphetamines: NOT DETECTED
BARBITURATES: NOT DETECTED
BENZODIAZEPINES: NOT DETECTED
Cocaine: NOT DETECTED
Opiates: NOT DETECTED
TETRAHYDROCANNABINOL: NOT DETECTED

## 2016-11-04 LAB — ETHANOL

## 2016-11-04 MED ORDER — ARIPIPRAZOLE 10 MG PO TABS
10.0000 mg | ORAL_TABLET | Freq: Every day | ORAL | Status: DC
  Administered 2016-11-04 – 2016-11-05 (×2): 10 mg via ORAL
  Filled 2016-11-04 (×2): qty 1

## 2016-11-04 MED ORDER — OLANZAPINE 10 MG PO TBDP: 10.0000 mg | ORAL_TABLET | Freq: Two times a day (BID) | ORAL | Status: DC

## 2016-11-04 NOTE — Progress Notes (Signed)
Started Abilify 10 mg daily for psychosis with the plan to start a long term injectable.  The patient is a patient of Dr. Toy Care and wanted her recommendation which was Abilify.    Waylan Boga, PMHNP
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
## 2016-11-04 NOTE — ED Notes (Signed)
Bed: WLPT4 Expected date:  Expected time:  Means of arrival:  Comments: 

## 2016-11-04 NOTE — ED Notes (Signed)
RN spoke with Dr Gustavus Messing and PA Patriciaann Clan, who recommends pt stay overnight for revaluation in am.  Pt agrees to stay in [facility] overnight.

## 2016-11-04 NOTE — H&P (Signed)
Behavioral Health Medical Screening Exam  Vanessa Mills is an 56 y.o. female presents as walk in due to psychotic symptoms. She states "Jesus told me to have a bowel movement on the mat. My family thinks that is strange. I just do what I'm told. I've never been on medication that took the voices away." Patient meets criteria for inpatient psychiatric admission at this time and will be sent to [facility] ED for medical clearance.   Total Time spent with patient: 20 minutes  Psychiatric Specialty Exam: Physical Exam  Review of Systems  Constitutional: Negative.   HENT: Negative for ear pain and hearing loss.   Eyes: Negative for blurred vision, double vision and pain.  Cardiovascular: Negative for chest pain and palpitations.  Gastrointestinal: Negative for diarrhea, heartburn, nausea and vomiting.  Genitourinary: Negative for dysuria, frequency and urgency.  Musculoskeletal: Negative for back pain, myalgias and neck pain.  Skin: Negative for itching and rash.  Neurological: Negative for dizziness, tingling, tremors and headaches.  Psychiatric/Behavioral: Positive for depression and hallucinations.    Blood pressure (!) 171/94, pulse 62, temperature 98.3 F (36.8 C), resp. rate 18, SpO2 99 %.There is no height or weight on file to calculate BMI.  General Appearance: Casual  Eye Contact:  Fair  Speech:  Clear and Coherent  Volume:  Normal  Mood:  Anxious  Affect:  Flat  Thought Process:  Coherent  Orientation:  Full (Time, Place, and Person)  Thought Content:  Hallucinations: Auditory  Suicidal Thoughts:  No  Homicidal Thoughts:  No  Memory:  Immediate;   Good Recent;   Good Remote;   Fair  Judgement:  Fair  Insight:  Fair  Psychomotor Activity:  Normal  Concentration: Concentration: Good and Attention Span: Good  Recall:  Good  Fund of Knowledge:Good  Language: Good  Akathisia:  No  Handed:  Right  AIMS (if indicated):     Assets:  Communication Skills Desire for  Improvement Financial Resources/Insurance Housing Intimacy Leisure Time Physical Health Resilience Social Support Talents/Skills  Sleep:       Musculoskeletal: Strength & Muscle Tone: within normal limits Gait & Station: normal Patient leans: N/A  Blood pressure (!) 171/94, pulse 62, temperature 98.3 F (36.8 C), resp. rate 18, SpO2 99 %. Patient reports that she had been on blood pressure medications before but "was taken off them."   Recommendations:  Based on my evaluation the patient does not appear to have an emergency medical condition.  Elmarie Shiley, NP 11/04/2016, 10:36 AM
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
## 2016-11-04 NOTE — ED Notes (Signed)
Pt A&O x 3, no distress noted, calm & cooperative, interactive with staff.  Monitoring for safety, Q 15 min checks in effect.

## 2016-11-04 NOTE — ED Provider Notes (Signed)
[facility] DEPT Provider Note   CSN: [id number] Arrival date & time: 11/04/16  1057     History   Chief Complaint Chief Complaint  Patient presents with  . Medical Clearance  . Manic Behavior    HPI Vanessa Mills is a 56 y.o. female.  Patient states that she's not been taking her medicines and has been acting unusual lately. She stated that she had a bowel movement outside on a door mat and she also has been taking that her car could fly. Patient states he does not drive now. That she's not suicidal or homicidal.  She is supposed to take Celexa but has not taking it appropriately. Her own provider had suggested her getting once a month shots instead. She is willing to do that.   The history is provided by the patient. No language interpreter was used.  Altered Mental Status   This is a recurrent problem. The current episode started more than 1 week ago. The problem has not changed since onset.Associated symptoms include agitation. Pertinent negatives include no confusion, no seizures and no hallucinations. Risk factors include the patient not taking medications correctly. Her past medical history does not include seizures.    Past Medical History:  Diagnosis Date  . Bipolar 1 disorder ([place])   . Breast cancer ([place]) 03/07/2011  . Breast cancer, ILC, Left, receptor+, Her2- 10/10/2010  . Chemotherapy follow-up examination 12/28/2010  . Chronic mental illness   . Crohn's disease ([place])   . Fatigue 12/28/2010  . GERD (gastroesophageal reflux disease)    does not take medications for   . Headache(784.0)    takes midodrine for migraines prn  . Meniere's syndrome   . Mental disorder    bipolar, takes saphris at hs  . Movement disorder   . PONV (postoperative nausea and vomiting)   . Raynaud's disease   . S/P radiation therapy 05/15/11 - 07/01/11   Left Breast: 4500 cGy/25 fractions with Boost to Left chest Wall/Mastectomy Scar for Toal dose of 5940 cGy  . Status post  chemotherapy    4 cycles AC  . Status post chemotherapy 01/31/11 - 04/18/11   Taxol x 12 weeks    Patient Active Problem List   Diagnosis Date Noted  . Bipolar disorder, curr episode mixed, severe, with psychotic features ([place]) 07/31/2016  . Hot flash due to medication 09/20/2014  . History of long-term use of multiple prescription drugs 05/20/2014  . Malignant neoplasm of overlapping sites of left breast in female, estrogen receptor positive ([place]) 03/18/2014  . DOE (dyspnea on exertion) 09/14/2013  . Abnormal EKG 09/14/2013  . Essential tremor 06/05/2012  . Abnormal involuntary movements(781.0) 06/05/2011  . 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 Bipolar disorder ([place]) 01/30/2011  . Chemotherapy follow-up examination 12/28/2010  . Fatigue 12/28/2010    Past Surgical History:  Procedure Laterality Date  . 2 orbital fracture surgeries    . ABDOMINAL HYSTERECTOMY     uterus removed only  . BLADDER SUSPENSION     done 2005  . BREAST SURGERY     Axillary dissection  . MASTECTOMY MODIFIED RADICAL  10/23/2010   Left Dr Margot Chimes  . PORT-A-CATH REMOVAL  08/21/2011   Procedure: REMOVAL PORT-A-CATH;  Surgeon: Haywood Lasso, MD;  Location: [facility];  Service: General;  Laterality: Right;  . PORTACATH PLACEMENT  11/28/2010   via right subclavian - Dr Margot Chimes  . SINUS EXPLORATION      OB History    No data available  Home Medications    Prior to Admission medications   Medication Sig Start Date End Date Taking? Authorizing Provider  alprazolam Duanne Moron) 2 MG tablet Take 0.5 mg by mouth at bedtime. Takes 1/4 tab 10/30/16  Yes [provider]  cholecalciferol (VITAMIN D) 1000 units tablet Take 1 tablet (1,000 Units total) by mouth daily. For bone health 08/06/16  Yes Lindell Spar I, NP  OLANZapine (ZYPREXA) 20 MG tablet Take 20 mg by mouth at bedtime. 10/30/16  Yes [provider]  benztropine (COGENTIN) 0.5 MG tablet Take 1 tablet (0.5 mg total) by  mouth at bedtime. For prevention of drug induced tremors. Patient not taking: Reported on 11/04/2016 08/06/16   Lindell Spar I, NP  carbamazepine (TEGRETOL) 100 MG chewable tablet Chew 4 tablets (400 mg total) by mouth 2 (two) times daily. For mood stabilization Patient not taking: Reported on 11/04/2016 08/06/16   Lindell Spar I, NP  hydrOXYzine (ATARAX/VISTARIL) 25 MG tablet Take 1 tablet (25 mg total) by mouth 2 (two) times daily. For anxiety Patient not taking: Reported on 11/04/2016 08/06/16   Lindell Spar I, NP  lisinopril (PRINIVIL,ZESTRIL) 2.5 MG tablet Take 1 tablet (2.5 mg total) by mouth daily. For high blood pressure Patient not taking: Reported on 11/04/2016 08/07/16   Lindell Spar I, NP  OLANZapine (ZYPREXA) 15 MG tablet Take 1 tablet (15 mg total) by mouth at bedtime. For mood control Patient not taking: Reported on 11/04/2016 08/06/16   Lindell Spar I, NP  OLANZapine zydis (ZYPREXA) 5 MG disintegrating tablet Take 1 tablet (5 mg total) by mouth daily. For mood control Patient not taking: Reported on 11/04/2016 08/07/16   Lindell Spar I, NP  traZODone (DESYREL) 50 MG tablet Take 1 tablet (50 mg total) by mouth at bedtime. For sleep Patient not taking: Reported on 11/04/2016 08/06/16   Encarnacion Slates, NP    Family History Family History  Problem Relation Age of Onset  . Hypertension Mother   . Diabetes Mother   . Heart failure Father   . Cancer Maternal Aunt        breast, thyroid, colonm  . Heart attack Sister     Social History Social History  Substance Use Topics  . Smoking status: Never Smoker  . Smokeless tobacco: Never Used  . Alcohol use 1.7 oz/week    2 Glasses of wine, 1 Standard drinks or equivalent per week     Allergies   Patient has no known allergies.   Review of Systems Review of Systems  Constitutional: Negative for appetite change and fatigue.  HENT: Negative for congestion, ear discharge and sinus pressure.   Eyes: Negative for discharge.  Respiratory:  Negative for cough.   Cardiovascular: Negative for chest pain.  Gastrointestinal: Negative for abdominal pain and diarrhea.  Genitourinary: Negative for frequency and hematuria.  Musculoskeletal: Negative for back pain.  Skin: Negative for rash.  Neurological: Negative for seizures and headaches.  Psychiatric/Behavioral: Positive for agitation. Negative for confusion and hallucinations.     Physical Exam Updated Vital Signs BP (!) 160/98 (BP Location: Right Arm)   Pulse 65   Resp 16   Ht 5' 7"  (1.702 m)   Wt 54 kg (119 lb)   SpO2 100%   BMI 18.64 kg/m   Physical Exam  Constitutional: She is oriented to person, place, and time. She appears well-developed.  HENT:  Head: Normocephalic.  Eyes: Conjunctivae and EOM are normal. No scleral icterus.  Neck: Neck supple. No thyromegaly present.  Cardiovascular:  Normal rate and regular rhythm.  Exam reveals no gallop and no friction rub.   No murmur heard. Pulmonary/Chest: No stridor. She has no wheezes. She has no rales. She exhibits no tenderness.  Abdominal: She exhibits no distension. There is no tenderness. There is no rebound.  Musculoskeletal: Normal range of motion. She exhibits no edema.  Lymphadenopathy:    She has no cervical adenopathy.  Neurological: She is oriented to person, place, and time. She exhibits normal muscle tone. Coordination normal.  Skin: No rash noted. No erythema.  Psychiatric:  Patient is manic and not  not suicidal or homicidal. She is not having hallucinations     ED Treatments / Results  Labs (all labs ordered are listed, but only abnormal results are displayed) Labs Reviewed  RAPID URINE DRUG SCREEN, HOSP PERFORMED  CBC WITH DIFFERENTIAL/PLATELET  COMPREHENSIVE METABOLIC PANEL  ETHANOL    EKG  EKG Interpretation None       Radiology No results found.  Procedures Procedures (including critical care time)  Medications Ordered in ED Medications - No data to display   Initial  Impression / Assessment and Plan / ED Course  I have reviewed the triage vital signs and the nursing notes.  Pertinent labs & imaging results that were available during my care of the patient were reviewed by me and considered in my medical decision making (see chart for details).   patient will be seen by behavioral health for possible adjustment of her medicines    Final Clinical Impressions(s) / ED Diagnoses   Final diagnoses:  None    New Prescriptions New Prescriptions   No medications on file     Milton Ferguson, MD 11/04/16 1540

## 2016-11-04 NOTE — ED Notes (Signed)
Bed: [facility] Expected date:  Expected time:  Means of arrival:  Comments: 

## 2016-11-04 NOTE — ED Notes (Signed)
Pt oriented to room and unit.  Pt is pleasant and cooperative.  Pt denies S/I, H/I, and AVH.  Pt states her doctor suggest a monthly shot.  15 minute checks and video monitoring in place.

## 2016-11-05 ENCOUNTER — Inpatient Hospital Stay (HOSPITAL_COMMUNITY): Admission: AD | Admit: 2016-11-05 | Payer: Medicare Other | Source: Intra-hospital | Admitting: Psychiatry

## 2016-11-05 DIAGNOSIS — Z599 Problem related to housing and economic circumstances, unspecified: Secondary | ICD-10-CM

## 2016-11-05 DIAGNOSIS — F3130 Bipolar disorder, current episode depressed, mild or moderate severity, unspecified: Secondary | ICD-10-CM | POA: Diagnosis not present

## 2016-11-05 DIAGNOSIS — R4587 Impulsiveness: Secondary | ICD-10-CM

## 2016-11-05 MED ORDER — ARIPIPRAZOLE 10 MG PO TABS: 10.0000 mg | ORAL_TABLET | Freq: Every day | ORAL | 0 refills | Status: DC

## 2016-11-05 NOTE — Discharge Instructions (Signed)
For your behavioral health needs, you are advised to continue treatment with Chucky May, MD:       Chucky May, MD      [address]      [place], [place] [address]      [phone]

## 2016-11-05 NOTE — ED Notes (Signed)
Pt's husband called the MD to check on pt and inquire about bringing items to the hospital. Pt verbally game permission to writer to let pt know that she was still in the [facility]. Writer presented paper authorization and pt refused to sign consent. Pt reports that she wants to think about giving him authorization to talk with staff.

## 2016-11-05 NOTE — ED Notes (Signed)
Pt d/c from the hospital. All items that were brought to [facility] were returned. D/C instructions given and prescription given. Pt denies si and hi.

## 2016-11-05 NOTE — ED Notes (Signed)
Pt is in her room this morning and talked about being in the hospital earlier this year thinking that her car would fly and crashed into some trees. She said while being in the hospital for seventeen days, she realized that it was a delusion. Pt reports that she would forget to take her medication sometimes and was not consistent. Pt is requesting to take IM long acting Abilify.

## 2016-11-05 NOTE — Progress Notes (Signed)
11/05/16 1349:  LRT went to pt room, introduced self and offered activities.  Pt stated she may be interested in playing checkers.  At around 1415, pt came to nurses station and stated she was ready to play checkers.  LRT and pt went to dayroom and played two games of checkers.  Pt was pleasant and able to focus on the activity.   Victorino Sparrow, LRT/CTRS

## 2016-11-05 NOTE — BH Assessment (Signed)
[facility] Assessment Progress Note  Per Corena Pilgrim, MD, this pt would benefit from psychiatric hospitalization at this time.  Pt has been offered a bed at [facility], but has declined it, and will not sign consents, saying that she would prefer to be discharged from [facility] to follow up with Chucky May, MD, her outpatient provider.  Pt denies having thoughts of harming herself or others.  She denies experiencing hallucinations at this time.  She does not appear to be responding to internal stimuli, and does not exhibit any delusional thought at this time.  She is calm and cooperative.  This has been staffed Ethelene Hal, FNP, who has determined that pt does not meet criteria for IVC.  Pt is to be discharged from [facility] with recommendation to follow up with Dr Toy Care.  This has been included in pt's discharge instructions.  Pt's nurse, Jan, has been notified.  Jalene Mullet, [facility] Triage Specialist [phone]

## 2016-11-05 NOTE — BHH Suicide Risk Assessment (Signed)
Suicide Risk Assessment  Discharge Assessment   [facility] Discharge Suicide Risk Assessment   Principal Problem: Bipolar disorder, curr episode mixed, severe, with psychotic features [facility]) Discharge Diagnoses:  Patient Active Problem List   Diagnosis Date Noted  . Bipolar disorder, curr episode mixed, severe, with psychotic features ([place]) [F31.64] 07/31/2016  . Hot flash due to medication [R23.2, T50.905A] 09/20/2014  . History of long-term use of multiple prescription drugs [Z92.29] 05/20/2014  . Malignant neoplasm of overlapping sites of left breast in female, estrogen receptor positive ([place]) [S01.093, Z17.0] 03/18/2014  . DOE (dyspnea on exertion) [R06.09] 09/14/2013  . Abnormal EKG [R94.31] 09/14/2013  . Essential tremor [G25.0] 06/05/2012  . Abnormal involuntary movements(781.0) [R25.9] 06/05/2011  . 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 Bipolar disorder ([place]) [F31.9] 01/30/2011  . Chemotherapy follow-up examination [Z09] 12/28/2010  . Fatigue [R53.83] 12/28/2010    Total Time spent with patient: 45 minutes  Musculoskeletal: Strength & Muscle Tone: within normal limits Gait & Station: normal Patient leans: N/A  Psychiatric Specialty Exam:   Blood pressure (!) 160/102, pulse (!) 59, temperature 98.3 F (36.8 C), temperature source Oral, resp. rate 16, height 5' 7"  (1.702 m), weight 54 kg (119 lb), SpO2 100 %.Body mass index is 18.64 kg/m.  General Appearance: Casual  Eye Contact::  Good  Speech:  Clear and Coherent and Normal Rate409  Volume:  Normal  Mood:  Depressed  Affect:  Congruent  Thought Process:  Coherent, Goal Directed and Linear  Orientation:  Full (Time, Place, and Person)  Thought Content:  Logical  Suicidal Thoughts:  No  Homicidal Thoughts:  No  Memory:  Immediate;   Good Recent;   Good Remote;   Fair  Judgement:  Fair  Insight:  Fair  Psychomotor Activity:  Normal  Concentration:  Good  Recall:  Good  Fund of Knowledge:Good  Language: Good   Akathisia:  No  Handed:  Right  AIMS (if indicated):     Assets:  Communication Skills Desire for Improvement Financial Resources/Insurance Housing Physical Health Resilience Social Support  Sleep:     Cognition: WNL  ADL's:  Intact   Mental Status Per Nursing Assessment::   On Admission:   wants medication management  Demographic Factors:  Caucasian  Loss Factors: Financial problems/change in socioeconomic status  Historical Factors: Impulsivity  Risk Reduction Factors:   Sense of responsibility to family and Living with another person, especially a relative  Continued Clinical Symptoms:  Bipolar Disorder:   Depressive phase Depression:   Impulsivity  Cognitive Features That Contribute To Risk:  Closed-mindedness    Suicide Risk:  Minimal: No identifiable suicidal ideation.  Patients presenting with no risk factors but with morbid ruminations; may be classified as minimal risk based on the severity of the depressive symptoms    Plan Of Care/Follow-up recommendations:  Activity:  as tolerated Diet:  Heart healthy  Ethelene Hal, NP 11/05/2016, 3:04 PM

## 2017-01-05 ENCOUNTER — Ambulatory Visit (HOSPITAL_COMMUNITY)
Admission: RE | Admit: 2017-01-05 | Discharge: 2017-01-05 | Disposition: A | Discharge status: Home / Self Care | Payer: Medicare Other | Source: Home / Self Care | Attending: Psychiatry | Admitting: Psychiatry

## 2017-01-05 ENCOUNTER — Encounter (HOSPITAL_COMMUNITY): Payer: Self-pay | Admitting: Emergency Medicine

## 2017-01-05 ENCOUNTER — Emergency Department (HOSPITAL_COMMUNITY)
Admission: EM | Admit: 2017-01-05 | Discharge: 2017-01-07 | Disposition: A | Discharge status: Other Acute Inpatient Hospital | Payer: Medicare Other | Attending: Emergency Medicine | Admitting: Emergency Medicine

## 2017-01-05 DIAGNOSIS — Z79899 Other long term (current) drug therapy: Secondary | ICD-10-CM | POA: Insufficient documentation

## 2017-01-05 DIAGNOSIS — F3164 Bipolar disorder, current episode mixed, severe, with psychotic features: Secondary | ICD-10-CM | POA: Insufficient documentation

## 2017-01-05 DIAGNOSIS — Z853 Personal history of malignant neoplasm of breast: Secondary | ICD-10-CM | POA: Insufficient documentation

## 2017-01-05 DIAGNOSIS — Z9012 Acquired absence of left breast and nipple: Secondary | ICD-10-CM | POA: Insufficient documentation

## 2017-01-05 DIAGNOSIS — Z0289 Encounter for other administrative examinations: Secondary | ICD-10-CM | POA: Insufficient documentation

## 2017-01-05 DIAGNOSIS — F3110 Bipolar disorder, current episode manic without psychotic features, unspecified: Secondary | ICD-10-CM | POA: Diagnosis present

## 2017-01-05 LAB — CBC WITH DIFFERENTIAL/PLATELET
BASOS ABS: 0 10*3/uL (ref 0.0–0.1)
Basophils Relative: 0 %
EOS PCT: 0 %
Eosinophils Absolute: 0 10*3/uL (ref 0.0–0.7)
HEMATOCRIT: 42.1 % (ref 36.0–46.0)
HEMOGLOBIN: 13.7 g/dL (ref 12.0–15.0)
LYMPHS ABS: 1.2 10*3/uL (ref 0.7–4.0)
LYMPHS PCT: 17 %
MCH: 31.3 pg (ref 26.0–34.0)
MCHC: 32.5 g/dL (ref 30.0–36.0)
MCV: 96.1 fL (ref 78.0–100.0)
Monocytes Absolute: 0.5 10*3/uL (ref 0.1–1.0)
Monocytes Relative: 7 %
Neutro Abs: 5.4 10*3/uL (ref 1.7–7.7)
Neutrophils Relative %: 76 %
Platelets: 302 10*3/uL (ref 150–400)
RBC: 4.38 MIL/uL (ref 3.87–5.11)
RDW: 12.6 % (ref 11.5–15.5)
WBC: 7.1 10*3/uL (ref 4.0–10.5)

## 2017-01-05 LAB — URINALYSIS, ROUTINE W REFLEX MICROSCOPIC
BACTERIA UA: NONE SEEN
BILIRUBIN URINE: NEGATIVE
Glucose, UA: NEGATIVE mg/dL
KETONES UR: NEGATIVE mg/dL
LEUKOCYTES UA: NEGATIVE
Nitrite: NEGATIVE
PROTEIN: NEGATIVE mg/dL
Specific Gravity, Urine: 1.01 (ref 1.005–1.030)
pH: 7 (ref 5.0–8.0)

## 2017-01-05 LAB — RAPID URINE DRUG SCREEN, HOSP PERFORMED
Amphetamines: NOT DETECTED
BARBITURATES: NOT DETECTED
BENZODIAZEPINES: NOT DETECTED
COCAINE: NOT DETECTED
Opiates: NOT DETECTED
Tetrahydrocannabinol: NOT DETECTED

## 2017-01-05 LAB — BASIC METABOLIC PANEL
Anion gap: 8 (ref 5–15)
BUN: 11 mg/dL (ref 6–20)
CHLORIDE: 105 mmol/L (ref 101–111)
CO2: 28 mmol/L (ref 22–32)
CREATININE: 0.62 mg/dL (ref 0.44–1.00)
Calcium: 9.3 mg/dL (ref 8.9–10.3)
GFR calc Af Amer: >60 mL/min (ref >60)
GFR calc non Af Amer: >60 mL/min (ref >60)
GLUCOSE: 135 mg/dL — AB (ref 65–99)
POTASSIUM: 4.6 mmol/L (ref 3.5–5.1)
SODIUM: 141 mmol/L (ref 135–145)

## 2017-01-05 MED ORDER — ARIPIPRAZOLE 10 MG PO TABS
10.0000 mg | ORAL_TABLET | Freq: Every day | ORAL | Status: DC
  Administered 2017-01-05 – 2017-01-07 (×3): 10 mg via ORAL
  Filled 2017-01-05 (×3): qty 1

## 2017-01-05 MED ORDER — OLANZAPINE 10 MG PO TABS
20.0000 mg | ORAL_TABLET | Freq: Every day | ORAL | Status: DC
  Administered 2017-01-05 – 2017-01-06 (×2): 20 mg via ORAL
  Filled 2017-01-05 (×2): qty 2

## 2017-01-05 MED ORDER — ALPRAZOLAM 0.5 MG PO TABS
0.5000 mg | ORAL_TABLET | Freq: Every day | ORAL | Status: DC
  Administered 2017-01-05 – 2017-01-06 (×2): 0.5 mg via ORAL
  Filled 2017-01-05 (×2): qty 1

## 2017-01-05 MED ORDER — ACETAMINOPHEN 325 MG PO TABS: 650.0000 mg | ORAL_TABLET | ORAL | Status: DC | PRN

## 2017-01-05 NOTE — ED Provider Notes (Signed)
[facility] DEPT Provider Note   CSN: [id number] Arrival date & time: 01/05/17  1243     History   Chief Complaint Chief Complaint  Patient presents with  . Medical Clearance    HPI Vanessa Mills is a 56 y.o. female.  HPI Patient with history of bipolar disorder presents with concern of possible manic behavior.  Patient himself is little insight into her condition, states that her husband was worried about her behavior, brought her to behavioral health. She denies suicidal ideation, homicidal ideation, hallucinations of any sort.  Not she also denies any physical pain or discomfort.  Eventually, additional details are obtained, suggesting the patient has recently had a change in her medication. She was brought to behavioral health, then she was sent here for evaluation.   Past Medical History:  Diagnosis Date  . Bipolar 1 disorder ([place])   . Breast cancer ([place]) 03/07/2011  . Breast cancer, ILC, Left, receptor+, Her2- 10/10/2010  . Chemotherapy follow-up examination 12/28/2010  . Chronic mental illness   . Crohn's disease ([place])   . Fatigue 12/28/2010  . GERD (gastroesophageal reflux disease)    does not take medications for   . Headache(784.0)    takes midodrine for migraines prn  . Meniere's syndrome   . Mental disorder    bipolar, takes saphris at hs  . Movement disorder   . PONV (postoperative nausea and vomiting)   . Raynaud's disease   . S/P radiation therapy 05/15/11 - 07/01/11   Left Breast: 4500 cGy/25 fractions with Boost to Left chest Wall/Mastectomy Scar for Toal dose of 5940 cGy  . Status post chemotherapy    4 cycles AC  . Status post chemotherapy 01/31/11 - 04/18/11   Taxol x 12 weeks    Patient Active Problem List   Diagnosis Date Noted  . Bipolar disorder, curr episode mixed, severe, with psychotic features ([place]) 07/31/2016  . Hot flash due to medication 09/20/2014  . History of long-term use of multiple  prescription drugs 05/20/2014  . Malignant neoplasm of overlapping sites of left breast in female, estrogen receptor positive ([place]) 03/18/2014  . DOE (dyspnea on exertion) 09/14/2013  . Abnormal EKG 09/14/2013  . Essential tremor 06/05/2012  . Abnormal involuntary movements(781.0) 06/05/2011  . 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 Bipolar disorder ([place]) 01/30/2011  . Chemotherapy follow-up examination 12/28/2010  . Fatigue 12/28/2010    Past Surgical History:  Procedure Laterality Date  . 2 orbital fracture surgeries    . ABDOMINAL HYSTERECTOMY     uterus removed only  . BLADDER SUSPENSION     done 2005  . BREAST SURGERY     Axillary dissection  . MASTECTOMY MODIFIED RADICAL  10/23/2010   Left Dr Margot Chimes  . PORT-A-CATH REMOVAL  08/21/2011   Procedure: REMOVAL PORT-A-CATH;  Surgeon: Haywood Lasso, MD;  Location: [facility];  Service: General;  Laterality: Right;  . PORTACATH PLACEMENT  11/28/2010   via right subclavian - Dr Margot Chimes  . SINUS EXPLORATION      OB History    No data available       Home Medications    Prior to Admission medications   Medication Sig Start Date End Date Taking? Authorizing Provider  alprazolam Duanne Moron) 2 MG tablet Take 0.5 mg by mouth at bedtime. Takes 1/4 tab 10/30/16   [provider]  ARIPiprazole (ABILIFY) 10 MG tablet Take 1 tablet (10 mg total) by mouth daily. 11/06/16   Ethelene Hal, NP  benztropine (COGENTIN) 0.5 MG tablet Take 1 tablet (0.5 mg total) by mouth at bedtime. For prevention of drug induced tremors. Patient not taking: Reported on 11/04/2016 08/06/16   Lindell Spar I, NP  carbamazepine (TEGRETOL) 100 MG chewable tablet Chew 4 tablets (400 mg total) by mouth 2 (two) times daily. For mood stabilization Patient not taking: Reported on 11/04/2016 08/06/16   Lindell Spar I, NP  cholecalciferol (VITAMIN D) 1000 units tablet Take 1 tablet (1,000 Units total) by mouth daily. For bone health 08/06/16    Lindell Spar I, NP  hydrOXYzine (ATARAX/VISTARIL) 25 MG tablet Take 1 tablet (25 mg total) by mouth 2 (two) times daily. For anxiety Patient not taking: Reported on 11/04/2016 08/06/16   Lindell Spar I, NP  lisinopril (PRINIVIL,ZESTRIL) 2.5 MG tablet Take 1 tablet (2.5 mg total) by mouth daily. For high blood pressure Patient not taking: Reported on 11/04/2016 08/07/16   Lindell Spar I, NP  OLANZapine (ZYPREXA) 15 MG tablet Take 1 tablet (15 mg total) by mouth at bedtime. For mood control Patient not taking: Reported on 11/04/2016 08/06/16   Lindell Spar I, NP  OLANZapine (ZYPREXA) 20 MG tablet Take 20 mg by mouth at bedtime. 10/30/16   [provider]  OLANZapine zydis (ZYPREXA) 5 MG disintegrating tablet Take 1 tablet (5 mg total) by mouth daily. For mood control Patient not taking: Reported on 11/04/2016 08/07/16   Lindell Spar I, NP  traZODone (DESYREL) 50 MG tablet Take 1 tablet (50 mg total) by mouth at bedtime. For sleep Patient not taking: Reported on 11/04/2016 08/06/16   Encarnacion Slates, NP    Family History Family History  Problem Relation Age of Onset  . Hypertension Mother   . Diabetes Mother   . Heart failure Father   . Cancer Maternal Aunt        breast, thyroid, colonm  . Heart attack Sister     Social History Social History   Tobacco Use  . Smoking status: Never Smoker  . Smokeless tobacco: Never Used  Substance Use Topics  . Alcohol use: Yes    Alcohol/week: 1.7 oz    Types: 2 Glasses of wine, 1 Standard drinks or equivalent per week  . Drug use: No    Comment: in college     Allergies   Patient has no known allergies.   Review of Systems Review of Systems  Unable to perform ROS: Psychiatric disorder     Physical Exam Updated Vital Signs BP (!) 170/101 (BP Location: Left Arm)   Pulse (!) 59   Temp 98.9 F (37.2 C) (Oral)   Resp 16   SpO2 99%   Physical Exam  Constitutional: She is oriented to person, place, and time. She appears  well-developed and well-nourished. No distress.  HENT:  Head: Normocephalic and atraumatic.  Eyes: Conjunctivae and EOM are normal.  Pulmonary/Chest: Effort normal. No respiratory distress.  Abdominal: She exhibits no distension.  Musculoskeletal: She exhibits no deformity.  Neurological: She is alert and oriented to person, place, and time. No cranial nerve deficit.  Skin: Skin is warm and dry.  Psychiatric: Her affect is blunt. Her speech is delayed. She is slowed. Cognition and memory are impaired. She expresses no suicidal ideation.  Nursing note and vitals reviewed.    ED Treatments / Results  Labs (all labs ordered are listed, but only abnormal results are displayed) Labs Reviewed  BASIC METABOLIC PANEL  CBC WITH DIFFERENTIAL/PLATELET  URINALYSIS, ROUTINE W REFLEX MICROSCOPIC  RAPID URINE  DRUG SCREEN, HOSP PERFORMED     Procedures Procedures (including critical care time)  Medications Ordered in ED Medications  acetaminophen (TYLENOL) tablet 650 mg (not administered)  ALPRAZolam (XANAX) tablet 0.5 mg (not administered)  ARIPiprazole (ABILIFY) tablet 10 mg (not administered)  OLANZapine (ZYPREXA) tablet 20 mg (not administered)     Initial Impression / Assessment and Plan / ED Course  I have reviewed the triage vital signs and the nursing notes.  Pertinent labs & imaging results that were available during my care of the patient were reviewed by me and considered in my medical decision making (see chart for details).  Patient with bipolar disorder presents due to concern, on the part of family of psychosis. The patient is withdrawn, blunted, in no physical distress, but given concern for worsening psychosis she was medically cleared for psychiatric evaluation, and is awaiting inpatient bed placement.   Final Clinical Impressions(s) / ED Diagnoses  Psychosis   Carmin Muskrat, MD 01/05/17 1341

## 2017-01-05 NOTE — ED Notes (Signed)
Dr. Vanita Panda contacted about patient's blood pressure.  Patient reported she was newly diagnosed with hypertension, but was not given any medication for it.

## 2017-01-05 NOTE — ED Triage Notes (Addendum)
Per EMS, patient coming from [facility], sent patient to be medically cleared. [place] reports potential new onset of psychosis. Cooperative with EMS.

## 2017-01-05 NOTE — ED Notes (Signed)
Pt has finished a sandwich she had requested after taking her HS medications.  She is sitting in her room on the chair watching TV and understands to express any needs at anytime to staff as we come around for rounding.

## 2017-01-05 NOTE — ED Notes (Signed)
Pt admitted to room #40. Pt pleasant on approach, guarded at times. Thought blocking noted. "I'm bipolar and my husband thought I was going manic."  Pt denies SI/HI/AVH. Pt reports she is compliant with medication regimen at home. Pt does not identify any current stressors. Encouragement and support provided. Special checks q 15 mins in place for safety, Video monitoring in place. Will continue to monitor.

## 2017-01-05 NOTE — ED Notes (Signed)
Pt noted in room talking to self.

## 2017-01-05 NOTE — ED Notes (Signed)
Dr. Vanita Panda said patient was fine to go to [facility] with her current BP.

## 2017-01-05 NOTE — H&P (Signed)
Behavioral Health Medical Screening Exam  Vanessa Mills is an 56 y.o. female who arrived voluntarily to [facility] accompanied by her husband with his concerns of psychotic breakdown. He reports patient not eating and sleeping well and not taking her medications. Patient appears delusional and in a panic mode with fixated gaze. Her blood pressure is elevated. Patient denies any SI/HI/VAH.  Total Time spent with patient: 30 minutes  Psychiatric Specialty Exam: Physical Exam  Nursing note and vitals reviewed. Constitutional: She is oriented to person, place, and time. She appears well-developed and well-nourished.  HENT:  Head: Normocephalic and atraumatic.  Eyes: Pupils are equal, round, and reactive to light.  Neck: Normal range of motion.  Cardiovascular: Normal rate, regular rhythm and normal heart sounds. Exam reveals no friction rub.  No murmur heard. Respiratory: Effort normal and breath sounds normal.  GI: Soft. Bowel sounds are normal.  Musculoskeletal: Normal range of motion.  Neurological: She is alert and oriented to person, place, and time.  Skin: Skin is warm and dry.    Review of Systems  Psychiatric/Behavioral: Positive for hallucinations. Negative for depression, memory loss, substance abuse and suicidal ideas. The patient is nervous/anxious and has insomnia.   All other systems reviewed and are negative.   Blood pressure (!) 167/106, pulse 70, temperature 99 F (37.2 C), temperature source Oral, resp. rate 16, SpO2 99 %.There is no height or weight on file to calculate BMI.  General Appearance: Bizarre  Eye Contact:  Good  Speech:  Clear and Coherent and Pressured  Volume:  Normal  Mood:  Anxious and Euphoric  Affect:  Blunt and Congruent  Thought Process:  Coherent and Goal Directed  Orientation:  Full (Time, Place, and Person)  Thought Content:  Delusions  Suicidal Thoughts:  No  Homicidal Thoughts:  No  Memory:  Immediate;   Good Recent;   Fair Remote;    Fair  Judgement:  Fair  Insight:  Fair  Psychomotor Activity:  Increased  Concentration: Concentration: Fair and Attention Span: Fair  Recall:  Good  Fund of Knowledge:Good  Language: Good  Akathisia:  Negative  Handed:  Right  AIMS (if indicated):     Assets:  Communication Skills Desire for Improvement Financial Resources/Insurance Housing Intimacy Physical Health Resilience Social Support  Sleep:       Musculoskeletal: Strength & Muscle Tone: within normal limits Gait & Station: normal Patient leans: N/A  Blood pressure (!) 167/106, pulse 70, temperature 99 F (37.2 C), temperature source Oral, resp. rate 16, SpO2 99 %.  Recommendations:  Based on my evaluation the patient appears to have an emergency medical condition for which I recommend the patient be transferred to the emergency department for further evaluation.  Vicenta Aly, NP 01/05/2017, 12:01 PM

## 2017-01-05 NOTE — ED Notes (Signed)
Patient given a cup for urine collection.

## 2017-01-05 NOTE — ED Notes (Signed)
Bed: [facility] Expected date:  Expected time:  Means of arrival:  Comments: 78
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
## 2017-01-06 DIAGNOSIS — R443 Hallucinations, unspecified: Secondary | ICD-10-CM | POA: Diagnosis not present

## 2017-01-06 DIAGNOSIS — F22 Delusional disorders: Secondary | ICD-10-CM

## 2017-01-06 DIAGNOSIS — F3110 Bipolar disorder, current episode manic without psychotic features, unspecified: Secondary | ICD-10-CM

## 2017-01-06 DIAGNOSIS — Z560 Unemployment, unspecified: Secondary | ICD-10-CM

## 2017-01-06 MED ORDER — LISINOPRIL 2.5 MG PO TABS
2.5000 mg | ORAL_TABLET | Freq: Every day | ORAL | Status: DC
  Administered 2017-01-06 – 2017-01-07 (×2): 2.5 mg via ORAL
  Filled 2017-01-06 (×2): qty 1

## 2017-01-06 NOTE — Consult Note (Addendum)
[facility] Face-to-Face Psychiatry Consult   Reason for Consult:  Mania Referring Physician:  EDP Patient Identification: Vanessa Mills MRN:  [id number] Principal Diagnosis: Bipolar disorder, most recent episode manic [facility]) Diagnosis:   Patient Active Problem List   Diagnosis Date Noted  . Bipolar disorder, curr episode mixed, severe, with psychotic features ([place]) [F31.64] 07/31/2016  . Hot flash due to medication [R23.2, T50.905A] 09/20/2014  . History of long-term use of multiple prescription drugs [Z92.29] 05/20/2014  . Malignant neoplasm of overlapping sites of left breast in female, estrogen receptor positive ([place]) [L49.179, Z17.0] 03/18/2014  . DOE (dyspnea on exertion) [R06.09] 09/14/2013  . Abnormal EKG [R94.31] 09/14/2013  . Essential tremor [G25.0] 06/05/2012  . Abnormal involuntary movements(781.0) [R25.9] 06/05/2011  . 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 Bipolar disorder ([place]) [F31.9] 01/30/2011  . Chemotherapy follow-up examination [Z09] 12/28/2010  . Fatigue [R53.83] 12/28/2010    Total Time spent with patient: 45 minutes  Subjective:   Vanessa Mills is a 56 y.o. female patient admitted with mania.  HPI:  Per chart review, Vanessa Mills was admitted due to husband's concern for manic symptoms. She was praying over her husband prior to admission. On interview, she reports that her husband thinks that she is manic because she has exhibited increased goal directed behaviors (completing tasks at home) and reading the bible. She reports that she has been Nutritional therapist for Christmas. She hears the voice of Jesus speaking to her. She denies problems with sleep. She denies substance use. She reports compliance with her home medications. According to nursing, she has been disorganized and responding to internal stimuli. She was lying in bed naked yesterday and had her pants down while lying in bed this morning.   Past Psychiatric History: Bipolar disorder.  Risk  to Self: Is patient at risk for suicide?: No Risk to Others:  None Prior Inpatient Therapy:  She has been admitted multiple times for bipolar disorder. Prior Outpatient Therapy:  She is followed by Dr. Toy Care.   Past Medical History:  Past Medical History:  Diagnosis Date  . Bipolar 1 disorder ([place])   . Breast cancer ([place]) 03/07/2011  . Breast cancer, ILC, Left, receptor+, Her2- 10/10/2010  . Chemotherapy follow-up examination 12/28/2010  . Chronic mental illness   . Crohn's disease ([place])   . Fatigue 12/28/2010  . GERD (gastroesophageal reflux disease)    does not take medications for   . Headache(784.0)    takes midodrine for migraines prn  . Meniere's syndrome   . Mental disorder    bipolar, takes saphris at hs  . Movement disorder   . PONV (postoperative nausea and vomiting)   . Raynaud's disease   . S/P radiation therapy 05/15/11 - 07/01/11   Left Breast: 4500 cGy/25 fractions with Boost to Left chest Wall/Mastectomy Scar for Toal dose of 5940 cGy  . Status post chemotherapy    4 cycles AC  . Status post chemotherapy 01/31/11 - 04/18/11   Taxol x 12 weeks    Past Surgical History:  Procedure Laterality Date  . 2 orbital fracture surgeries    . ABDOMINAL HYSTERECTOMY     uterus removed only  . BLADDER SUSPENSION     done 2005  . BREAST SURGERY     Axillary dissection  . MASTECTOMY MODIFIED RADICAL  10/23/2010   Left Dr Margot Chimes  . PORT-A-CATH REMOVAL  08/21/2011   Procedure: REMOVAL PORT-A-CATH;  Surgeon: Haywood Lasso, MD;  Location: [place];  Service: General;  Laterality: Right;  . PORTACATH PLACEMENT  11/28/2010   via right subclavian - Dr Margot Chimes  . SINUS EXPLORATION     Family History:  Family History  Problem Relation Age of Onset  . Hypertension Mother   . Diabetes Mother   . Heart failure Father   . Cancer Maternal Aunt        breast, thyroid, colonm  . Heart attack Sister    Family Psychiatric  History: Unknown  Social History:  Social  History   Substance and Sexual Activity  Alcohol Use Yes  . Alcohol/week: 1.7 oz  . Types: 2 Glasses of wine, 1 Standard drinks or equivalent per week     Social History   Substance and Sexual Activity  Drug Use No   Comment: in college    Social History   Socioeconomic History  . Marital status: Married    Spouse name: Linna Hoff   . Number of children: 1  . Years of education: Masters  . Highest education level: None  Social Needs  . Financial resource strain: None  . Food insecurity - worry: None  . Food insecurity - inability: None  . Transportation needs - medical: None  . Transportation needs - non-medical: None  Occupational History  . Occupation: unemployed  Tobacco Use  . Smoking status: Never Smoker  . Smokeless tobacco: Never Used  Substance and Sexual Activity  . Alcohol use: Yes    Alcohol/week: 1.7 oz    Types: 2 Glasses of wine, 1 Standard drinks or equivalent per week  . Drug use: No    Comment: in college  . Sexual activity: Yes    Comment: erpr+/HER-2 neg.  Other Topics Concern  . None  Social History Narrative   Married to [place]   One dtr- 58 years old- [name]- in [place]   Patient has a Masters   Patient  has 1 child.    Additional Social History: She lives at home with her husband. She denies substance use.     Allergies:  No Known Allergies  Labs:  Results for orders placed or performed during the hospital encounter of 01/05/17 (from the past 48 hour(s))  Basic metabolic panel     Status: Abnormal   Collection Time: 01/05/17  1:30 PM  Result Value Ref Range   Sodium 141 135 - 145 mmol/L   Potassium 4.6 3.5 - 5.1 mmol/L   Chloride 105 101 - 111 mmol/L   CO2 28 22 - 32 mmol/L   Glucose, Bld 135 (H) 65 - 99 mg/dL   BUN 11 6 - 20 mg/dL   Creatinine, Ser 0.62 0.44 - 1.00 mg/dL   Calcium 9.3 8.9 - 10.3 mg/dL   GFR calc non Af Amer >60 >60 mL/min   GFR calc Af Amer >60 >60 mL/min    Comment: (NOTE) The eGFR has been calculated using  the CKD EPI equation. This calculation has not been validated in all clinical situations. eGFR's persistently <60 mL/min signify possible Chronic Kidney Disease.    Anion gap 8 5 - 15  CBC with Differential     Status: None   Collection Time: 01/05/17  1:30 PM  Result Value Ref Range   WBC 7.1 4.0 - 10.5 K/uL   RBC 4.38 3.87 - 5.11 MIL/uL   Hemoglobin 13.7 12.0 - 15.0 g/dL   HCT 42.1 36.0 - 46.0 %   MCV 96.1 78.0 - 100.0 fL   MCH 31.3 26.0 - 34.0 pg   MCHC  32.5 30.0 - 36.0 g/dL   RDW 12.6 11.5 - 15.5 %   Platelets 302 150 - 400 K/uL   Neutrophils Relative % 76 %   Neutro Abs 5.4 1.7 - 7.7 K/uL   Lymphocytes Relative 17 %   Lymphs Abs 1.2 0.7 - 4.0 K/uL   Monocytes Relative 7 %   Monocytes Absolute 0.5 0.1 - 1.0 K/uL   Eosinophils Relative 0 %   Eosinophils Absolute 0.0 0.0 - 0.7 K/uL   Basophils Relative 0 %   Basophils Absolute 0.0 0.0 - 0.1 K/uL  Urinalysis, Routine w reflex microscopic     Status: Abnormal   Collection Time: 01/05/17  1:30 PM  Result Value Ref Range   Color, Urine YELLOW YELLOW   APPearance CLEAR CLEAR   Specific Gravity, Urine 1.010 1.005 - 1.030   pH 7.0 5.0 - 8.0   Glucose, UA NEGATIVE NEGATIVE mg/dL   Hgb urine dipstick SMALL (A) NEGATIVE   Bilirubin Urine NEGATIVE NEGATIVE   Ketones, ur NEGATIVE NEGATIVE mg/dL   Protein, ur NEGATIVE NEGATIVE mg/dL   Nitrite NEGATIVE NEGATIVE   Leukocytes, UA NEGATIVE NEGATIVE   RBC / HPF 0-5 0 - 5 RBC/hpf   WBC, UA 0-5 0 - 5 WBC/hpf   Bacteria, UA NONE SEEN NONE SEEN   Squamous Epithelial / LPF 0-5 (A) NONE SEEN   Mucus PRESENT   Urine rapid drug screen (hosp performed)     Status: None   Collection Time: 01/05/17  1:30 PM  Result Value Ref Range   Opiates NONE DETECTED NONE DETECTED   Cocaine NONE DETECTED NONE DETECTED   Benzodiazepines NONE DETECTED NONE DETECTED   Amphetamines NONE DETECTED NONE DETECTED   Tetrahydrocannabinol NONE DETECTED NONE DETECTED   Barbiturates NONE DETECTED NONE DETECTED     Comment:        DRUG SCREEN FOR MEDICAL PURPOSES ONLY.  IF CONFIRMATION IS NEEDED FOR ANY PURPOSE, NOTIFY LAB WITHIN 5 DAYS.        LOWEST DETECTABLE LIMITS FOR URINE DRUG SCREEN Drug Class       Cutoff (ng/mL) Amphetamine      1000 Barbiturate      200 Benzodiazepine   379 Tricyclics       024 Opiates          300 Cocaine          300 THC              50     Current Facility-Administered Medications  Medication Dose Route Frequency Provider Last Rate Last Dose  . acetaminophen (TYLENOL) tablet 650 mg  650 mg Oral Q4H PRN Carmin Muskrat, MD      . ALPRAZolam Duanne Moron) tablet 0.5 mg  0.5 mg Oral QHS Carmin Muskrat, MD   0.5 mg at 01/05/17 2204  . ARIPiprazole (ABILIFY) tablet 10 mg  10 mg Oral Daily Carmin Muskrat, MD   10 mg at 01/06/17 1030  . OLANZapine (ZYPREXA) tablet 20 mg  20 mg Oral QHS Carmin Muskrat, MD   20 mg at 01/05/17 2204   Current Outpatient Medications  Medication Sig Dispense Refill  . cholecalciferol (VITAMIN D) 1000 units tablet Take 1 tablet (1,000 Units total) by mouth daily. For bone health 1 tablet 0  . ARIPiprazole (ABILIFY) 10 MG tablet Take 1 tablet (10 mg total) by mouth daily. (Patient not taking: Reported on 01/05/2017) 14 tablet 0    Musculoskeletal: Strength & Muscle Tone: within normal limits Gait & Station: UTA since  patient was lying down. Patient leans: N/A  Psychiatric Specialty Exam: Physical Exam  Constitutional: She appears well-developed and well-nourished.  HENT:  Head: Normocephalic and atraumatic.  Neck: Normal range of motion.  Respiratory: Effort normal.  Musculoskeletal: Normal range of motion.  Neurological: She is alert.  Skin: No rash noted.  Psychiatric: She has a normal mood and affect. Her speech is normal and behavior is normal. Thought content is delusional. Cognition and memory are normal. She expresses no homicidal and no suicidal ideation.    Review of Systems  Psychiatric/Behavioral: Positive for  hallucinations. Negative for depression, substance abuse and suicidal ideas. The patient is not nervous/anxious and does not have insomnia.     Blood pressure (!) 154/95, pulse 70, temperature 99.6 F (37.6 C), temperature source Oral, resp. rate 17, SpO2 99 %.There is no height or weight on file to calculate BMI.  General Appearance: Well Groomed, middle aged, Caucasian female who is wearing paper scrubs and sitting in bed. NAD.   Eye Contact:  Good  Speech:  Normal Rate  Volume:  Normal  Mood:  Euthymic  Affect:  Full Range  Thought Process:  Goal Directed and Linear  Orientation:  Full (Time, Place, and Person)  Thought Content:  Delusions and Hallucinations: Auditory  Suicidal Thoughts:  No  Homicidal Thoughts:  No  Memory:  Immediate;   Fair Recent;   Fair Remote;   Fair  Judgement:  Impaired  Insight:  Lacking  Psychomotor Activity:  Normal  Concentration:  Concentration: Fair and Attention Span: Fair  Recall:  [employer] of Knowledge:  Fair  Language:  Fair  Akathisia:  No  Handed:  Right  AIMS (if indicated):   N/A  Assets:  Communication Skills Housing Social Support  ADL's:  Intact  Cognition:  Impaired due to illness.  Sleep:   Okay   Assessment:  Vanessa Mills is a 56 y.o. female who was admitted with manic symptoms. She has been responding to internal stimuli and exhibiting bizarre behavior while on the unit. She endorses AH of hearing God. She warrants inpatient psychiatric hospitalization for stabilization.   Treatment Plan Summary: Daily contact with patient to assess and evaluate symptoms and progress in treatment and Medication management  -Continue home medications: Xanax 0.5 qhs, Abilify 10 mg daily and Zyprexa 20 mg qhs.   Disposition: Recommend psychiatric Inpatient admission when medically cleared.  Faythe Dingwall, DO 01/06/2017 12:11 PM

## 2017-01-06 NOTE — ED Notes (Signed)
This nurse called [facility] transport to transfer pt to [facility] per MD order. [facility] unable to transport till tomorrow d/t weather conditions. [place] notified.

## 2017-01-06 NOTE — ED Notes (Signed)
Pt compliant with medication regimen. Pt appears to be responding to internal stimuli. Pt observed in room talking to self through out the shift. Pt denies SI/HI. Pt religiously pre occupied. Observed pacing the halls at times. Encouragement and support provided. Special checks q 15 mins in place for safety, Video monitoring place. Will continue to monitor.

## 2017-01-06 NOTE — BHH Counselor (Signed)
Per Delila Pereyra at [facility], pt has been accepted by Dr Mariea Clonts to Dr Nancy Fetter, room 501-1. Call report to [phone]. Support paperwork signed and faxed to [facility]. [facility] placed in pt's chart. Per AC, pt's BP is high and needs to be lowered prior to admission to [facility]. Pt can be transported whenever [facility] is available to bring pt over. Pt's RN aware.    Arnold Long, [place] Therapeutic Triage Specialist

## 2017-01-06 NOTE — BHH Counselor (Signed)
Writer faxed referral to following facilities that have available beds:  [place], [place] Therapeutic Triage Specialist

## 2017-01-06 NOTE — Progress Notes (Signed)
Patient accepted to [facility], room 501-1.  Accepted to the service of Dr. Nancy Fetter, MD. Please call report to [phone]. Clayborne Dana, RN.

## 2017-01-06 NOTE — ED Notes (Signed)
Patient seen in her room resting. After taking her V/S and was notified of her high Bp,  Patient got up and started pacing the hallway stated "It helps my BP and helps me to calm down". Will recheck her BP Before bedtime meanwhile patient has scheduled Xanax for anxiety at 10 pm. Will continue to monitor patient.

## 2017-01-06 NOTE — ED Provider Notes (Signed)
Notified that pt is feeling dizzy and her blood pressure is elevated.  Pt is supposed to be on lisinopril based on her home med regimen.  Ordered now.     Dorie Rank, MD 01/06/17 [phone]

## 2017-01-06 NOTE — ED Notes (Signed)
Pt c/o feeling dizzy. VS obtained. This nurse notified EDP.

## 2017-01-06 NOTE — ED Notes (Signed)
Patient's blood pressure repeated 158/110. Dr. On call notified. Reviewing patient at this time.

## 2017-01-07 ENCOUNTER — Encounter (HOSPITAL_COMMUNITY): Payer: Self-pay | Admitting: *Deleted

## 2017-01-07 ENCOUNTER — Inpatient Hospital Stay (HOSPITAL_COMMUNITY)
Admission: AD | Admit: 2017-01-07 | Discharge: 2017-01-14 | DRG: 885 | Disposition: A | Discharge status: Home / Self Care | Payer: Medicare Other | Source: Intra-hospital | Attending: Psychiatry | Admitting: Psychiatry

## 2017-01-07 ENCOUNTER — Other Ambulatory Visit: Payer: Self-pay

## 2017-01-07 DIAGNOSIS — G4700 Insomnia, unspecified: Secondary | ICD-10-CM | POA: Diagnosis present

## 2017-01-07 DIAGNOSIS — Z170 Estrogen receptor positive status [ER+]: Secondary | ICD-10-CM | POA: Diagnosis not present

## 2017-01-07 DIAGNOSIS — F250 Schizoaffective disorder, bipolar type: Secondary | ICD-10-CM | POA: Diagnosis present

## 2017-01-07 DIAGNOSIS — R443 Hallucinations, unspecified: Secondary | ICD-10-CM | POA: Diagnosis not present

## 2017-01-07 DIAGNOSIS — Z9221 Personal history of antineoplastic chemotherapy: Secondary | ICD-10-CM | POA: Diagnosis not present

## 2017-01-07 DIAGNOSIS — Z853 Personal history of malignant neoplasm of breast: Secondary | ICD-10-CM | POA: Diagnosis not present

## 2017-01-07 DIAGNOSIS — Z9119 Patient's noncompliance with other medical treatment and regimen: Secondary | ICD-10-CM

## 2017-01-07 DIAGNOSIS — R440 Auditory hallucinations: Secondary | ICD-10-CM | POA: Diagnosis not present

## 2017-01-07 DIAGNOSIS — F3164 Bipolar disorder, current episode mixed, severe, with psychotic features: Secondary | ICD-10-CM | POA: Diagnosis present

## 2017-01-07 DIAGNOSIS — K5090 Crohn's disease, unspecified, without complications: Secondary | ICD-10-CM | POA: Diagnosis present

## 2017-01-07 DIAGNOSIS — F419 Anxiety disorder, unspecified: Secondary | ICD-10-CM | POA: Diagnosis present

## 2017-01-07 DIAGNOSIS — Z923 Personal history of irradiation: Secondary | ICD-10-CM | POA: Diagnosis not present

## 2017-01-07 DIAGNOSIS — I10 Essential (primary) hypertension: Secondary | ICD-10-CM | POA: Diagnosis present

## 2017-01-07 DIAGNOSIS — K219 Gastro-esophageal reflux disease without esophagitis: Secondary | ICD-10-CM | POA: Diagnosis present

## 2017-01-07 DIAGNOSIS — R450 Nervousness: Secondary | ICD-10-CM | POA: Diagnosis not present

## 2017-01-07 DIAGNOSIS — I7300 Raynaud's syndrome without gangrene: Secondary | ICD-10-CM | POA: Diagnosis present

## 2017-01-07 DIAGNOSIS — Z560 Unemployment, unspecified: Secondary | ICD-10-CM | POA: Diagnosis not present

## 2017-01-07 DIAGNOSIS — F1010 Alcohol abuse, uncomplicated: Secondary | ICD-10-CM | POA: Diagnosis not present

## 2017-01-07 DIAGNOSIS — Z818 Family history of other mental and behavioral disorders: Secondary | ICD-10-CM | POA: Diagnosis not present

## 2017-01-07 MED ORDER — HYDROXYZINE HCL 25 MG PO TABS
25.0000 mg | ORAL_TABLET | Freq: Three times a day (TID) | ORAL | Status: DC | PRN
  Administered 2017-01-07: 25 mg via ORAL
  Filled 2017-01-07: qty 1

## 2017-01-07 MED ORDER — LORAZEPAM 1 MG PO TABS
1.0000 mg | ORAL_TABLET | ORAL | Status: AC | PRN
  Administered 2017-01-07: 1 mg via ORAL
  Filled 2017-01-07: qty 1

## 2017-01-07 MED ORDER — TRAZODONE HCL 100 MG PO TABS
100.0000 mg | ORAL_TABLET | Freq: Every evening | ORAL | Status: DC | PRN
  Filled 2017-01-07: qty 1

## 2017-01-07 MED ORDER — OLANZAPINE 10 MG PO TABS
20.0000 mg | ORAL_TABLET | Freq: Every day | ORAL | Status: DC
  Administered 2017-01-07: 20 mg via ORAL
  Filled 2017-01-07 (×4): qty 2

## 2017-01-07 MED ORDER — ALUM & MAG HYDROXIDE-SIMETH 200-200-20 MG/5ML PO SUSP: 30.0000 mL | ORAL | Status: DC | PRN

## 2017-01-07 MED ORDER — ALPRAZOLAM 0.5 MG PO TABS
0.5000 mg | ORAL_TABLET | Freq: Every day | ORAL | Status: DC
  Administered 2017-01-07: 0.5 mg via ORAL
  Filled 2017-01-07: qty 1

## 2017-01-07 MED ORDER — ZIPRASIDONE MESYLATE 20 MG IM SOLR: 20.0000 mg | INTRAMUSCULAR | Status: DC | PRN

## 2017-01-07 MED ORDER — RISPERIDONE 1 MG PO TBDP
2.0000 mg | ORAL_TABLET | Freq: Three times a day (TID) | ORAL | Status: DC | PRN
  Administered 2017-01-07: 2 mg via ORAL
  Filled 2017-01-07: qty 2

## 2017-01-07 MED ORDER — ARIPIPRAZOLE 10 MG PO TABS
10.0000 mg | ORAL_TABLET | Freq: Every day | ORAL | Status: DC
  Administered 2017-01-08: 10 mg via ORAL
  Filled 2017-01-07 (×3): qty 1

## 2017-01-07 MED ORDER — LISINOPRIL 2.5 MG PO TABS
2.5000 mg | ORAL_TABLET | Freq: Every day | ORAL | Status: DC
  Administered 2017-01-08 – 2017-01-13 (×6): 2.5 mg via ORAL
  Filled 2017-01-07 (×9): qty 1

## 2017-01-07 MED ORDER — ACETAMINOPHEN 325 MG PO TABS: 650.0000 mg | ORAL_TABLET | Freq: Four times a day (QID) | ORAL | Status: DC | PRN

## 2017-01-07 MED ORDER — MAGNESIUM HYDROXIDE 400 MG/5ML PO SUSP: 30.0000 mL | Freq: Every day | ORAL | Status: DC | PRN

## 2017-01-07 NOTE — ED Notes (Signed)
[facility] called for transport request. States they will dispatch at 1000.  Information given and space reserved.

## 2017-01-07 NOTE — Progress Notes (Signed)
Patient admitted vol after receiving medical clearance. Initial walk in to [facility] on 01/05/17 and was sent to [facility]. Patient now states, "I don't think I need to be here." Indicates goals are to "eat good food and sleep in a comfy bed." Per TTS assessment, patient indicated on intake form that Jesus Christ is her emergency contact. Also made reference to her husband being her best friend "but only here on earth." Hx of religious delusions, bizarre behavior with last IP admission in October, 2018. On present for admit today, patient slightly paranoid but is overall cooperative. Speech slightly pressured. She denies AVH. No SI/HI. Various issues under PMH - see chart. Denies pain, physical problems at present.  Patient's skin and clothing searched, belongings secured. L mastectomy scar noted. Level III obs initiated. Oriented to unit and emotional support provided. Reassured of safety. Fall prevention plan reviewed and in place and patient is a low fall risk.  Patient verbalizes understanding of POC. Remains safe at this time. Report given to [facility], RN who will assume care.

## 2017-01-07 NOTE — Progress Notes (Signed)
Did not attend group 

## 2017-01-07 NOTE — Tx Team (Signed)
Initial Treatment Plan 01/07/2017 4:05 PM Vanessa Mills [id number]    PATIENT STRESSORS: Other: patient denies stressors - per chart, delusional   PATIENT STRENGTHS: Average or above average intelligence Capable of independent living Communication skills General fund of knowledge Physical Health Supportive family/friends   PATIENT IDENTIFIED PROBLEMS:   "To eat some good food."  "To stay in a comfy bed."                 DISCHARGE CRITERIA:  Improved stabilization in mood, thinking, and/or behavior Need for constant or close observation no longer present Verbal commitment to aftercare and medication compliance  PRELIMINARY DISCHARGE PLAN: Outpatient therapy Return to previous living arrangement  PATIENT/FAMILY INVOLVEMENT: This treatment plan has been presented to and reviewed with the patient, Vanessa Mills, and/or family member.  The patient and family have been given the opportunity to ask questions and make suggestions.  Jamie Kato, RN 01/07/2017, 4:05 PM

## 2017-01-07 NOTE — ED Notes (Signed)
Patient is calm and cooperative.  She is alert and oriented x4.  No active A/V/H.  Denies SI/HI. States she has been compliant with scheduled medications.  Feels she was "off track due to "lack of sleep".

## 2017-01-07 NOTE — Progress Notes (Signed)
Recreation Therapy Notes  INPATIENT RECREATION THERAPY ASSESSMENT  Patient Details Name: Parneet Glantz MRN: [id number] DOB: [birth year] Today's Date: 01/07/2017  Patient Stressors: (Pt stated she didn't have any stressors.)  Pt stated she was here because her husband wanted to make sure she got from one doctor to the other.  Coping Skills:   Exercise, Art/Dance, Talking, Music, Sports, Other (Comment)(Pt stated she journals.)  Pt stated she cooks.  Personal Challenges: Communication, Social Interaction  Leisure Interests (2+):  Individual - Reading, Music - Listen, Exercise - Running, Sports - Exercise (Comment), Individual - Other (Comment)(Cook; riding bicycle)  Awareness of Community Resources:  Yes  Community Resources:  Library, Other (Comment)(Stores, banks)  Current Use: Yes  Patient Strengths:  Confidence; Perserverance  Patient Identified Areas of Improvement:  "I don't really need improvement"  Current Recreation Participation:  Daily  Patient Goal for Hospitalization:  "Learn to be a lady, polite and kind to the people I need to be polite and kind to"  [place] of Residence:  [place] of Residence:  [place]  Current SI (including self-harm):  No  Current HI:  No  Consent to Intern Participation: N/A     Victorino Sparrow, LRT/CTRS  Victorino Sparrow A 01/07/2017, 3:44 PM

## 2017-01-07 NOTE — ED Notes (Signed)
Explained transfer to [facility].  Transported by Betsy Pries in NAD.

## 2017-01-07 NOTE — ED Notes (Signed)
Transfer after lunch per charge RN.

## 2017-01-07 NOTE — ED Notes (Signed)
Patient still awake, moving round her room and pacing hallway. Patient at this time seen laying on the floor and trying to get herself under the bed. Patient needs constant redirection to stay on task. Dr. Betsey Holiday notified of patient's condition. New orders in. Will continue to monitor patient.

## 2017-01-07 NOTE — Progress Notes (Signed)
D: Pt has been sleep the duration of the evening.  A: Pt was offered support and encouragement.  Pt was encourage to attend groups. Q 15 minute checks were done for safety.  R: safety maintained on unit.

## 2017-01-08 DIAGNOSIS — F419 Anxiety disorder, unspecified: Secondary | ICD-10-CM

## 2017-01-08 DIAGNOSIS — Z818 Family history of other mental and behavioral disorders: Secondary | ICD-10-CM

## 2017-01-08 DIAGNOSIS — R440 Auditory hallucinations: Secondary | ICD-10-CM

## 2017-01-08 DIAGNOSIS — G4700 Insomnia, unspecified: Secondary | ICD-10-CM

## 2017-01-08 DIAGNOSIS — R450 Nervousness: Secondary | ICD-10-CM

## 2017-01-08 DIAGNOSIS — F3164 Bipolar disorder, current episode mixed, severe, with psychotic features: Principal | ICD-10-CM

## 2017-01-08 MED ORDER — PALIPERIDONE ER 6 MG PO TB24
6.0000 mg | ORAL_TABLET | Freq: Every day | ORAL | Status: DC
  Administered 2017-01-08 – 2017-01-14 (×7): 6 mg via ORAL
  Filled 2017-01-08 (×11): qty 1
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
## 2017-01-08 NOTE — BHH Suicide Risk Assessment (Signed)
[facility] Admission Suicide Risk Assessment   Nursing information obtained from:  Patient, Review of record Demographic factors:  Caucasian Current Mental Status:  (denies all) Loss Factors:  (denies all) Historical Factors:  Family history of mental illness or substance abuse Risk Reduction Factors:  Sense of responsibility to family, Living with another person, especially a relative, Positive social support, Positive therapeutic relationship  Total Time spent with patient: 45 minutes Principal Problem: Bipolar disorder, curr episode mixed, severe, with psychotic features ([place]) Diagnosis:   Patient Active Problem List   Diagnosis Date Noted  . Schizoaffective disorder, bipolar type ([place]) [F25.0] 01/07/2017  . Bipolar disorder, curr episode mixed, severe, with psychotic features ([place]) [F31.64] 07/31/2016  . Hot flash due to medication [R23.2, T50.905A] 09/20/2014  . History of long-term use of multiple prescription drugs [Z92.29] 05/20/2014  . Malignant neoplasm of overlapping sites of left breast in female, estrogen receptor positive ([place]) [W65.681, Z17.0] 03/18/2014  . DOE (dyspnea on exertion) [R06.09] 09/14/2013  . Abnormal EKG [R94.31] 09/14/2013  . Essential tremor [G25.0] 06/05/2012  . Abnormal involuntary movements(781.0) [R25.9] 06/05/2011  . 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 Bipolar disorder, most recent episode manic ([place]) [F31.10] 01/30/2011  . Chemotherapy follow-up examination [Z09] 12/28/2010  . Fatigue [R53.83] 12/28/2010   Subjective Data:  Vanessa Mills is a 56 y/o F with history of Bipolar 1 who was admitted with worsening symptoms of mania and psychosis. Pt had arrived voluntarily to ED with her husband due to concern of worsening disorganized behaviors, mania, responding to internal stimuli, religious preoccupiation, and AH of the voice of Jesus. Pt has recent relevant history of discharge from inpatient unit at [facility] in July 2018 for similar  presentation. Pt had been discharged on olanzapine but she reports that she did not feel it was necessary so she stopped it on her own. Pt had also been trialed on abilify, but she felt that she did not need it so she stopped it on her own. She notes that her only current medication is tegretol 46m qhs. Pt reports her sleep has been good. She denies depressed mood or other symptoms of depression. She denies symptoms of mania aside from some vague grandiosity that she has had "an increase in my faith." She denies symptoms of PTSD. She reports drinking about 3 glasses of wine about 3 times per week and she denies all other illicit substance use.  Discussed with patient about treatment options. She notes previous trials of risperdal and saphris resulted in feeling over-sedated. We discussed option of trial of medication with potential for long-acting injectable form and pt was in agreement to attempt trial of oral invega with tentative plan to transition to IMauritiusif she tolerates the oral form. Pt agrees to discontinue carbamazepine at this time as she would like to focus on one medication. She had no further questions, comments, or concerns.   Continued Clinical Symptoms:  Alcohol Use Disorder Identification Test Final Score (AUDIT): 13 The "Alcohol Use Disorders Identification Test", Guidelines for Use in Primary Care, Second Edition.  World HPharmacologist([facility]. Score between 0-7:  no or low risk or alcohol related problems. Score between 8-15:  moderate risk of alcohol related problems. Score between 16-19:  high risk of alcohol related problems. Score 20 or above:  warrants further diagnostic evaluation for alcohol dependence and treatment.   CLINICAL FACTORS:   Severe Anxiety and/or Agitation Bipolar Disorder:   Mixed State Currently Psychotic Previous Psychiatric Diagnoses and Treatments   Musculoskeletal:  Strength & Muscle Tone: within normal limits Gait & Station:  normal Patient leans: N/A  Psychiatric Specialty Exam: Physical Exam  Nursing note and vitals reviewed.   Review of Systems  Constitutional: Negative for chills and fever.  HENT: Negative for hearing loss.   Respiratory: Negative for cough.   Cardiovascular: Negative for chest pain.  Psychiatric/Behavioral: Positive for hallucinations. Negative for depression and suicidal ideas.    Blood pressure (!) 153/101, pulse 77, temperature 98.7 F (37.1 C), resp. rate 16, height 5' 7.75" (1.721 m), weight 55.8 kg (123 lb).Body mass index is 18.84 kg/m.  General Appearance: Fairly Groomed  Eye Contact:  Good  Speech:  Clear and Coherent and Normal Rate  Volume:  Normal  Mood:  Anxious  Affect:  Appropriate, Congruent and Constricted  Thought Process:  Coherent, Goal Directed and Descriptions of Associations: Loose  Orientation:  Full (Time, Place, and Person)  Thought Content:  Delusions and Hallucinations: Auditory  Suicidal Thoughts:  No  Homicidal Thoughts:  No  Memory:  Immediate;   Good Recent;   Good Remote;   Good  Judgement:  Impaired  Insight:  Lacking  Psychomotor Activity:  Normal  Concentration:  Concentration: Good  Recall:  [place] of Knowledge:  Fair  Language:  Fair  Akathisia:  No  Handed:    AIMS (if indicated):     Assets:  Psychiatrist Social Support  ADL's:  Intact  Cognition:  WNL  Sleep:  Number of Hours: 3      COGNITIVE FEATURES THAT CONTRIBUTE TO RISK:  None    SUICIDE RISK:   Minimal: No identifiable suicidal ideation.  Patients presenting with no risk factors but with morbid ruminations; may be classified as minimal risk based on the severity of the depressive symptoms  PLAN OF CARE:   - Admit to the inpatient psychiatry unit  - Bipolar I with psychotic features   - Start Invega 55m po qDay  - DC tegretol, olanzapine, abilify  -Encourage participation in groups and therapeutic milieu -Discharge planning will be  ongoing  I certify that inpatient services furnished can reasonably be expected to improve the patient's condition.   CPennelope Bracken MD 01/08/2017, 5:38 PM
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
## 2017-01-08 NOTE — Tx Team (Signed)
Interdisciplinary Treatment and Diagnostic Plan Update  01/08/2017 Time of Session: 4:56 PM  Vanessa Mills MRN: [id number]  Principal Diagnosis: Bipolar disorder, curr episode mixed, severe, with psychotic features ([place])  Secondary Diagnoses: Principal Problem:   Bipolar disorder, curr episode mixed, severe, with psychotic features ([place]) Active Problems:   Schizoaffective disorder, bipolar type ([place])   Current Medications:  Current Facility-Administered Medications  Medication Dose Route Frequency Provider Last Rate Last Dose  . acetaminophen (TYLENOL) tablet 650 mg  650 mg Oral Q6H PRN Ethelene Hal, NP      . ALPRAZolam Duanne Moron) tablet 0.5 mg  0.5 mg Oral QHS Ethelene Hal, NP   0.5 mg at 01/07/17 2335  . alum & mag hydroxide-simeth (MAALOX/MYLANTA) 200-200-20 MG/5ML suspension 30 mL  30 mL Oral Q4H PRN Ethelene Hal, NP      . ARIPiprazole (ABILIFY) tablet 10 mg  10 mg Oral Daily Ethelene Hal, NP   10 mg at 01/08/17 8756  . hydrOXYzine (ATARAX/VISTARIL) tablet 25 mg  25 mg Oral TID PRN Ethelene Hal, NP   25 mg at 01/07/17 2335  . lisinopril (PRINIVIL,ZESTRIL) tablet 2.5 mg  2.5 mg Oral Daily Ethelene Hal, NP   2.5 mg at 01/08/17 4332  . magnesium hydroxide (MILK OF MAGNESIA) suspension 30 mL  30 mL Oral Daily PRN Ethelene Hal, NP      . OLANZapine [facility]) tablet 20 mg  20 mg Oral QHS Ethelene Hal, NP   20 mg at 01/07/17 2334  . traZODone (DESYREL) tablet 100 mg  100 mg Oral QHS PRN Ethelene Hal, NP        PTA Medications: Medications Prior to Admission  Medication Sig Dispense Refill Last Dose  . ARIPiprazole (ABILIFY) 10 MG tablet Take 1 tablet (10 mg total) by mouth daily. (Patient not taking: Reported on 01/05/2017) 14 tablet 0 Not Taking at Unknown time  . cholecalciferol (VITAMIN D) 1000 units tablet Take 1 tablet (1,000 Units total) by mouth daily. For bone health 1 tablet 0 Past Week at  Unknown time    Patient Stressors: Other: patient denies stressors - per chart, delusional  Patient Strengths: Average or above average intelligence Capable of independent living Communication skills General fund of knowledge Physical Health Supportive family/friends  Treatment Modalities: Medication Management, Group therapy, Case management,  1 to 1 session with clinician, Psychoeducation, Recreational therapy.   Physician Treatment Plan for Primary Diagnosis: Bipolar disorder, curr episode mixed, severe, with psychotic features ([place]) Long Term Goal(s): Improvement in symptoms so as ready for discharge  Short Term Goals: Ability to identify changes in lifestyle to reduce recurrence of condition will improve Ability to demonstrate self-control will improve Ability to identify and develop effective coping behaviors will improve Compliance with prescribed medications will improve Ability to identify triggers associated with substance abuse/mental health issues will improve  Medication Management: Evaluate patient's response, side effects, and tolerance of medication regimen.  Therapeutic Interventions: 1 to 1 sessions, Unit Group sessions and Medication administration.  Evaluation of Outcomes: Progressing  Physician Treatment Plan for Secondary Diagnosis: Principal Problem:   Bipolar disorder, curr episode mixed, severe, with psychotic features ([place]) Active Problems:   Schizoaffective disorder, bipolar type ([place])   Long Term Goal(s): Improvement in symptoms so as ready for discharge  Short Term Goals: Ability to identify changes in lifestyle to reduce recurrence of condition will improve Ability to demonstrate self-control will improve Ability to identify and develop effective coping behaviors will improve  Compliance with prescribed medications will improve Ability to identify triggers associated with substance abuse/mental health issues will improve  Medication  Management: Evaluate patient's response, side effects, and tolerance of medication regimen.  Therapeutic Interventions: 1 to 1 sessions, Unit Group sessions and Medication administration.  Evaluation of Outcomes: Progressing   RN Treatment Plan for Primary Diagnosis: Bipolar disorder, curr episode mixed, severe, with psychotic features ([place]) Long Term Goal(s): Knowledge of disease and therapeutic regimen to maintain health will improve  Short Term Goals: Ability to identify and develop effective coping behaviors will improve and Compliance with prescribed medications will improve  Medication Management: RN will administer medications as ordered by provider, will assess and evaluate patient's response and provide education to patient for prescribed medication. RN will report any adverse and/or side effects to prescribing provider.  Therapeutic Interventions: 1 on 1 counseling sessions, Psychoeducation, Medication administration, Evaluate responses to treatment, Monitor vital signs and CBGs as ordered, Perform/monitor CIWA, COWS, AIMS and Fall Risk screenings as ordered, Perform wound care treatments as ordered.  Evaluation of Outcomes: Progressing   LCSW Treatment Plan for Primary Diagnosis: Bipolar disorder, curr episode mixed, severe, with psychotic features ([place]) Long Term Goal(s): Safe transition to appropriate next level of care at discharge, Engage patient in therapeutic group addressing interpersonal concerns.  Short Term Goals: Engage patient in aftercare planning with referrals and resources  Therapeutic Interventions: Assess for all discharge needs, 1 to 1 time with Social worker, Explore available resources and support systems, Assess for adequacy in community support network, Educate family and significant other(s) on suicide prevention, Complete Psychosocial Assessment, Interpersonal group therapy.  Evaluation of Outcomes: Met  Return home, follow up Neuropsych   Progress in  Treatment: Attending groups: Yes Participating in groups: Yes Taking medication as prescribed: Yes Toleration medication: Yes, no side effects reported at this time Family/Significant other contact made: No Patient understands diagnosis: No  Limited insight Discussing patient identified problems/goals with staff: Yes Medical problems stabilized or resolved: Yes Denies suicidal/homicidal ideation: Yes Issues/concerns per patient self-inventory: None Other: N/A  New problem(s) identified: None identified at this time.   New Short Term/Long Term Goal(s): "I came in for my husband"   Discharge Plan or Barriers:   Reason for Continuation of Hospitalization:  Mania  Medication stabilization   Estimated Length of Stay: 12/17  Attendees: Patient: Vanessa Mills 01/08/2017  4:56 PM  Physician: Maris Berger, MD 01/08/2017  4:56 PM  Nursing: Sena Hitch, RN 01/08/2017  4:56 PM  RN Care Manager: Lars Pinks, RN 01/08/2017  4:56 PM  Social Worker: Ripley Fraise 01/08/2017  4:56 PM  Recreational Therapist: Winfield Cunas 01/08/2017  4:56 PM  Other: Norberto Sorenson 01/08/2017  4:56 PM  Other:  01/08/2017  4:56 PM    Scribe for Treatment Team:  Roque Lias LCSW 01/08/2017 4:56 PM

## 2017-01-08 NOTE — Progress Notes (Signed)
Pt was seen pacing in her room earlier, appeared paranoid needing redirection at this time .

## 2017-01-08 NOTE — Progress Notes (Signed)
Recreation Therapy Notes  Date: 01/08/17 Time: 1000 Location: [address] Dayroom  Group Topic: Communication  Goal Area(s) Addresses:  Patient will effectively communicate with peers in group.  Patient will verbalize benefit of healthy communication. Patient will verbalize positive effect of healthy communication on post d/c goals.  Patient will identify communication techniques that made activity effective for group.   Behavioral Response: Engaged  Intervention:  Blank paper, pencils, geometrical drawings    Activity: Back to Back Drawings.  Patients were grouped in pairs.  Patients were seated back to back.  One person was given a picture to describe to their partner.  The person listening could not ask any questions of the speaker.  Once they were finished, they could compare pictures to see how accurate the listener was in drawing the picture.  Education: Communication, Discharge Planning  Education Outcome: Acknowledges understanding/In group clarification offered/Needs additional education.   Clinical Observations/Feedback: Pt stated she liked the fact that the speaker gave her time to finish drawing before moving on to the next instruction and gave encouragement.  Pt stated the picture didn't turn out exact because she wasn't able to ask questions.  Pt explained in a real conversation, she would be able to ask for clarity.  Pt stated she preferred being the listener because "I got too complicated when giving directions".    Vanessa Mills, LRT/CTRS         [name], Olamide Lahaie A 01/08/2017 11:50 AM

## 2017-01-09 DIAGNOSIS — I10 Essential (primary) hypertension: Secondary | ICD-10-CM

## 2017-01-09 DIAGNOSIS — Z560 Unemployment, unspecified: Secondary | ICD-10-CM

## 2017-01-09 NOTE — Progress Notes (Signed)
[facility] MD Progress Note  01/09/2017 4:52 PM Vanessa Mills  MRN:  [id number]  Subjective: Vanessa Mills reports, "I'm doing well. I just got back from the cafeteria. We are about to play Scrabbles".  Objective: Vanessa Mills is a 56 y/o F with history of Bipolar 1 who was admitted with worsening symptoms of mania and psychosis. Pt had arrived voluntarily to ED with her husband due to concern of worsening disorganized behaviors, mania, responding to internal stimuli, religious preoccupiation, and AH of the voice of Jesus. Pt has recent relevant history of discharge from inpatient unit at [facility] in July 2018 for similar presentation. Patient seen and chart reviewed. Discussed patient with treatment team. Pt today seen as calm, quiet & unassuming. She reports that she is doing well. She is thinking more clearly. However, she continues to report having auditory hallucination, which she describes as the voice of Jesus. Then she says, I think I imagined it & it is in my mind. She is taking & tolerating her medications without any adverse effects or reactions reported. Staff continue to provide support.  Principal Problem: Bipolar disorder, curr episode mixed, severe, with psychotic features ([place])  Diagnosis:   Patient Active Problem List   Diagnosis Date Noted  . Bipolar disorder, most recent episode manic ([place]) [F31.10] 01/30/2011    Priority: High  . Schizoaffective disorder, bipolar type ([place]) [F25.0] 01/07/2017  . Bipolar disorder, curr episode mixed, severe, with psychotic features ([place]) [F31.64] 07/31/2016  . Hot flash due to medication [R23.2, T50.905A] 09/20/2014  . History of long-term use of multiple prescription drugs [Z92.29] 05/20/2014  . Malignant neoplasm of overlapping sites of left breast in female, estrogen receptor positive ([place]) [E31.540, Z17.0] 03/18/2014  . DOE (dyspnea on exertion) [R06.09] 09/14/2013  . Abnormal EKG [R94.31] 09/14/2013  . Essential tremor [G25.0]  06/05/2012  . Abnormal involuntary movements(781.0) [R25.9] 06/05/2011  . 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 Chemotherapy follow-up examination [Z09] 12/28/2010  . Fatigue [R53.83] 12/28/2010   Total Time spent with patient: 25 minutes  Past Psychiatric History: See H&P.  Past Medical History:  Past Medical History:  Diagnosis Date  . Bipolar 1 disorder ([place])   . Breast cancer ([place]) 03/07/2011  . Breast cancer, ILC, Left, receptor+, Her2- 10/10/2010  . Chemotherapy follow-up examination 12/28/2010  . Chronic mental illness   . Crohn's disease ([place])   . Fatigue 12/28/2010  . GERD (gastroesophageal reflux disease)    does not take medications for   . Headache(784.0)    takes midodrine for migraines prn  . Meniere's syndrome   . Mental disorder    bipolar, takes saphris at hs  . Movement disorder   . PONV (postoperative nausea and vomiting)   . Raynaud's disease   . S/P radiation therapy 05/15/11 - 07/01/11   Left Breast: 4500 cGy/25 fractions with Boost to Left chest Wall/Mastectomy Scar for Toal dose of 5940 cGy  . Status post chemotherapy    4 cycles AC  . Status post chemotherapy 01/31/11 - 04/18/11   Taxol x 12 weeks    Past Surgical History:  Procedure Laterality Date  . 2 orbital fracture surgeries    . ABDOMINAL HYSTERECTOMY     uterus removed only  . BLADDER SUSPENSION     done 2005  . BREAST SURGERY     Axillary dissection  . MASTECTOMY MODIFIED RADICAL  10/23/2010   Left Dr Margot Chimes  . PORT-A-CATH REMOVAL  08/21/2011   Procedure: REMOVAL PORT-A-CATH;  Surgeon: Haywood Lasso,  MD;  Location: [facility];  Service: General;  Laterality: Right;  . PORTACATH PLACEMENT  11/28/2010   via right subclavian - Dr Margot Chimes  . SINUS EXPLORATION     Family History:  Family History  Problem Relation Age of Onset  . Hypertension Mother   . Diabetes Mother   . Heart failure Father   . Cancer Maternal Aunt        breast, thyroid, colonm  . Heart attack  Sister    Family Psychiatric  History: See H&P.  Social History:  Social History   Substance and Sexual Activity  Alcohol Use Yes  . Alcohol/week: 1.7 oz  . Types: 2 Glasses of wine, 1 Standard drinks or equivalent per week     Social History   Substance and Sexual Activity  Drug Use No   Comment: in college    Social History   Socioeconomic History  . Marital status: Married    Spouse name: Linna Hoff   . Number of children: 1  . Years of education: Masters  . Highest education level: None  Social Needs  . Financial resource strain: None  . Food insecurity - worry: None  . Food insecurity - inability: None  . Transportation needs - medical: None  . Transportation needs - non-medical: None  Occupational History  . Occupation: unemployed  Tobacco Use  . Smoking status: Never Smoker  . Smokeless tobacco: Never Used  Substance and Sexual Activity  . Alcohol use: Yes    Alcohol/week: 1.7 oz    Types: 2 Glasses of wine, 1 Standard drinks or equivalent per week  . Drug use: No    Comment: in college  . Sexual activity: Yes    Comment: erpr+/HER-2 neg.  Other Topics Concern  . None  Social History Narrative   Married to [place]   One dtr- 58 years old- [name]- in [place]   Patient has a Masters   Patient  has 1 child.    Additional Social History:   Sleep: "I slept well last night"  Appetite:  Fair  Current Medications: Current Facility-Administered Medications  Medication Dose Route Frequency Provider Last Rate Last Dose  . acetaminophen (TYLENOL) tablet 650 mg  650 mg Oral Q6H PRN Ethelene Hal, NP      . alum & mag hydroxide-simeth (MAALOX/MYLANTA) 200-200-20 MG/5ML suspension 30 mL  30 mL Oral Q4H PRN Ethelene Hal, NP      . hydrOXYzine (ATARAX/VISTARIL) tablet 25 mg  25 mg Oral TID PRN Ethelene Hal, NP   25 mg at 01/07/17 2335  . lisinopril (PRINIVIL,ZESTRIL) tablet 2.5 mg  2.5 mg Oral Daily Ethelene Hal, NP   2.5 mg at  01/09/17 0825  . magnesium hydroxide (MILK OF MAGNESIA) suspension 30 mL  30 mL Oral Daily PRN Ethelene Hal, NP      . paliperidone (INVEGA) 24 hr tablet 6 mg  6 mg Oral Daily Pennelope Bracken, MD   6 mg at 01/09/17 0824  . traZODone (DESYREL) tablet 100 mg  100 mg Oral QHS PRN Ethelene Hal, NP        Lab Results:  No results found for this or any previous visit (from the past 35 hour(s)). Blood Alcohol level:  Lab Results  Component Value Date   [facility] <10 11/04/2016   ETH <5 99/83/3825   Metabolic Disorder Labs: Lab Results  Component Value Date   HGBA1C 5.2 07/29/2016   MPG 103 07/29/2016  MPG 103 07/28/2016   Lab Results  Component Value Date   PROLACTIN 68.0 (H) 07/29/2016   PROLACTIN 44.6 (H) 07/28/2016   Lab Results  Component Value Date   CHOL 230 (H) 07/29/2016   TRIG 92 07/29/2016   HDL 100 07/29/2016   CHOLHDL 2.3 07/29/2016   VLDL 18 07/29/2016   LDLCALC 112 (H) 07/29/2016   LDLCALC 111 (H) 03/04/2013   Physical Findings: AIMS: Facial and Oral Movements Muscles of Facial Expression: None, normal Lips and Perioral Area: None, normal Jaw: None, normal Tongue: None, normal,Extremity Movements Upper (arms, wrists, hands, fingers): None, normal Lower (legs, knees, ankles, toes): None, normal, Trunk Movements Neck, shoulders, hips: None, normal, Overall Severity Severity of abnormal movements (highest score from questions above): None, normal Incapacitation due to abnormal movements: None, normal Patient's awareness of abnormal movements (rate only patient's report): No Awareness, Dental Status Current problems with teeth and/or dentures?: No Does patient usually wear dentures?: No  CIWA:    COWS:     Musculoskeletal: Strength & Muscle Tone: within normal limits Gait & Station: normal Patient leans: N/A  Psychiatric Specialty Exam: Physical Exam  Nursing note and vitals reviewed.   Review of Systems  Psychiatric/Behavioral:  Positive for depression and hallucinations. The patient is nervous/anxious.   All other systems reviewed and are negative.   Blood pressure 113/77, pulse 88, temperature 98.5 F (36.9 C), temperature source Oral, resp. rate 16, height 5' 7.75" (1.721 m), weight 55.8 kg (123 lb).Body mass index is 18.84 kg/m.  General Appearance: Fairly Groomed  Eye Contact:  Good  Speech:  Clear and Coherent and Normal Rate  Volume:  Normal  Mood:  Anxious  Affect:  Appropriate, Congruent and Constricted  Thought Process:  Coherent, Goal Directed and Descriptions of Associations: Loose  Orientation:  Full (Time, Place, and Person)  Thought Content:  Delusions and Hallucinations: Auditory  Suicidal Thoughts:  No  Homicidal Thoughts:  No  Memory:  Immediate;   Good Recent;   Good Remote;   Good  Judgement:  Impaired  Insight:  Lacking  Psychomotor Activity:  Normal  Concentration:  Concentration: Good  Recall:  [place] of Knowledge:  Fair  Language:  Fair  Akathisia:  No  Handed:    AIMS (if indicated):     Assets:  Communication Skills Resilience Social Support  ADL's:  Intact  Cognition:  WNL  Sleep:  Number of Hours: 5.25     Daily contact with patient to assess and evaluate symptoms and progress in treatment and Medication management: Patient is not at her baseline level of function mentally. There is still some evidence of psychosis. No evidence of mania. No dangerousness to self or others. However, there remains some odd & disorganized behavior.   Will continue today 01/09/2017 plan as below except where it is noted.  Mood control.   - Continue Invega 6 mg po daily.  Anxiety.   - Continue Hydroxyzine 25 mg po tid prn.  Insomnia.    - Continue Trazodone 100 mg po Q hs prn.  HTN.    - Continue Lisinopril 2.5 mg po daily.   Will monitor for medical issues as well as call consult as needed.   - SW will continue working on disposition.   - Patient to participate in  therapeutic milieu .   Encarnacion Slates, NP, PMHNP, FNP-BC. 01/09/2017, 4:52 PMPatient ID: Graciella Freer, female   DOB: 07-Feb-1960, 56 y.o.   MRN: [id number]

## 2017-01-09 NOTE — Progress Notes (Signed)
Patient rated her day an 8. Patient stated she thought she was going home today. Patient's goal for tomorrow is to have appropriate behavior so that she could go home.

## 2017-01-09 NOTE — Progress Notes (Signed)
Pt approaches nurses station with a copy of her admission paperwork sts that she is entitle to access to her records and demands to see them now.  Pt advised that is was 12{40 am and she would need to speak with her doctor.  Pt sts she has not see a doctor, "only a Psychiatrist".  Pt has demands to see doctor before noon this day and is unrealistic in her expectations.  Pt, with difficulty, is redirected to return to her room and get some rest.  Pt is in bed and awake.
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
## 2017-01-09 NOTE — Progress Notes (Signed)
Patient denies SI, HI and AVH.  Patient has been appropriately engaging on the unit.   Assess patient for safety, offer medications as prescribed, engage patient in 1:1 staff talks.   Continue to monitor as planned.

## 2017-01-09 NOTE — Progress Notes (Signed)
Recreation Therapy Notes  Date: 01/09/17 Time: 1000 Location: 500 Hall  Group Topic: Teambuilding  Goal Area(s) Addresses:  Patient will effectively work with peers in group.  Patient will verbalize benefit of healthy teambuilding. Patient will verbalize positive effect of healthy team building post d/c.  Patient will identify teambuilding techniques that made activity effective for group.   Behavioral Response: Engaged  Intervention: Nurse, children's  Activity: Sharks in [employer].  Each patient was given a rubber disk and one extra disk for the group.  Patient were to maneuver each patient from one end of the hallway to the other using only the disks provided.  If any person stepped off their disk, the group would have to start over.  Education: Communication, Discharge Planning  Education Outcome: Acknowledges understanding/In group clarification offered/Needs additional education.   Clinical Observations/Feedback:  Pt stated support systems "provide encouragement when you need it".  Pt stated the group used teamwork to complete the activity.  Pt also stated when you don't communicate with your support system "intimacy breaks down between you".  Victorino Sparrow, LRT/CTRS         Victorino Sparrow A 01/09/2017 11:34 AM

## 2017-01-09 NOTE — Progress Notes (Addendum)
  DATA ACTION RESPONSE  Objective- Pt. is visible in the dayroom, seen reading a book. Presents with a flat/labile affect and mood. Pt frequently approach staff with questions; preoccupied. No new c/o.Superficial with interaction.  Subjective- Denies having any SI/HI/AVH/Pain at this time. Is cooperative and remains safe on the unit.  1:1 interaction in private to establish rapport. Encouragement, education, & support given from staff.      Safety maintained with Q 15 checks. Continue with POC.

## 2017-01-09 NOTE — Progress Notes (Signed)
Pt states she does not want to accept phone calls from husband at this time.

## 2017-01-10 MED ORDER — PALIPERIDONE PALMITATE 234 MG/1.5ML IM SUSP
234.0000 mg | Freq: Once | INTRAMUSCULAR | Status: AC
  Administered 2017-01-10: 234 mg via INTRAMUSCULAR
  Filled 2017-01-10: qty 1.5

## 2017-01-10 MED ORDER — PALIPERIDONE PALMITATE 156 MG/ML IM SUSP: 156.0000 mg | INTRAMUSCULAR | Status: DC

## 2017-01-10 NOTE — Progress Notes (Signed)
Recreation Therapy Notes  Date: 01/10/14 Time: 1000 Location: 500 Hall Dayroom  Group Topic: Wellness  Goal Area(s) Addresses:  Patient will define components of whole wellness. Patient will verbalize benefit of whole wellness.  Behavioral Response: Engaged  Intervention:  Chairs, small beach ball  Activity: Keep It Chartered certified accountant.  Patients were seated in a circle in the dayroom.  Patients were to pass the ball back and forth to each other.  Patients could bounce the balls off the floor but the ball could not come to a complete stop.  LRT counted the number of hits the patients made on the ball.  If the ball came to a stop, the count would start over.    Education: Wellness, Dentist.   Education Outcome: Acknowledges education/In group clarification offered/Needs additional education.   Clinical Observations/Feedback: Pt was engaged and encouraged peers.  Pt was smiling and social.  Pt would take breaks during the game when she needed to but would join back in.    Victorino Sparrow, LRT/CTRS      [name], Haniah Penny A 01/10/2017 11:12 AM

## 2017-01-10 NOTE — Progress Notes (Signed)
[facility] MD Progress Note  01/10/2017 1:10 PM [name]  MRN:  [id number] Subjective:   Vanessa Mills is a 56 y/o F with history of Bipolar 1 who was admitted with worsening symptoms of mania and psychosis. Pt had arrived voluntarily to ED with her husband due to concern of worsening disorganized behaviors, mania, responding to internal stimuli, religious preoccupiation, and AH of the voice of Jesus. She was discontinued from her home regimen to which she had poor adherence and started on Invega (oral formulation) with plan to transition to [place].  Today upon evaluation, pt reports she is well overall, stating, "Things are going well." She is generally guarded with her responses, for example, stating that she has no AH, but then later stating that she is receiving "direct messages" from God. She declines to share any specific examples when asked. She denies VH/SI/HI. She reports she is sleeping well and her appetite is good. She has no specific complaints today. She appears somewhat anxious and she requests for a female chaperone to be present during the interview. RN staff also reported vaguely bizarre behaviors such as a bizarre gait for no apparent reason, which pt spontaneously stopped using when asked about it. Discussed with patient about her husband, and she shares that they have spoken but the conversations were simply "cordial." She is open to what the medical team's treatment recommendation would be, and we discussed moving forward with plan to transition to [place] as pt has been tolerating the oral formulation of Invega without difficulty or side effects. Pt was in agreement with that plan, and she made somewhat bizarre, incongruent statement, "Oh, I'm just going to kiss your hand for that." Before pt could be redirected, she grabbed the hand of this provider and kissed it, and then pt was redirected to not contact any staff in the future. She had no further  questions, comments, or concerns.    Principal Problem: Bipolar disorder, curr episode mixed, severe, with psychotic features ([place]) Diagnosis:   Patient Active Problem List   Diagnosis Date Noted  . Schizoaffective disorder, bipolar type ([place]) [F25.0] 01/07/2017  . Bipolar disorder, curr episode mixed, severe, with psychotic features ([place]) [F31.64] 07/31/2016  . Hot flash due to medication [R23.2, T50.905A] 09/20/2014  . History of long-term use of multiple prescription drugs [Z92.29] 05/20/2014  . Malignant neoplasm of overlapping sites of left breast in female, estrogen receptor positive ([place]) [G62.694, Z17.0] 03/18/2014  . DOE (dyspnea on exertion) [R06.09] 09/14/2013  . Abnormal EKG [R94.31] 09/14/2013  . Essential tremor [G25.0] 06/05/2012  . Abnormal involuntary movements(781.0) [R25.9] 06/05/2011  . 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 Bipolar disorder, most recent episode manic ([place]) [F31.10] 01/30/2011  . Chemotherapy follow-up examination [Z09] 12/28/2010  . Fatigue [R53.83] 12/28/2010   Total Time spent with patient: 30 minutes  Past Psychiatric History: see H&P  Past Medical History:  Past Medical History:  Diagnosis Date  . Bipolar 1 disorder ([place])   . Breast cancer ([place]) 03/07/2011  . Breast cancer, ILC, Left, receptor+, Her2- 10/10/2010  . Chemotherapy follow-up examination 12/28/2010  . Chronic mental illness   . Crohn's disease ([place])   . Fatigue 12/28/2010  . GERD (gastroesophageal reflux disease)    does not take medications for   . Headache(784.0)    takes midodrine for migraines prn  . Meniere's syndrome   . Mental disorder    bipolar, takes saphris at hs  . Movement disorder   . PONV (postoperative nausea and  vomiting)   . Raynaud's disease   . S/P radiation therapy 05/15/11 - 07/01/11   Left Breast: 4500 cGy/25 fractions with Boost to Left chest Wall/Mastectomy Scar for Toal dose of 5940 cGy  . Status post chemotherapy    4 cycles AC  . Status  post chemotherapy 01/31/11 - 04/18/11   Taxol x 12 weeks    Past Surgical History:  Procedure Laterality Date  . 2 orbital fracture surgeries    . ABDOMINAL HYSTERECTOMY     uterus removed only  . BLADDER SUSPENSION     done 2005  . BREAST SURGERY     Axillary dissection  . MASTECTOMY MODIFIED RADICAL  10/23/2010   Left Dr Margot Chimes  . PORT-A-CATH REMOVAL  08/21/2011   Procedure: REMOVAL PORT-A-CATH;  Surgeon: Haywood Lasso, MD;  Location: [place];  Service: General;  Laterality: Right;  . PORTACATH PLACEMENT  11/28/2010   via right subclavian - Dr Margot Chimes  . SINUS EXPLORATION     Family History:  Family History  Problem Relation Age of Onset  . Hypertension Mother   . Diabetes Mother   . Heart failure Father   . Cancer Maternal Aunt        breast, thyroid, colonm  . Heart attack Sister    Family Psychiatric  History: see H&P Social History:  Social History   Substance and Sexual Activity  Alcohol Use Yes  . Alcohol/week: 1.7 oz  . Types: 2 Glasses of wine, 1 Standard drinks or equivalent per week     Social History   Substance and Sexual Activity  Drug Use No   Comment: in college    Social History   Socioeconomic History  . Marital status: Married    Spouse name: Vanessa Mills   . Number of children: 1  . Years of education: Masters  . Highest education level: None  Social Needs  . Financial resource strain: None  . Food insecurity - worry: None  . Food insecurity - inability: None  . Transportation needs - medical: None  . Transportation needs - non-medical: None  Occupational History  . Occupation: unemployed  Tobacco Use  . Smoking status: Never Smoker  . Smokeless tobacco: Never Used  Substance and Sexual Activity  . Alcohol use: Yes    Alcohol/week: 1.7 oz    Types: 2 Glasses of wine, 1 Standard drinks or equivalent per week  . Drug use: No    Comment: in college  . Sexual activity: Yes    Comment: erpr+/HER-2 neg.  Other Topics  Concern  . None  Social History Narrative   Married to [place]   One dtr- 56 years old- Vanessa Mills Comment- in [place]   Patient has a Masters   Patient  has 1 child.    Additional Social History:                         Sleep: Good  Appetite:  Good  Current Medications: Current Facility-Administered Medications  Medication Dose Route Frequency Provider Last Rate Last Dose  . acetaminophen (TYLENOL) tablet 650 mg  650 mg Oral Q6H PRN Ethelene Hal, NP      . alum & mag hydroxide-simeth (MAALOX/MYLANTA) 200-200-20 MG/5ML suspension 30 mL  30 mL Oral Q4H PRN Ethelene Hal, NP      . hydrOXYzine (ATARAX/VISTARIL) tablet 25 mg  25 mg Oral TID PRN Ethelene Hal, NP   25 mg at 01/07/17 2335  .  lisinopril (PRINIVIL,ZESTRIL) tablet 2.5 mg  2.5 mg Oral Daily Ethelene Hal, NP   2.5 mg at 01/10/17 0092  . magnesium hydroxide (MILK OF MAGNESIA) suspension 30 mL  30 mL Oral Daily PRN Ethelene Hal, NP      . paliperidone (INVEGA SUSTENNA) injection 234 mg  234 mg Intramuscular Once Pennelope Bracken, MD       And  . Derrill Memo ON 01/17/2017] paliperidone (INVEGA SUSTENNA) injection 156 mg  156 mg Intramuscular Q30 days Maris Berger T, MD      . paliperidone (INVEGA) 24 hr tablet 6 mg  6 mg Oral Daily Pennelope Bracken, MD   6 mg at 01/10/17 3300  . traZODone (DESYREL) tablet 100 mg  100 mg Oral QHS PRN Ethelene Hal, NP        Lab Results: No results found for this or any previous visit (from the past 54 hour(s)).  Blood Alcohol level:  Lab Results  Component Value Date   ETH <10 11/04/2016   ETH <5 76/22/6333    Metabolic Disorder Labs: Lab Results  Component Value Date   HGBA1C 5.2 07/29/2016   MPG 103 07/29/2016   MPG 103 07/28/2016   Lab Results  Component Value Date   PROLACTIN 68.0 (H) 07/29/2016   PROLACTIN 44.6 (H) 07/28/2016   Lab Results  Component Value Date   CHOL 230 (H) 07/29/2016   TRIG 92  07/29/2016   HDL 100 07/29/2016   CHOLHDL 2.3 07/29/2016   VLDL 18 07/29/2016   LDLCALC 112 (H) 07/29/2016   LDLCALC 111 (H) 03/04/2013    Physical Findings: AIMS: Facial and Oral Movements Muscles of Facial Expression: None, normal Lips and Perioral Area: None, normal Jaw: None, normal Tongue: None, normal,Extremity Movements Upper (arms, wrists, hands, fingers): None, normal Lower (legs, knees, ankles, toes): None, normal, Trunk Movements Neck, shoulders, hips: None, normal, Overall Severity Severity of abnormal movements (highest score from questions above): None, normal Incapacitation due to abnormal movements: None, normal Patient's awareness of abnormal movements (rate only patient's report): No Awareness, Dental Status Current problems with teeth and/or dentures?: No Does patient usually wear dentures?: No  CIWA:    COWS:     Musculoskeletal: Strength & Muscle Tone: within normal limits Gait & Station: normal Patient leans: N/A  Psychiatric Specialty Exam: Physical Exam  Nursing note and vitals reviewed.   Review of Systems  Constitutional: Negative for chills and fever.  Gastrointestinal: Negative for heartburn, nausea and vomiting.  Psychiatric/Behavioral: Positive for hallucinations. Negative for depression and suicidal ideas. The patient is not nervous/anxious.     Blood pressure (!) 159/92, pulse 67, temperature 98.2 F (36.8 C), temperature source Oral, resp. rate 16, height 5' 7.75" (1.721 m), weight 55.8 kg (123 lb).Body mass index is 18.84 kg/m.  General Appearance: Casual and Fairly Groomed  Eye Contact:  Fair  Speech:  Clear and Coherent and Normal Rate  Volume:  Normal  Mood:  Euthymic  Affect:  Appropriate, Congruent and Constricted  Thought Process:  Coherent, Goal Directed and Descriptions of Associations: Loose  Orientation:  Full (Time, Place, and Person)  Thought Content:  Delusions and Hallucinations: Auditory  Suicidal Thoughts:  No   Homicidal Thoughts:  No  Memory:  Immediate;   Good Recent;   Good Remote;   Good  Judgement:  Impaired  Insight:  Lacking  Psychomotor Activity:  Normal  Concentration:  Concentration: Poor  Recall:  [place] of Knowledge:  Fair  Language:  Fair  Akathisia:  No  Handed:    AIMS (if indicated):     Assets:  Armed forces logistics/support/administrative officer Physical Health Resilience Social Support  ADL's:  Intact  Cognition:  WNL  Sleep:  Number of Hours: 5.5     Treatment Plan Summary: Daily contact with patient to assess and evaluate symptoms and progress in treatment and Medication management. Pt continues to have bizarre behaviors, responds to internal stimuli, and endorses AH. She has tolerated Invega oral formulation and we will plan to transition to [place] starting today.  - Continue inpatient hospitalization  - Bipolar I with psychotic features              - Continue Invega 98m po qDay             - Start ILorayne BenderSustenna 2362mIM once today with next dose of 1562mM on 01/17/17 +/- 4 days  - anxiety     - Continue atarax 10m71mD prn anxiety - HTN    - Continue lisinopril 2.5mg 22my - insomnia    - Continue trazodone 100mg 77mprn insomnia  -Encourage participation in groups and therapeutic milieu -Discharge planning will be ongoing    ChristPennelope Bracken2/14/2018, 1:10 PM

## 2017-01-10 NOTE — Progress Notes (Signed)
Adult Psychoeducational Group Note  Date:  01/10/2017 Time:  10:39 PM  Group Topic/Focus:  Wrap-Up Group:   The focus of this group is to help patients review their daily goal of treatment and discuss progress on daily workbooks.  Participation Level:  Minimal  Participation Quality:  Appropriate  Affect:  Appropriate  Cognitive:  Oriented  Insight: Appropriate  Engagement in Group:  Engaged  Modes of Intervention:  Socialization and Support  Additional Comments:  Patient attended and participated in group tonight. She reports having a wonderful day. She socialized with peers, watched television and read her bible. She also had visitors who brought her some clothing.  Salley Scarlet [facility] 01/10/2017, 10:39 PM

## 2017-01-10 NOTE — Plan of Care (Signed)
  Activity: Sleeping patterns will improve 01/10/2017 2007 - Progressing by Providence Crosby, RN Note Pt slept over 5 hrs last night   Coping: Ability to verbalize frustrations and anger appropriately will improve 01/10/2017 2007 - Progressing by Providence Crosby, RN Note Pt has been appropriate on the unit this evening   Safety: Periods of time without injury will increase 01/10/2017 2007 - Progressing by Providence Crosby, RN Note Pt safe on the unit at this time

## 2017-01-10 NOTE — Progress Notes (Signed)
D " Vanessa Mills" ( as patient requests this nurse call her ) is observed OOB UAL on the 500 hall today..she tolerates this fair to poor, in that she was more isolative to her room, noted by staff that have been observing her throughout the week.. She demonstrates guarded, suspicious demeanor. She did complete her daily assessment this morning and on this she wrote she denied experiencing SI today and she rated her depression, hopelessness and anxeity " 0/0/1", respectively. She received Invega 234  IM today per MD order and is continuing po Invega also, tolerated without diff.  She paces when she gets anxious , and / or agitated and has been seen pacing in her room 2 times by this staff member. R Safety is in place and poc cioncludes continuing withtherpaeutic relationship, providing support and maintaining safety.

## 2017-01-10 NOTE — Progress Notes (Signed)
D: Pt denies SI/HI/AVh. Pt is pleasant and cooperative. Pt stated she was ok, and had a good day. Pt keeps to herself even when peers are around. Pt presents suspicious , guarded and forwards little at this time.   A: Pt was offered support and encouragement.  Pt was encourage to attend groups. Q 15 minute checks were done for safety.   R: safety maintained on unit.

## 2017-01-11 DIAGNOSIS — R443 Hallucinations, unspecified: Secondary | ICD-10-CM

## 2017-01-11 NOTE — Progress Notes (Signed)
Nursing Progress Note: 7p-7a D: Pt currently presents with a anxious/pleasant/pacing affect and behavior. Pt states "I had a good today. I am fine. I just worry about everyone else." Interacting minimally with the milieu. Pt reports good sleep during the previous night with current medication regimen. Pt did not attend wrap-up group.  A: Pt provided with medications per providers orders. Pt's labs and vitals were monitored throughout the night. Pt supported emotionally and encouraged to express concerns and questions. Pt educated on medications.  R: Pt's safety ensured with 15 minute and environmental checks. Pt currently denies SI, HI, and AVH. Pt verbally contracts to seek staff if SI,HI, or AVH occurs and to consult with staff before acting on any harmful thoughts. Will continue to monitor.

## 2017-01-11 NOTE — [facility] Group Notes (Signed)
Adult Psychoeducational Group Note  Date:  01/11/2017 Time:  8:56 PM  Group Topic/Focus:  Wrap-Up Group:   The focus of this group is to help patients review their daily goal of treatment and discuss progress on daily workbooks.  Participation Level:  Did Not Attend   [place], [place] 01/11/2017, 8:56 PM

## 2017-01-11 NOTE — Progress Notes (Signed)
[facility] MD Progress Note  01/11/2017 2:32 PM Vanessa Mills  MRN:  [id number]   Subjective:  Vanessa Mills reports " I am doing okay"  Objective: Vanessa Mills is awake, alert. Seen resting in bed journling. Reports "  I am feeling fine" reports my husband asked me to come in because I was manic.  Vanessa Mills appears to be thought blocking and responding to internal stimuli  throughout this discussion. Denies suicidal or homicidal ideation. Denies auditory or visual hallucination. Patient reports she is medication compliant without mediation side effects.  Reports good appetite and reports she is  resting well Support, encouragement and reassurance was provided.    Principal Problem: Bipolar disorder, curr episode mixed, severe, with psychotic features ([place]) Diagnosis:   Patient Active Problem List   Diagnosis Date Noted  . Schizoaffective disorder, bipolar type ([place]) [F25.0] 01/07/2017  . Bipolar disorder, curr episode mixed, severe, with psychotic features ([place]) [F31.64] 07/31/2016  . Hot flash due to medication [R23.2, T50.905A] 09/20/2014  . History of long-term use of multiple prescription drugs [Z92.29] 05/20/2014  . Malignant neoplasm of overlapping sites of left breast in female, estrogen receptor positive ([place]) [J69.678, Z17.0] 03/18/2014  . DOE (dyspnea on exertion) [R06.09] 09/14/2013  . Abnormal EKG [R94.31] 09/14/2013  . Essential tremor [G25.0] 06/05/2012  . Abnormal involuntary movements(781.0) [R25.9] 06/05/2011  . 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 Bipolar disorder, most recent episode manic ([place]) [F31.10] 01/30/2011  . Chemotherapy follow-up examination [Z09] 12/28/2010  . Fatigue [R53.83] 12/28/2010   Total Time spent with patient: 30 minutes  Past Psychiatric History: see H&P  Past Medical History:  Past Medical History:  Diagnosis Date  . Bipolar 1 disorder ([place])   . Breast cancer ([place]) 03/07/2011  . Breast cancer, ILC, Left, receptor+,  Her2- 10/10/2010  . Chemotherapy follow-up examination 12/28/2010  . Chronic mental illness   . Crohn's disease ([place])   . Fatigue 12/28/2010  . GERD (gastroesophageal reflux disease)    does not take medications for   . Headache(784.0)    takes midodrine for migraines prn  . Meniere's syndrome   . Mental disorder    bipolar, takes saphris at hs  . Movement disorder   . PONV (postoperative nausea and vomiting)   . Raynaud's disease   . S/P radiation therapy 05/15/11 - 07/01/11   Left Breast: 4500 cGy/25 fractions with Boost to Left chest Wall/Mastectomy Scar for Toal dose of 5940 cGy  . Status post chemotherapy    4 cycles AC  . Status post chemotherapy 01/31/11 - 04/18/11   Taxol x 12 weeks    Past Surgical History:  Procedure Laterality Date  . 2 orbital fracture surgeries    . ABDOMINAL HYSTERECTOMY     uterus removed only  . BLADDER SUSPENSION     done 2005  . BREAST SURGERY     Axillary dissection  . MASTECTOMY MODIFIED RADICAL  10/23/2010   Left Dr Margot Chimes  . PORT-A-CATH REMOVAL  08/21/2011   Procedure: REMOVAL PORT-A-CATH;  Surgeon: Haywood Lasso, MD;  Location: [facility];  Service: General;  Laterality: Right;  . PORTACATH PLACEMENT  11/28/2010   via right subclavian - Dr Margot Chimes  . SINUS EXPLORATION     Family History:  Family History  Problem Relation Age of Onset  . Hypertension Mother   . Diabetes Mother   . Heart failure Father   . Cancer Maternal Aunt        breast, thyroid, colonm  . Heart  attack Sister    Family Psychiatric  History: see H&P Social History:  Social History   Substance and Sexual Activity  Alcohol Use Yes  . Alcohol/week: 1.7 oz  . Types: 2 Glasses of wine, 1 Standard drinks or equivalent per week     Social History   Substance and Sexual Activity  Drug Use No   Comment: in college    Social History   Socioeconomic History  . Marital status: Married    Spouse name: Linna Hoff   . Number of children: 1  . Years of  education: Masters  . Highest education level: None  Social Needs  . Financial resource strain: None  . Food insecurity - worry: None  . Food insecurity - inability: None  . Transportation needs - medical: None  . Transportation needs - non-medical: None  Occupational History  . Occupation: unemployed  Tobacco Use  . Smoking status: Never Smoker  . Smokeless tobacco: Never Used  Substance and Sexual Activity  . Alcohol use: Yes    Alcohol/week: 1.7 oz    Types: 2 Glasses of wine, 1 Standard drinks or equivalent per week  . Drug use: No    Comment: in college  . Sexual activity: Yes    Comment: erpr+/HER-2 neg.  Other Topics Concern  . None  Social History Narrative   Married to [place]   One dtr- 58 years old- [name]- in [place]   Patient has a Masters   Patient  has 1 child.    Additional Social History:                         Sleep: Good  Appetite:  Good  Current Medications: Current Facility-Administered Medications  Medication Dose Route Frequency Provider Last Rate Last Dose  . acetaminophen (TYLENOL) tablet 650 mg  650 mg Oral Q6H PRN Ethelene Hal, NP      . alum & mag hydroxide-simeth (MAALOX/MYLANTA) 200-200-20 MG/5ML suspension 30 mL  30 mL Oral Q4H PRN Ethelene Hal, NP      . hydrOXYzine (ATARAX/VISTARIL) tablet 25 mg  25 mg Oral TID PRN Ethelene Hal, NP   25 mg at 01/07/17 2335  . lisinopril (PRINIVIL,ZESTRIL) tablet 2.5 mg  2.5 mg Oral Daily Ethelene Hal, NP   2.5 mg at 01/11/17 1111  . magnesium hydroxide (MILK OF MAGNESIA) suspension 30 mL  30 mL Oral Daily PRN Ethelene Hal, NP      . Derrill Memo ON 01/17/2017] paliperidone (INVEGA SUSTENNA) injection 156 mg  156 mg Intramuscular Q30 days Maris Berger T, MD      . paliperidone (INVEGA) 24 hr tablet 6 mg  6 mg Oral Daily Pennelope Bracken, MD   6 mg at 01/11/17 1112  . traZODone (DESYREL) tablet 100 mg  100 mg Oral QHS PRN Ethelene Hal, NP        Lab Results: No results found for this or any previous visit (from the past 41 hour(s)).  Blood Alcohol level:  Lab Results  Component Value Date   ETH <10 11/04/2016   ETH <5 16/10/9602    Metabolic Disorder Labs: Lab Results  Component Value Date   HGBA1C 5.2 07/29/2016   MPG 103 07/29/2016   MPG 103 07/28/2016   Lab Results  Component Value Date   PROLACTIN 68.0 (H) 07/29/2016   PROLACTIN 44.6 (H) 07/28/2016   Lab Results  Component Value Date   CHOL 230 (H)  07/29/2016   TRIG 92 07/29/2016   HDL 100 07/29/2016   CHOLHDL 2.3 07/29/2016   VLDL 18 07/29/2016   LDLCALC 112 (H) 07/29/2016   LDLCALC 111 (H) 03/04/2013    Physical Findings: AIMS: Facial and Oral Movements Muscles of Facial Expression: None, normal Lips and Perioral Area: None, normal Jaw: None, normal Tongue: None, normal,Extremity Movements Upper (arms, wrists, hands, fingers): None, normal Lower (legs, knees, ankles, toes): None, normal, Trunk Movements Neck, shoulders, hips: None, normal, Overall Severity Severity of abnormal movements (highest score from questions above): None, normal Incapacitation due to abnormal movements: None, normal Patient's awareness of abnormal movements (rate only patient's report): No Awareness, Dental Status Current problems with teeth and/or dentures?: No Does patient usually wear dentures?: No  CIWA:    COWS:     Musculoskeletal: Strength & Muscle Tone: within normal limits Gait & Station: normal Patient leans: N/A  Psychiatric Specialty Exam: Physical Exam  Nursing note and vitals reviewed. Constitutional: She appears well-developed.  Cardiovascular: Normal rate.  Neurological: She is alert.  Psychiatric: She has a normal mood and affect.    Review of Systems  Psychiatric/Behavioral: Positive for hallucinations. Negative for suicidal ideas. The patient is not nervous/anxious.     Blood pressure 136/83, pulse 75, temperature (!) 97.4  F (36.3 C), temperature source Oral, resp. rate 16, height 5' 7.75" (1.721 m), weight 55.8 kg (123 lb).Body mass index is 18.84 kg/m.  General Appearance: Casual  Eye Contact:  Fair  Speech:  Clear and Coherent and Normal Rate  Volume:  Normal  Mood:  Euthymic  Affect:  Appropriate, Congruent and Constricted  Thought Process:  Coherent, Goal Directed and Descriptions of Associations: Loose  Orientation:  Full (Time, Place, and Person)  Thought Content:  Delusions and Hallucinations: Auditory  Suicidal Thoughts:  No  Homicidal Thoughts:  No  Memory:  Immediate;   Good Recent;   Good Remote;   Good  Judgement:  Impaired  Insight:  Lacking  Psychomotor Activity:  Normal  Concentration:  Concentration: Poor  Recall:  [place] of Knowledge:  Fair  Language:  Fair  Akathisia:  No  Handed:    AIMS (if indicated):     Assets:  Armed forces logistics/support/administrative officer Physical Health Resilience Social Support  ADL's:  Intact  Cognition:  WNL  Sleep:  Number of Hours: 4.75     Treatment Plan Summary: Daily contact with patient to assess and evaluate symptoms and progress in treatment and Medication management  Continue with current treatment plan on 01/11/2017 except where noted  - Bipolar I with psychotic features              - Continue Invega 67m po qDay             - Contiune Invega Sustenna 2350mIM once today with next dose of 15646mM on 01/17/17 +/- 4 days  - anxiety   - Continue atarax 47m15mD prn anxiety - HTN    - Continue lisinopril 2.5mg 26my - insomnia    - Continue trazodone 100mg 40mprn insomnia  -Encourage participation in groups and therapeutic milieu -Discharge planning will be ongoing    [facility] Center2/15/2018, 2:32 PM Agree with NP Progress Note

## 2017-01-11 NOTE — Progress Notes (Signed)
Dar Note: Patient presents with blunted affect and mood.  Patient is withdrawn and isolative to her room.  Medications given after several encouragements.  Denies suicidal thoughts, auditory and visual hallucinations.  Attended group with minimal participated.  Routine safety checks maintained.  Support offered as needed.  Patient is safe on the unit.

## 2017-01-11 NOTE — [facility] Group Notes (Signed)
  [facility] LCSW Group Therapy Note  Date/Time:  01/11/2017 11:15AM-12:00PM  Type of Therapy and Topic:  Group Therapy:  Feelings About Hospitalization  Participation Level:  Minimal   Description of Group This process group involved patients discussing their feelings related to being hospitalized, as well as the benefits they see to being in the hospital.  These feelings and benefits were itemized.  The group then brainstormed specific ways in which they could seek those same benefits when they discharge and return home.  Therapeutic Goals 1. Patient will identify and describe positive and negative feelings related to hospitalization 2. Patient will verbalize benefits of hospitalization to themselves personally 3. Patients will brainstorm together ways they can obtain similar benefits in the outpatient setting, identify barriers to wellness and possible solutions  Summary of Patient Progress:  The patient expressed her primary feelings about being hospitalized are good, because she needs to make plans and get meds.  She soon left the room and did not return.  Therapeutic Modalities Cognitive Behavioral Therapy Motivational Interviewing    Selmer Dominion, LCSW 01/11/2017, 1:28 PM

## 2017-01-12 NOTE — [facility] Group Notes (Signed)
[facility] LCSW Group Therapy Note  Date/Time:  01/12/2017  11:00AM-12:00PM  Type of Therapy and Topic:  Group Therapy:  Music and Mood  Participation Level:  Active   Description of Group: In this process group, members listened to a variety of genres of music and identified that different types of music evoke different responses.  Patients were encouraged to identify music that was soothing for them and music that was energizing for them.  Patients discussed how this knowledge can help with wellness and recovery in various ways including managing depression and anxiety as well as encouraging healthy sleep habits.    Therapeutic Goals: 1. Patients will explore the impact of different varieties of music on mood 2. Patients will verbalize the thoughts they have when listening to different types of music 3. Patients will identify music that is soothing to them as well as music that is energizing to them 4. Patients will discuss how to use this knowledge to assist in maintaining wellness and recovery 5. Patients will explore the use of music as a coping skill  Summary of Patient Progress:  At the beginning of group, patient expressed that she felt "fantastic" and she cried, danced, and was very expressive throughout group.  At the end she stated she felt "exuberant."  Therapeutic Modalities: Solution Focused Brief Therapy Motivational Interviewing Activity   Selmer Dominion, LCSW 01/12/2017 12:27 PM

## 2017-01-12 NOTE — Plan of Care (Signed)
  Activity: Sleeping patterns will improve 01/12/2017 1205 - Progressing by Marya Landry, RN Note Pt reported adequate sleep last night.

## 2017-01-12 NOTE — Progress Notes (Signed)
Pt denies SI, HI, and AVH. She is pleasant, cooperative, and appropriately groomed. She seems paranoid in conversation, however, or at least guarded. She asked this writer to lock up a single earring in her belongings, which was done and documented. Pt has been compliant with medications. She interacts little with peers. She reports good sleep, good appetite, normal energy level, and good concentration. She rated her depression, feelings of hopelessness, and anxiety all at zero. Her goal is "to please Jesus; it is the Lord's day."   A: Meds given as ordered. No PRNs given or requested. Q15 safety checks maintained. Support/encouragement offered.  R: Pt remains free from harm and continues with treatment. Will continue to monitor for needs/safety.

## 2017-01-12 NOTE — Progress Notes (Signed)
[facility] MD Progress Note  01/12/2017 11:49 AM Vanessa Mills  MRN:  [id number]    Objective: Vanessa Mills seen  resting in bed reading her Bible. Patient is pleasant and smiling. Reports feeling better that every. Reports she was experiencing some dizziness this morning, however attributes her symptoms to not eating breakfast.   Vanessa Mills appears to be thought blocking and slow to respond with answers.Denies suicidal or homicidal ideation. Denies auditory or visual hallucination during this assessment. Support, encouragement and reassurance was provided.    Principal Problem: Bipolar disorder, curr episode mixed, severe, with psychotic features ([place]) Diagnosis:   Patient Active Problem List   Diagnosis Date Noted  . Schizoaffective disorder, bipolar type ([place]) [F25.0] 01/07/2017  . Bipolar disorder, curr episode mixed, severe, with psychotic features ([place]) [F31.64] 07/31/2016  . Hot flash due to medication [R23.2, T50.905A] 09/20/2014  . History of long-term use of multiple prescription drugs [Z92.29] 05/20/2014  . Malignant neoplasm of overlapping sites of left breast in female, estrogen receptor positive ([place]) [S01.093, Z17.0] 03/18/2014  . DOE (dyspnea on exertion) [R06.09] 09/14/2013  . Abnormal EKG [R94.31] 09/14/2013  . Essential tremor [G25.0] 06/05/2012  . Abnormal involuntary movements(781.0) [R25.9] 06/05/2011  . 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 Bipolar disorder, most recent episode manic ([place]) [F31.10] 01/30/2011  . Chemotherapy follow-up examination [Z09] 12/28/2010  . Fatigue [R53.83] 12/28/2010   Total Time spent with patient: 30 minutes  Past Psychiatric History: see H&P  Past Medical History:  Past Medical History:  Diagnosis Date  . Bipolar 1 disorder ([place])   . Breast cancer ([place]) 03/07/2011  . Breast cancer, ILC, Left, receptor+, Her2- 10/10/2010  . Chemotherapy follow-up examination 12/28/2010  . Chronic mental illness   . Crohn's  disease ([place])   . Fatigue 12/28/2010  . GERD (gastroesophageal reflux disease)    does not take medications for   . Headache(784.0)    takes midodrine for migraines prn  . Meniere's syndrome   . Mental disorder    bipolar, takes saphris at hs  . Movement disorder   . PONV (postoperative nausea and vomiting)   . Raynaud's disease   . S/P radiation therapy 05/15/11 - 07/01/11   Left Breast: 4500 cGy/25 fractions with Boost to Left chest Wall/Mastectomy Scar for Toal dose of 5940 cGy  . Status post chemotherapy    4 cycles AC  . Status post chemotherapy 01/31/11 - 04/18/11   Taxol x 12 weeks    Past Surgical History:  Procedure Laterality Date  . 2 orbital fracture surgeries    . ABDOMINAL HYSTERECTOMY     uterus removed only  . BLADDER SUSPENSION     done 2005  . BREAST SURGERY     Axillary dissection  . MASTECTOMY MODIFIED RADICAL  10/23/2010   Left Dr Margot Chimes  . PORT-A-CATH REMOVAL  08/21/2011   Procedure: REMOVAL PORT-A-CATH;  Surgeon: Haywood Lasso, MD;  Location: [facility];  Service: General;  Laterality: Right;  . PORTACATH PLACEMENT  11/28/2010   via right subclavian - Dr Margot Chimes  . SINUS EXPLORATION     Family History:  Family History  Problem Relation Age of Onset  . Hypertension Mother   . Diabetes Mother   . Heart failure Father   . Cancer Maternal Aunt        breast, thyroid, colonm  . Heart attack Sister    Family Psychiatric  History: see H&P Social History:  Social History   Substance and Sexual Activity  Alcohol Use Yes  . Alcohol/week: 1.7 oz  . Types: 2 Glasses of wine, 1 Standard drinks or equivalent per week     Social History   Substance and Sexual Activity  Drug Use No   Comment: in college    Social History   Socioeconomic History  . Marital status: Married    Spouse name: Linna Hoff   . Number of children: 1  . Years of education: Masters  . Highest education level: None  Social Needs  . Financial resource strain: None   . Food insecurity - worry: None  . Food insecurity - inability: None  . Transportation needs - medical: None  . Transportation needs - non-medical: None  Occupational History  . Occupation: unemployed  Tobacco Use  . Smoking status: Never Smoker  . Smokeless tobacco: Never Used  Substance and Sexual Activity  . Alcohol use: Yes    Alcohol/week: 1.7 oz    Types: 2 Glasses of wine, 1 Standard drinks or equivalent per week  . Drug use: No    Comment: in college  . Sexual activity: Yes    Comment: erpr+/HER-2 neg.  Other Topics Concern  . None  Social History Narrative   Married to [place]   One dtr- 57 years old- [name]- in [place]   Patient has a Masters   Patient  has 1 child.    Additional Social History:                         Sleep: Good  Appetite:  Good  Current Medications: Current Facility-Administered Medications  Medication Dose Route Frequency Provider Last Rate Last Dose  . acetaminophen (TYLENOL) tablet 650 mg  650 mg Oral Q6H PRN Ethelene Hal, NP      . alum & mag hydroxide-simeth (MAALOX/MYLANTA) 200-200-20 MG/5ML suspension 30 mL  30 mL Oral Q4H PRN Ethelene Hal, NP      . hydrOXYzine (ATARAX/VISTARIL) tablet 25 mg  25 mg Oral TID PRN Ethelene Hal, NP   25 mg at 01/07/17 2335  . lisinopril (PRINIVIL,ZESTRIL) tablet 2.5 mg  2.5 mg Oral Daily Ethelene Hal, NP   2.5 mg at 01/12/17 0948  . magnesium hydroxide (MILK OF MAGNESIA) suspension 30 mL  30 mL Oral Daily PRN Ethelene Hal, NP      . Derrill Memo ON 01/17/2017] paliperidone (INVEGA SUSTENNA) injection 156 mg  156 mg Intramuscular Q30 days Maris Berger T, MD      . paliperidone (INVEGA) 24 hr tablet 6 mg  6 mg Oral Daily Pennelope Bracken, MD   6 mg at 01/12/17 0948  . traZODone (DESYREL) tablet 100 mg  100 mg Oral QHS PRN Ethelene Hal, NP        Lab Results: No results found for this or any previous visit (from the past 71  hour(s)).  Blood Alcohol level:  Lab Results  Component Value Date   ETH <10 11/04/2016   ETH <5 81/44/8185    Metabolic Disorder Labs: Lab Results  Component Value Date   HGBA1C 5.2 07/29/2016   MPG 103 07/29/2016   MPG 103 07/28/2016   Lab Results  Component Value Date   PROLACTIN 68.0 (H) 07/29/2016   PROLACTIN 44.6 (H) 07/28/2016   Lab Results  Component Value Date   CHOL 230 (H) 07/29/2016   TRIG 92 07/29/2016   HDL 100 07/29/2016   CHOLHDL 2.3 07/29/2016   VLDL 18 07/29/2016  LDLCALC 112 (H) 07/29/2016   LDLCALC 111 (H) 03/04/2013    Physical Findings: AIMS: Facial and Oral Movements Muscles of Facial Expression: None, normal Lips and Perioral Area: None, normal Jaw: None, normal Tongue: None, normal,Extremity Movements Upper (arms, wrists, hands, fingers): None, normal Lower (legs, knees, ankles, toes): None, normal, Trunk Movements Neck, shoulders, hips: None, normal, Overall Severity Severity of abnormal movements (highest score from questions above): None, normal Incapacitation due to abnormal movements: None, normal Patient's awareness of abnormal movements (rate only patient's report): No Awareness, Dental Status Current problems with teeth and/or dentures?: No Does patient usually wear dentures?: No  CIWA:    COWS:     Musculoskeletal: Strength & Muscle Tone: within normal limits Gait & Station: normal Patient leans: N/A  Psychiatric Specialty Exam: Physical Exam  Nursing note and vitals reviewed. Constitutional: She appears well-developed.  HENT:  Head: Normocephalic.  Cardiovascular: Normal rate.  Neurological: She is alert.  Psychiatric: She has a normal mood and affect.    Review of Systems  Psychiatric/Behavioral: Positive for hallucinations. Negative for suicidal ideas. The patient is not nervous/anxious.     Blood pressure 111/68, pulse 80, temperature (!) 97.5 F (36.4 C), resp. rate 20, height 5' 7.75" (1.721 m), weight 55.8  kg (123 lb).Body mass index is 18.84 kg/m.  General Appearance: Casual  Eye Contact:  Fair  Speech:  Clear and Coherent and Normal Rate  Volume:  Normal  Mood:  Euthymic  Affect:  Appropriate, Congruent and Constricted  Thought Process:  Coherent, Goal Directed and Descriptions of Associations: Loose  Orientation:  Full (Time, Place, and Person)  Thought Content:  Delusions and Hallucinations: Auditory  Suicidal Thoughts:  No  Homicidal Thoughts:  No  Memory:  Immediate;   Good Recent;   Good Remote;   Good  Judgement:  Impaired  Insight:  Lacking  Psychomotor Activity:  Normal  Concentration:  Concentration: Poor  Recall:  [place] of Knowledge:  Fair  Language:  Fair  Akathisia:  No  Handed:    AIMS (if indicated):     Assets:  Armed forces logistics/support/administrative officer Physical Health Resilience Social Support  ADL's:  Intact  Cognition:  WNL  Sleep:  Number of Hours: 4.25     Treatment Plan Summary: Daily contact with patient to assess and evaluate symptoms and progress in treatment and Medication management  Continue with current treatment plan on 01/12/2017 except where noted  - Bipolar I with psychotic features              - Continue Invega 92m po qDay             - Contiune Invega Sustenna 2339mIM once today with next dose of 15654mM on 01/17/17 +/- 4 days  - anxiety     - Continue atarax 54m63mD prn anxiety - HTN    - Continue lisinopril 2.5mg 37my - insomnia    - Continue trazodone 100mg 7mprn insomnia  -Encourage participation in groups and therapeutic milieu -Discharge planning will be ongoing    [facility]2/16/2018, 11:49 AM Agree with NP Progress Note

## 2017-01-13 DIAGNOSIS — F1010 Alcohol abuse, uncomplicated: Secondary | ICD-10-CM

## 2017-01-13 MED ORDER — PALIPERIDONE PALMITATE 156 MG/ML IM SUSP
156.0000 mg | INTRAMUSCULAR | Status: DC
  Administered 2017-01-13: 156 mg via INTRAMUSCULAR
  Filled 2017-01-13: qty 1

## 2017-01-13 NOTE — Plan of Care (Signed)
D: Pt, who prefers to be called Vanessa Mills, has been calm and compliant with medications and groups today. She has been visible in the milieu and active in groups. She denied SI, HI, and AVH. She reported good sleep, good appetite, normal energy level, and good concentration. She declined to go to lunch, even though she was hungry, stating that she would wait for dinner. She is pleasant but somewhat bizarre. She rated her depression, hopelessness, and anxiety levels all zero/10. She reports being eager to discharge.   A: Meds given as ordered. No PRNs requested or required. Q15 safety checks maintained. Support/encouragement offered.  R: Pt remains free from harm and continues with treatment. Will continue to monitor for needs/safety.

## 2017-01-13 NOTE — Progress Notes (Signed)
[facility] MD Progress Note  01/13/2017 12:53 PM Vanessa Mills  MRN:  [id number] Subjective:   Vanessa Mills is a 56 y/o F with history of Bipolar 1 who was admitted with worsening symptoms of mania and psychosis including disorganized behaviors, responding to internal stimuli, religious preoccupation, and AH. She was started on [place] and transitioned to [place], which she has tolerated without difficulty or side effect. She was monitored for improvement on the inpatient psychiatry unit.  Today upon evaluation, pt reports she is doing "wonderful." She denies SI/HI/AH/VH. She is sleeping well and her appetite is good. She is tolerating her current medications without difficulty or side effect. Pt shares that she particularly enjoyed the music recreation group over the weekend because she was able to sing some religious songs. She also had a good visit with her husband. Discussed with patient that she is doing well overall and likely would be safe to discharge from the hospital tomorrow, and pt was in agreement with that plan. She was in agreement to receive [place] booster injection today (about 4 days early) in anticipation of her discharge tomorrow. Pt had no further questions, comments, or concerns.   Principal Problem: Bipolar disorder, curr episode mixed, severe, with psychotic features ([place]) Diagnosis:   Patient Active Problem List   Diagnosis Date Noted  . Schizoaffective disorder, bipolar type ([place]) [F25.0] 01/07/2017  . Bipolar disorder, curr episode mixed, severe, with psychotic features ([place]) [F31.64] 07/31/2016  . Hot flash due to medication [R23.2, T50.905A] 09/20/2014  . History of long-term use of multiple prescription drugs [Z92.29] 05/20/2014  . Malignant neoplasm of overlapping sites of left breast in female, estrogen receptor positive ([place]) [U93.235, Z17.0] 03/18/2014  . DOE (dyspnea on exertion) [R06.09] 09/14/2013  . Abnormal EKG [R94.31] 09/14/2013   . Essential tremor [G25.0] 06/05/2012  . Abnormal involuntary movements(781.0) [R25.9] 06/05/2011  . 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 Bipolar disorder, most recent episode manic ([place]) [F31.10] 01/30/2011  . Chemotherapy follow-up examination [Z09] 12/28/2010  . Fatigue [R53.83] 12/28/2010   Total Time spent with patient: 30 minutes  Past Psychiatric History: see H&P  Past Medical History:  Past Medical History:  Diagnosis Date  . Bipolar 1 disorder ([place])   . Breast cancer ([place]) 03/07/2011  . Breast cancer, ILC, Left, receptor+, Her2- 10/10/2010  . Chemotherapy follow-up examination 12/28/2010  . Chronic mental illness   . Crohn's disease ([place])   . Fatigue 12/28/2010  . GERD (gastroesophageal reflux disease)    does not take medications for   . Headache(784.0)    takes midodrine for migraines prn  . Meniere's syndrome   . Mental disorder    bipolar, takes saphris at hs  . Movement disorder   . PONV (postoperative nausea and vomiting)   . Raynaud's disease   . S/P radiation therapy 05/15/11 - 07/01/11   Left Breast: 4500 cGy/25 fractions with Boost to Left chest Wall/Mastectomy Scar for Toal dose of 5940 cGy  . Status post chemotherapy    4 cycles AC  . Status post chemotherapy 01/31/11 - 04/18/11   Taxol x 12 weeks    Past Surgical History:  Procedure Laterality Date  . 2 orbital fracture surgeries    . ABDOMINAL HYSTERECTOMY     uterus removed only  . BLADDER SUSPENSION     done 2005  . BREAST SURGERY     Axillary dissection  . MASTECTOMY MODIFIED RADICAL  10/23/2010   Left Dr Margot Chimes  . PORT-A-CATH REMOVAL  08/21/2011  Procedure: REMOVAL PORT-A-CATH;  Surgeon: Haywood Lasso, MD;  Location: [facility];  Service: General;  Laterality: Right;  . PORTACATH PLACEMENT  11/28/2010   via right subclavian - Dr Margot Chimes  . SINUS EXPLORATION     Family History:  Family History  Problem Relation Age of Onset  . Hypertension Mother   . Diabetes  Mother   . Heart failure Father   . Cancer Maternal Aunt        breast, thyroid, colonm  . Heart attack Sister    Family Psychiatric  History: see H&P Social History:  Social History   Substance and Sexual Activity  Alcohol Use Yes  . Alcohol/week: 1.7 oz  . Types: 2 Glasses of wine, 1 Standard drinks or equivalent per week     Social History   Substance and Sexual Activity  Drug Use No   Comment: in college    Social History   Socioeconomic History  . Marital status: Married    Spouse name: Linna Hoff   . Number of children: 1  . Years of education: Masters  . Highest education level: None  Social Needs  . Financial resource strain: None  . Food insecurity - worry: None  . Food insecurity - inability: None  . Transportation needs - medical: None  . Transportation needs - non-medical: None  Occupational History  . Occupation: unemployed  Tobacco Use  . Smoking status: Never Smoker  . Smokeless tobacco: Never Used  Substance and Sexual Activity  . Alcohol use: Yes    Alcohol/week: 1.7 oz    Types: 2 Glasses of wine, 1 Standard drinks or equivalent per week  . Drug use: No    Comment: in college  . Sexual activity: Yes    Comment: erpr+/HER-2 neg.  Other Topics Concern  . None  Social History Narrative   Married to Paden   One dtr- 58 years old- [name]- in [place]   Patient has a Masters   Patient  has 1 child.    Additional Social History:                         Sleep: Fair  Appetite:  Good  Current Medications: Current Facility-Administered Medications  Medication Dose Route Frequency Provider Last Rate Last Dose  . acetaminophen (TYLENOL) tablet 650 mg  650 mg Oral Q6H PRN Ethelene Hal, NP      . alum & mag hydroxide-simeth (MAALOX/MYLANTA) 200-200-20 MG/5ML suspension 30 mL  30 mL Oral Q4H PRN Ethelene Hal, NP      . hydrOXYzine (ATARAX/VISTARIL) tablet 25 mg  25 mg Oral TID PRN Ethelene Hal, NP   25 mg at  01/07/17 2335  . lisinopril (PRINIVIL,ZESTRIL) tablet 2.5 mg  2.5 mg Oral Daily Ethelene Hal, NP   2.5 mg at 01/13/17 0802  . magnesium hydroxide (MILK OF MAGNESIA) suspension 30 mL  30 mL Oral Daily PRN Ethelene Hal, NP      . paliperidone (INVEGA SUSTENNA) injection 156 mg  156 mg Intramuscular Q30 days Maris Berger T, MD      . paliperidone (INVEGA) 24 hr tablet 6 mg  6 mg Oral Daily Pennelope Bracken, MD   6 mg at 01/13/17 9983  . traZODone (DESYREL) tablet 100 mg  100 mg Oral QHS PRN Ethelene Hal, NP        Lab Results: No results found for this or any previous visit (from  the past 48 hour(s)).  Blood Alcohol level:  Lab Results  Component Value Date   ETH <10 11/04/2016   ETH <5 69/48/5462    Metabolic Disorder Labs: Lab Results  Component Value Date   HGBA1C 5.2 07/29/2016   MPG 103 07/29/2016   MPG 103 07/28/2016   Lab Results  Component Value Date   PROLACTIN 68.0 (H) 07/29/2016   PROLACTIN 44.6 (H) 07/28/2016   Lab Results  Component Value Date   CHOL 230 (H) 07/29/2016   TRIG 92 07/29/2016   HDL 100 07/29/2016   CHOLHDL 2.3 07/29/2016   VLDL 18 07/29/2016   LDLCALC 112 (H) 07/29/2016   LDLCALC 111 (H) 03/04/2013    Physical Findings: AIMS: Facial and Oral Movements Muscles of Facial Expression: None, normal Lips and Perioral Area: None, normal Jaw: None, normal Tongue: None, normal,Extremity Movements Upper (arms, wrists, hands, fingers): None, normal Lower (legs, knees, ankles, toes): None, normal, Trunk Movements Neck, shoulders, hips: None, normal, Overall Severity Severity of abnormal movements (highest score from questions above): None, normal Incapacitation due to abnormal movements: None, normal Patient's awareness of abnormal movements (rate only patient's report): No Awareness, Dental Status Current problems with teeth and/or dentures?: No Does patient usually wear dentures?: No  CIWA:    COWS:      Musculoskeletal: Strength & Muscle Tone: within normal limits Gait & Station: normal Patient leans: N/A  Psychiatric Specialty Exam: Physical Exam  Nursing note and vitals reviewed.   Review of Systems  Constitutional: Negative for chills and fever.  Respiratory: Negative for cough and shortness of breath.   Cardiovascular: Negative for chest pain.  Gastrointestinal: Negative for heartburn and nausea.  Psychiatric/Behavioral: Negative for depression, hallucinations and suicidal ideas. The patient is not nervous/anxious.     Blood pressure 111/71, pulse 93, temperature 98.5 F (36.9 C), temperature source Oral, resp. rate 20, height 5' 7.75" (1.721 m), weight 55.8 kg (123 lb).Body mass index is 18.84 kg/m.  General Appearance: Neat and Well Groomed  Eye Contact:  Good  Speech:  Clear and Coherent and Normal Rate  Volume:  Normal  Mood:  Euthymic  Affect:  Appropriate and Congruent  Thought Process:  Coherent and Goal Directed  Orientation:  Full (Time, Place, and Person)  Thought Content:  Logical  Suicidal Thoughts:  No  Homicidal Thoughts:  No  Memory:  Immediate;   Good Recent;   Good Remote;   Good  Judgement:  Fair  Insight:  Fair  Psychomotor Activity:  Normal  Concentration:  Concentration: Good  Recall:  Good  Fund of Knowledge:  Good  Language:  Good  Akathisia:  No  Handed:    AIMS (if indicated):     Assets:  Armed forces logistics/support/administrative officer Physical Health Resilience Social Support  ADL's:  Intact  Cognition:  WNL  Sleep:  Number of Hours: 6     Treatment Plan Summary: Daily contact with patient to assess and evaluate symptoms and progress in treatment and Medication management. Pt has shown improvement of disorganization and psychotic symptoms with transition to [facility] and starting long-acting injectable form. She is tolerating her medications without difficulty or side effects. She is anticipating discharge to home tomorrow and we will give booster Invega  injection today.  - Continue inpatient hospitalization  - Bipolar I with psychotic features - Continue Invega 9m po qDay - Give ILorayne BenderSustenna 1564mIM q28 days (give today, first injection given on 01/10/17)  - anxiety                                     -  Continue atarax 66m TID prn anxiety - HTN                         - Continue lisinopril 2.59mqDay - insomnia                         - Continue trazodone 10035mhs prn insomnia  -Encourage participation in groups and therapeutic milieu -Discharge planning will be ongoing    ChrPennelope BrackenD 01/13/2017, 12:53 PM

## 2017-01-13 NOTE — Progress Notes (Signed)
Adult Psychoeducational Group Note  Date:  01/13/2017 Time:  9:34 PM  Group Topic/Focus:  Wrap-Up Group:   The focus of this group is to help patients review their daily goal of treatment and discuss progress on daily workbooks.  Participation Level:  Minimal  Participation Quality:  Appropriate  Affect:  Appropriate  Cognitive:  Appropriate  Insight: Limited  Engagement in Group:  Engaged  Modes of Intervention:  Socialization and Support  Additional Comments:  Patient attended and participated in group tonight. She reports that today was  special because the doctor told her she could go home tomorrow. She is planning to see a new doctor upon discharge.  Salley Scarlet [facility] 01/13/2017, 9:34 PM

## 2017-01-13 NOTE — Progress Notes (Signed)
Adult Psychoeducational Group Note  Date:  01/13/2017 Time:  2:44 AM  Group Topic/Focus:  Wrap-Up Group:   The focus of this group is to help patients review their daily goal of treatment and discuss progress on daily workbooks.  Participation Level:  Minimal  Participation Quality:  Appropriate  Affect:  Flat  Cognitive:  Oriented  Insight: Appropriate  Engagement in Group:  Engaged  Modes of Intervention:  Socialization and Support  Additional Comments:  Patient attended and participated in group tonight. She report that her day was good. I was the Lord's day so she read the bible, went for her meals and attended groups.  Salley Scarlet [facility] 01/13/2017, 2:44 AM

## 2017-01-13 NOTE — Progress Notes (Signed)
Pt received Kirt Boys injection pleasantly and without issue.

## 2017-01-13 NOTE — Progress Notes (Signed)
Recreation Therapy Notes  Date: 01/13/17 Time: 1000 Location: 500 Hall Dayroom  Group Topic: Coping Skills  Goal Area(s) Addresses:  Patient will be able to identify positive coping skills. Patient will be able to identify benefits of using coping skills. Patient will be able to identify benefits of using coping skills post d/c.  Behavioral Response: Engaged  Intervention: Mind map, pencils, white board, marker, eraser  Activity: Mind map.  Patients and LRT filled in the first eight boxes of the mind map together with depression, anxiety, hygiene, wellness, family, withdrawal, work and social interaction.  Patients were to then identify three coping skills for each of the situations identified.  The group would then reconvene and coping skills would be written on the board.   Education: Radiographer, therapeutic, Dentist.   Education Outcome: Acknowledges understanding/In group clarification offered/Needs additional education.   Clinical Observations/Feedback: Pt was bright and engaged.  Pt stated she uses prayer, exercise and music for everything.   Victorino Sparrow, LRT/CTRS     [name], Kealy Lewter A 01/13/2017 12:12 PM

## 2017-01-13 NOTE — BHH Group Notes (Signed)
LCSW Group Therapy Note   01/13/2017 1:15pm   Type of Therapy and Topic:  Group Therapy:  Overcoming Obstacles   Participation Level:  Active   Description of Group:    In this group patients will be encouraged to explore what they see as obstacles to their own wellness and recovery. They will be guided to discuss their thoughts, feelings, and behaviors related to these obstacles. The group will process together ways to cope with barriers, with attention given to specific choices patients can make. Each patient will be challenged to identify changes they are motivated to make in order to overcome their obstacles. This group will be process-oriented, with patients participating in exploration of their own experiences as well as giving and receiving support and challenge from other group members.   Therapeutic Goals: 1. Patient will identify personal and current obstacles as they relate to admission. 2. Patient will identify barriers that currently interfere with their wellness or overcoming obstacles.  3. Patient will identify feelings, thought process and behaviors related to these barriers. 4. Patient will identify two changes they are willing to make to overcome these obstacles:      Summary of Patient Progress   Stayed the entire time, engaged throughout.  Gave appropriate feedback to other group members.  Stated an obstacle she has overcome is losing self control.  Talked about how she reaches for her Bible when she feels unsettled, reads some of her favorite scriptures, and it calms and reassures her.   Therapeutic Modalities:   Cognitive Behavioral Therapy Solution Focused Therapy Motivational Interviewing Relapse Prevention Therapy  Trish Mage, LCSW 01/13/2017 4:22 PM

## 2017-01-13 NOTE — Tx Team (Signed)
Interdisciplinary Treatment and Diagnostic Plan Update  01/13/2017 Time of Session: 8:29 AM  Vanessa Mills MRN: [id number]  Principal Diagnosis: Bipolar disorder, curr episode mixed, severe, with psychotic features ([place])  Secondary Diagnoses: Principal Problem:   Bipolar disorder, curr episode mixed, severe, with psychotic features ([name]) Active Problems:   Schizoaffective disorder, bipolar type ([name])   Current Medications:  Current Facility-Administered Medications  Medication Dose Route Frequency Provider Last Rate Last Dose  . acetaminophen (TYLENOL) tablet 650 mg  650 mg Oral Q6H PRN Ethelene Hal, NP      . alum & mag hydroxide-simeth (MAALOX/MYLANTA) 200-200-20 MG/5ML suspension 30 mL  30 mL Oral Q4H PRN Ethelene Hal, NP      . hydrOXYzine (ATARAX/VISTARIL) tablet 25 mg  25 mg Oral TID PRN Ethelene Hal, NP   25 mg at 01/07/17 2335  . lisinopril (PRINIVIL,ZESTRIL) tablet 2.5 mg  2.5 mg Oral Daily Ethelene Hal, NP   2.5 mg at 01/13/17 0802  . magnesium hydroxide (MILK OF MAGNESIA) suspension 30 mL  30 mL Oral Daily PRN Ethelene Hal, NP      . Derrill Memo ON 01/17/2017] paliperidone (INVEGA SUSTENNA) injection 156 mg  156 mg Intramuscular Q30 days Maris Berger T, MD      . paliperidone (INVEGA) 24 hr tablet 6 mg  6 mg Oral Daily Pennelope Bracken, MD   6 mg at 01/13/17 6283  . traZODone (DESYREL) tablet 100 mg  100 mg Oral QHS PRN Ethelene Hal, NP        PTA Medications: Medications Prior to Admission  Medication Sig Dispense Refill Last Dose  . ARIPiprazole (ABILIFY) 10 MG tablet Take 1 tablet (10 mg total) by mouth daily. (Patient not taking: Reported on 01/05/2017) 14 tablet 0 Not Taking at Unknown time  . cholecalciferol (VITAMIN D) 1000 units tablet Take 1 tablet (1,000 Units total) by mouth daily. For bone health 1 tablet 0 Past Week at Unknown time    Patient Stressors: Other: patient denies stressors  - per chart, delusional  Patient Strengths: Average or above average intelligence Capable of independent living Communication skills General fund of knowledge Physical Health Supportive family/friends  Treatment Modalities: Medication Management, Group therapy, Case management,  1 to 1 session with clinician, Psychoeducation, Recreational therapy.   Physician Treatment Plan for Primary Diagnosis: Bipolar disorder, curr episode mixed, severe, with psychotic features ([name]) Long Term Goal(s): Improvement in symptoms so as ready for discharge  Short Term Goals: Ability to identify changes in lifestyle to reduce recurrence of condition will improve Ability to demonstrate self-control will improve Ability to identify and develop effective coping behaviors will improve Compliance with prescribed medications will improve Ability to identify triggers associated with substance abuse/mental health issues will improve  Medication Management: Evaluate patient's response, side effects, and tolerance of medication regimen.  Therapeutic Interventions: 1 to 1 sessions, Unit Group sessions and Medication administration.  Evaluation of Outcomes: Progressing  Physician Treatment Plan for Secondary Diagnosis: Principal Problem:   Bipolar disorder, curr episode mixed, severe, with psychotic features ([name]) Active Problems:   Schizoaffective disorder, bipolar type ([name])   Long Term Goal(s): Improvement in symptoms so as ready for discharge  Short Term Goals: Ability to identify changes in lifestyle to reduce recurrence of condition will improve Ability to demonstrate self-control will improve Ability to identify and develop effective coping behaviors will improve Compliance with prescribed medications will improve Ability to identify triggers associated with substance abuse/mental health issues will improve  Medication Management: Evaluate patient's response, side effects, and tolerance of  medication regimen.  Therapeutic Interventions: 1 to 1 sessions, Unit Group sessions and Medication administration.  Evaluation of Outcomes: Progressing   12/17: - Bipolar I with psychotic features - Continue Invega 66m po qDay - [place] Invega Sustenna 2361mIM once today with next dose of 15681mM on 01/17/17 +/- 4 days  - anxiety                                     - Continue atarax 59m75mD prn anxiety - HTN                         - Continue lisinopril 2.5mg 81my - insomnia                         - Continue trazodone 100mg 71mprn insomnia     RN Treatment Plan for Primary Diagnosis: Bipolar disorder, curr episode mixed, severe, with psychotic features ([facility]) [place] Term Goal(s): Knowledge of disease and therapeutic regimen to maintain health will improve  Short Term Goals: Ability to identify and develop effective coping behaviors will improve and Compliance with prescribed medications will improve  Medication Management: RN will administer medications as ordered by provider, will assess and evaluate patient's response and provide education to patient for prescribed medication. RN will report any adverse and/or side effects to prescribing provider.  Therapeutic Interventions: 1 on 1 counseling sessions, Psychoeducation, Medication administration, Evaluate responses to treatment, Monitor vital signs and CBGs as ordered, Perform/monitor CIWA, COWS, AIMS and Fall Risk screenings as ordered, Perform wound care treatments as ordered.  Evaluation of Outcomes: Progressing   LCSW Treatment Plan for Primary Diagnosis: Bipolar disorder, curr episode mixed, severe, with psychotic features ([facility]) [place] Goal(s): Safe transition to appropriate next level of care at discharge, Engage patient in therapeutic group addressing interpersonal concerns.  Short Term Goals: Engage patient in aftercare planning with referrals and resources  Therapeutic Interventions:  Assess for all discharge needs, 1 to 1 time with Social worker, Explore available resources and support systems, Assess for adequacy in community support network, Educate family and significant other(s) on suicide prevention, Complete Psychosocial Assessment, Interpersonal group therapy.  Evaluation of Outcomes: Met  Return home, follow up Neuropsych   Progress in Treatment: Attending groups: Yes Participating in groups: Yes Taking medication as prescribed: Yes Toleration medication: Yes, no side effects reported at this time Family/Significant other contact made: No Patient understands diagnosis: No  Limited insight Discussing patient identified problems/goals with staff: Yes Medical problems stabilized or resolved: Yes Denies suicidal/homicidal ideation: Yes Issues/concerns per patient self-inventory: None Other: N/A  New problem(s) identified: None identified at this time.   New Short Term/Long Term Goal(s): "I came in for my husband"   Discharge Plan or Barriers:   Reason for Continuation of Hospitalization:    Medication stabilization   Estimated Length of Stay: [place] d/c tomorrow, Wednesday at the latest  Attendees: Patient: Vanessa Mills/2018  8:29 AM  Physician: ChristMaris Berger2/17/2018  8:29 AM  Nursing: ElizabSena Hitch2/17/2018  8:29 AM  RN Care Manager: JennifLars Pinks2/17/2018  8:29 AM  Social Worker: Rod NoRipley Fraise/2018  8:29 AM  Recreational Therapist: MarjetWinfield Cunas/2018  8:29 AM  Other: DeloraNorberto Sorenson/2018  8:29 AM  Other:  01/13/2017  8:29 AM    Scribe for Treatment Team:  Roque Lias LCSW 01/13/2017 8:29 AM

## 2017-01-14 MED ORDER — LISINOPRIL 2.5 MG PO TABS: 2.5000 mg | ORAL_TABLET | Freq: Every day | ORAL | 0 refills | Status: DC

## 2017-01-14 MED ORDER — PALIPERIDONE ER 6 MG PO TB24: 6.0000 mg | ORAL_TABLET | Freq: Every day | ORAL | 0 refills | Status: DC

## 2017-01-14 MED ORDER — TRAZODONE HCL 100 MG PO TABS: 100.0000 mg | ORAL_TABLET | Freq: Every evening | ORAL | 0 refills | Status: DC | PRN

## 2017-01-14 MED ORDER — HYDROXYZINE HCL 25 MG PO TABS: 25.0000 mg | ORAL_TABLET | Freq: Three times a day (TID) | ORAL | 0 refills | Status: DC | PRN

## 2017-01-14 MED ORDER — PALIPERIDONE PALMITATE 156 MG/ML IM SUSP: 156.0000 mg | INTRAMUSCULAR | 0 refills | Status: DC

## 2017-01-14 NOTE — BHH Suicide Risk Assessment (Signed)
[facility] Discharge Suicide Risk Assessment   Principal Problem: Bipolar disorder, curr episode mixed, severe, with psychotic features [facility]) Discharge Diagnoses:  Patient Active Problem List   Diagnosis Date Noted  . Schizoaffective disorder, bipolar type ([place]) [F25.0] 01/07/2017  . Bipolar disorder, curr episode mixed, severe, with psychotic features ([place]) [F31.64] 07/31/2016  . Hot flash due to medication [R23.2, T50.905A] 09/20/2014  . History of long-term use of multiple prescription drugs [Z92.29] 05/20/2014  . Malignant neoplasm of overlapping sites of left breast in female, estrogen receptor positive ([place]) [G95.621, Z17.0] 03/18/2014  . DOE (dyspnea on exertion) [R06.09] 09/14/2013  . Abnormal EKG [R94.31] 09/14/2013  . Essential tremor [G25.0] 06/05/2012  . Abnormal involuntary movements(781.0) [R25.9] 06/05/2011  . 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 Bipolar disorder, most recent episode manic ([place]) [F31.10] 01/30/2011  . Chemotherapy follow-up examination [Z09] 12/28/2010  . Fatigue [R53.83] 12/28/2010    Total Time spent with patient: 30 minutes  Musculoskeletal: Strength & Muscle Tone: within normal limits Gait & Station: normal Patient leans: N/A  Psychiatric Specialty Exam: Review of Systems  Constitutional: Negative for chills and fever.  Respiratory: Negative for cough and shortness of breath.   Cardiovascular: Negative for chest pain.  Gastrointestinal: Negative for abdominal pain, heartburn, nausea and vomiting.  Psychiatric/Behavioral: Negative for depression, hallucinations and suicidal ideas. The patient is not nervous/anxious.     Blood pressure (!) 87/51, pulse 94, temperature 98.3 F (36.8 C), temperature source Oral, resp. rate 18, height 5' 7.75" (1.721 m), weight 55.8 kg (123 lb).Body mass index is 18.84 kg/m.  General Appearance: Casual and Fairly Groomed  Engineer, water::  Good  Speech:  Clear and Coherent and Normal Rate  Volume:  Normal  Mood:   Euthymic  Affect:  Appropriate and Congruent  Thought Process:  Coherent and Goal Directed  Orientation:  Full (Time, Place, and Person)  Thought Content:  Logical  Suicidal Thoughts:  No  Homicidal Thoughts:  No  Memory:  Immediate;   Good Recent;   Good Remote;   Good  Judgement:  Good  Insight:  Fair  Psychomotor Activity:  Normal  Concentration:  Good  Recall:  Good  Fund of Knowledge:Good  Language: Good  Akathisia:  No  Handed:    AIMS (if indicated):     Assets:  Communication Skills Leisure Time Physical Health Resilience Social Support  Sleep:  Number of Hours: 5  Cognition: WNL  ADL's:  Intact   Mental Status Per Nursing Assessment::   On Admission:  (denies all)  Demographic Factors:  Caucasian  Loss Factors: Financial problems/change in socioeconomic status  Historical Factors: NA  Risk Reduction Factors:   Sense of responsibility to family, Religious beliefs about death, Living with another person, especially a relative, Positive social support, Positive therapeutic relationship and Positive coping skills or problem solving skills  Continued Clinical Symptoms:  Bipolar Disorder:   Mixed State  Cognitive Features That Contribute To Risk:  None    Suicide Risk:  Minimal: No identifiable suicidal ideation.  Patients presenting with no risk factors but with morbid ruminations; may be classified as minimal risk based on the severity of the depressive symptoms  [facility] Follow up on 01/22/2017.   Why:  Wednesday at  [facility] with [place] M.  Dr A will be out of the country through the rest of this month. You will be able to see him after this first appointment Contact information: [place] [address]  [place] [address] [phone]         Subjective Data: Vanessa Mills is a 56 y/o F with history of Bipolar 1 who was admitted with worsening symptoms of mania and psychosis including  disorganized behaviors, responding to internal stimuli, religious preoccupation, and AH. She was started on [place] and transitioned to [place], which she has tolerated without difficulty or side effect. She was monitored for improvement on the inpatient psychiatry unit.  Today upon evaluation, pt reports she is doing well overall. She reports her mood is "great."  She denies SI/HI/AH/VH. She is sleeping well and her appetite is good. She is tolerating injection of [place] without difficulty or side effect. She asks about continuing the oral form of Invega, and we discussed that pt should continue oral Invega until her first follow up with her outpatient provider, and she was in agreement with that plan. She was able to engage in safety planning including plan to return to [facility] or contact emergency services if she feels unable to maintain her own safety. She had no further questions, comments, or concerns.    Plan Of Care/Follow-up recommendations:   -Discharge to outpatient level of care  - Bipolar I with psychotic features -ContinueInvega 63m po qDay until first outpatient follow up appointment -Continue ILorayne BenderSustenna 1541mIM q28 days (last given 01/13/17)  - anxiety - Continue atarax 2590mID prn anxiety - HTN - Continue lisinopril 2.5mg66may - insomnia - Continue trazodone 100mg52m prn insomnia  Activity:  as tolerated Diet:  normal Tests:  NA Other:  see above for DC plWelda12/18/2018, 9:25 AM

## 2017-01-14 NOTE — Progress Notes (Signed)
  [facility] Adult Case Management Discharge Plan :  Will you be returning to the same living situation after discharge:  Yes,  home At discharge, do you have transportation home?: Yes,  husband Do you have the ability to pay for your medications: Yes,  insurance  Release of information consent forms completed and in the chart;  Patient's signature needed at discharge.  Patient to Follow up at: Follow-up [place] Follow up on 01/22/2017.   Why:  Wednesday at  [facility] with [place] M.  Dr A will be out of the country through the rest of this month. You will be able to see him after this first appointment Contact information: [place] [address] [phone]           Next level of care provider has access to [place] and Suicide Prevention discussed: Yes,  yes  Have you used any form of tobacco in the last 30 days? (Cigarettes, Smokeless Tobacco, Cigars, and/or Pipes): No  Has patient been referred to the Quitline?: N/A patient is not a smoker  Patient has been referred for addiction treatment: [place], LCSW 01/14/2017, 9:55 AM

## 2017-01-14 NOTE — Discharge Summary (Signed)
Physician Discharge Summary Note  Patient:  Vanessa Mills is an 56 y.o., female MRN:  [id number] DOB:  April 11, 1960 Patient phone:  [phone] (home)  Patient address:   [address] [place] [address],   Total Time spent with patient: Greater than 30 minutes  Date of Admission:  01/07/2017 Date of Discharge: 01-14-17  Reason for Admission: Worsening psychosis, mania & disorganized behaviors.  Principal Problem: Bipolar disorder, curr episode mixed, severe, with psychotic features [facility])  Discharge Diagnoses: Patient Active Problem List   Diagnosis Date Noted  . Bipolar disorder, most recent episode manic ([place]) [F31.10] 01/30/2011    Priority: High  . Schizoaffective disorder, bipolar type ([place]) [F25.0] 01/07/2017  . Bipolar disorder, curr episode mixed, severe, with psychotic features ([place]) [F31.64] 07/31/2016  . Hot flash due to medication [R23.2, T50.905A] 09/20/2014  . History of long-term use of multiple prescription drugs [Z92.29] 05/20/2014  . Malignant neoplasm of overlapping sites of left breast in female, estrogen receptor positive ([place]) [E17.408, Z17.0] 03/18/2014  . DOE (dyspnea on exertion) [R06.09] 09/14/2013  . Abnormal EKG [R94.31] 09/14/2013  . Essential tremor [G25.0] 06/05/2012  . Abnormal involuntary movements(781.0) [R25.9] 06/05/2011  . 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 Chemotherapy follow-up examination [Z09] 12/28/2010  . Fatigue [R53.83] 12/28/2010   Past Psychiatric History: Bipolar disorder, mixed episode  Past Medical History:  Past Medical History:  Diagnosis Date  . Bipolar 1 disorder ([place])   . Breast cancer ([place]) 03/07/2011  . Breast cancer, ILC, Left, receptor+, Her2- 10/10/2010  . Chemotherapy follow-up examination 12/28/2010  . Chronic mental illness   . Crohn's disease ([place])   . Fatigue 12/28/2010  . GERD (gastroesophageal reflux disease)    does not take medications for   . Headache(784.0)    takes  midodrine for migraines prn  . Meniere's syndrome   . Mental disorder    bipolar, takes saphris at hs  . Movement disorder   . PONV (postoperative nausea and vomiting)   . Raynaud's disease   . S/P radiation therapy 05/15/11 - 07/01/11   Left Breast: 4500 cGy/25 fractions with Boost to Left chest Wall/Mastectomy Scar for Toal dose of 5940 cGy  . Status post chemotherapy    4 cycles AC  . Status post chemotherapy 01/31/11 - 04/18/11   Taxol x 12 weeks    Past Surgical History:  Procedure Laterality Date  . 2 orbital fracture surgeries    . ABDOMINAL HYSTERECTOMY     uterus removed only  . BLADDER SUSPENSION     done 2005  . BREAST SURGERY     Axillary dissection  . MASTECTOMY MODIFIED RADICAL  10/23/2010   Left Dr Margot Chimes  . PORT-A-CATH REMOVAL  08/21/2011   Procedure: REMOVAL PORT-A-CATH;  Surgeon: Haywood Lasso, MD;  Location: [place];  Service: General;  Laterality: Right;  . PORTACATH PLACEMENT  11/28/2010   via right subclavian - Dr Margot Chimes  . SINUS EXPLORATION     Family History:  Family History  Problem Relation Age of Onset  . Hypertension Mother   . Diabetes Mother   . Heart failure Father   . Cancer Maternal Aunt        breast, thyroid, colonm  . Heart attack Sister    Family Psychiatric  History: See H&P Social History:  Social History   Substance and Sexual Activity  Alcohol Use Yes  . Alcohol/week: 1.7 oz  . Types: 2 Glasses of wine, 1 Standard drinks or equivalent  per week     Social History   Substance and Sexual Activity  Drug Use No   Mills: in college    Social History   Socioeconomic History  . Marital status: Married    Spouse name: Vanessa Mills   . Number of children: 1  . Years of education: Masters  . Highest education level: None  Social Needs  . Financial resource strain: None  . Food insecurity - worry: None  . Food insecurity - inability: None  . Transportation needs - medical: None  . Transportation needs -  non-medical: None  Occupational History  . Occupation: unemployed  Tobacco Use  . Smoking status: Never Smoker  . Smokeless tobacco: Never Used  Substance and Sexual Activity  . Alcohol use: Yes    Alcohol/week: 1.7 oz    Types: 2 Glasses of wine, 1 Standard drinks or equivalent per week  . Drug use: No    Mills: in college  . Sexual activity: Yes    Mills: erpr+/HER-2 neg.  Other Topics Concern  . None  Social History Narrative   Married to [place]   One dtr- 58 years old- Vanessa Mills- in [place]   Patient has a Masters   Patient  has 1 child.    Hospital Course: Vanessa Mills is a 56 y/o F with history of Bipolar 1 who was admitted with worsening symptoms of mania and psychosisincludingdisorganized behaviors, responding to internal stimuli, religious preoccupation, and AH.She wasstarted on [place] and transitioned [place], which she has tolerated without difficulty or side effect. She was monitored for improvement on the inpatient psychiatry unit.  Vanessa Mills was admitted to the [facility] adult unit for crisis management due to worsening symptoms of Bipolar disorder with psychotic features. Patient has hx of Bipolar affective disorder. She was also known to be non-compliant to her treatment regimen. She is known in this hospital from previous hospitalization for mood stabilization treatments. She was in need of mood stabilization treatments.   After evaluation of her presenting symptoms, Vanessa Mills was started on the medication regimen targeting those presenting symptoms. Their indications & side effects were explained to her at the time of medication initiation. She received & was discharged on;  Hydroxyzine 25 mg prn for anxiety, Invega tablets 9 mg for mood control, Invega injectable 156 mg/ML Q monthly for mood control (Due on 02-13-16) & Trazodone 50 mg for insomnia. Her other pre-existing medical problems were identified & treated accordingly by resuming her  pertinent home medications as deemed appropriate.   During the course of her hosptalization, Vanessa Mills's improvement was monitored by observation & her daily report of symptom reduction noted.  Her emotional & mental status were monitored by daily self-inventory reports completed by her & the clinical staff. She reported continued improvement on daily basis & denied any new concerns. She was enrolled & encouraged to attend group seesions to help with recognizing triggers of her emotional crises & ways to cope better with them.         Vanessa Mills was evaluated by the treatment team on daily basis for mood stability and plans for continued recovery after discharge. She was offered further treatment options upon discharge on an outpatient basis as noted below. She was encouraged to maintain satisfactory support network and home environment as this will aid in maintaining mood stability. She was instructed & encouraged to adhere to her medication regimen as recommended by her treatment team.    Vanessa Mills was seen this morning by the attending psychiatrist.  Her reason for admission, treatment plans & response to treatment discussed. She endorsed that she is doing well & ready to be discharged to continue mental health care on an outpatient basis as noted below. She was provided with all the necessary information needed to make this appointment without problems. Upon discharge, Vanessa Mills was both mentally and medically stable denying suicidal/homicidal ideation, auditory/visual/tactile hallucinations, delusional thoughts and paranoia. She left [facility] with all personal belongings in no distress.  Transportation per his arrangement (husband).   Physical Findings: AIMS: Facial and Oral Movements Muscles of Facial Expression: None, normal Lips and Perioral Area: None, normal Jaw: None, normal Tongue: None, normal,Extremity Movements Upper (arms, wrists, hands, fingers): None, normal Lower (legs, knees, ankles,  toes): None, normal, Trunk Movements Neck, shoulders, hips: None, normal, Overall Severity Severity of abnormal movements (highest score from questions above): None, normal Incapacitation due to abnormal movements: None, normal Patient's awareness of abnormal movements (rate only patient's report): No Awareness, Dental Status Current problems with teeth and/or dentures?: No Does patient usually wear dentures?: No  CIWA:    COWS:     Musculoskeletal: Strength & Muscle Tone: within normal limits Gait & Station: normal Patient leans: N/A  Psychiatric Specialty Exam: Physical Exam  Constitutional: She appears well-developed.  HENT:  Head: Normocephalic.  Eyes: Pupils are equal, round, and reactive to light.  Neck: Normal range of motion.  Cardiovascular: Normal rate.  Respiratory: Effort normal.  GI: Soft.  Genitourinary:  Genitourinary Comments: Deferred  Musculoskeletal: Normal range of motion.  Neurological: She is alert.  Skin: Skin is warm.    Review of Systems  Constitutional: Negative.   HENT: Negative.   Eyes: Negative.   Respiratory: Negative.   Cardiovascular: Negative.   Gastrointestinal: Negative.   Genitourinary: Negative.   Musculoskeletal: Negative.   Skin: Negative.   Endo/Heme/Allergies: Negative.   Psychiatric/Behavioral: Positive for depression (Stable) and hallucinations (Hx. auditory hallucination). Negative for memory loss, substance abuse and suicidal ideas. The patient has insomnia (Stable). The patient is not nervous/anxious.     Blood pressure (!) 87/51, pulse 94, temperature 98.3 F (36.8 C), temperature source Oral, resp. rate 18, height 5' 7.75" (1.721 m), weight 55.8 kg (123 lb).Body mass index is 18.84 kg/m.  See Md's SRA   Have you used any form of tobacco in the last 30 days? (Cigarettes, Smokeless Tobacco, Cigars, and/or Pipes): No  Has this patient used any form of tobacco in the last 30 days? (Cigarettes, Smokeless Tobacco, Cigars,  and/or Pipes): No  Blood Alcohol level:  Lab Results  Component Value Date   ETH <10 11/04/2016   ETH <5 64/33/2951   Metabolic Disorder Labs:  Lab Results  Component Value Date   HGBA1C 5.2 07/29/2016   MPG 103 07/29/2016   MPG 103 07/28/2016   Lab Results  Component Value Date   PROLACTIN 68.0 (H) 07/29/2016   PROLACTIN 44.6 (H) 07/28/2016   Lab Results  Component Value Date   CHOL 230 (H) 07/29/2016   TRIG 92 07/29/2016   HDL 100 07/29/2016   CHOLHDL 2.3 07/29/2016   VLDL 18 07/29/2016   LDLCALC 112 (H) 07/29/2016   LDLCALC 111 (H) 03/04/2013   See Psychiatric Specialty Exam and Suicide Risk Assessment completed by Attending Physician prior to discharge.  Discharge destination:  Home  Is patient on multiple antipsychotic therapies at discharge:  No   Has Patient had three or more failed trials of antipsychotic monotherapy by history:  No  Recommended Plan for Multiple Antipsychotic Therapies:  NA  Allergies as of 01/14/2017   No Known Allergies     Medication List    STOP taking these medications   ARIPiprazole 10 MG tablet Commonly known as:  ABILIFY   cholecalciferol 1000 units tablet Commonly known as:  VITAMIN D     TAKE these medications     Indication  hydrOXYzine 25 MG tablet Commonly known as:  ATARAX/VISTARIL Take 1 tablet (25 mg total) by mouth 3 (three) times daily as needed for anxiety.  Indication:  Feeling Anxious   lisinopril 2.5 MG tablet Commonly known as:  PRINIVIL,ZESTRIL Take 1 tablet (2.5 mg total) by mouth daily. For high blood pressure Start taking on:  01/15/2017  Indication:  High Blood Pressure Disorder   paliperidone 156 MG/ML Susp injection Commonly known as:  INVEGA SUSTENNA Inject 1 mL (156 mg total) into the muscle every 30 (thirty) days. (Due on 02-13-16): For mood control Start taking on:  02/12/2017  Indication:  Mood control   paliperidone 6 MG 24 hr tablet Commonly known as:  INVEGA Take 1 tablet (6 mg  total) by mouth daily. For mood control Start taking on:  01/15/2017  Indication:  Mood control   traZODone 100 MG tablet Commonly known as:  DESYREL Take 1 tablet (100 mg total) by mouth at bedtime as needed for sleep.  Indication:  [place] Follow up on 01/22/2017.   Why:  Wednesday at  [facility] with [place] M.  Dr A will be out of the country through the rest of this month. You will be able to see him after this first appointment Contact information: [address] [address] [phone]          Follow-up recommendations: Activity:  As tolerated Diet: As recommended by your primary care doctor. Keep all scheduled follow-up appointments as recommended.    Comments: Patient is instructed prior to discharge to: Take all medications as prescribed by his/her mental healthcare provider. Report any adverse effects and or reactions from the medicines to his/her outpatient provider promptly. Patient has been instructed & cautioned: To not engage in alcohol and or illegal drug use while on prescription medicines. In the event of worsening symptoms, patient is instructed to call the crisis hotline, 911 and or go to the nearest ED for appropriate evaluation and treatment of symptoms. To follow-up with his/her primary care provider for your other medical issues, concerns and or health care needs.   Signed: Lindell Spar, NP, PMHNP, FNP-BC 01/14/2017, 11:04 AM   Patient seen, Suicide Assessment Completed.  Disposition Plan Reviewed   Vanessa Mills is a 56 y/o F with history of Bipolar 1 who was admitted with worsening symptoms of mania and psychosisincludingdisorganized behaviors, responding to internal stimuli, religious preoccupation, and AH.She wasstarted on [place] and transitioned [place], which she has tolerated without difficulty or side effect. She was monitored for  improvement on the inpatient psychiatry unit.  Today upon evaluation,pt reports she is doing well overall. She reports her mood is "great."  She denies SI/HI/AH/VH. She is sleeping well and her appetite is good. She is tolerating injection of [place] without difficulty or side effect. She asks about continuing the oral form of Invega, and we discussed that pt should continue oral Invega until her first follow up with her outpatient provider, and she was in agreement with that plan. She was able to engage in safety planning  including plan to return to [facility] or contact emergency services if she feels unable to maintain her own safety. She had no further questions, comments, or concerns.    Plan Of Care/Follow-up recommendations:   -Discharge to outpatient level of care  - Bipolar I with psychotic features -ContinueInvega 51m po qDay until first outpatient follow up appointment -Continue IKirt Boys159mIMq28 days (last given 01/13/17)  - anxiety - Continue atarax 2565mID prn anxiety - HTN - Continue lisinopril 2.5mg9may - insomnia - Continue trazodone 100mg75m prn insomnia  Activity:  as tolerated Diet:  normal Tests:  NA Other:  see above for DC plan  [place]

## 2017-01-14 NOTE — Plan of Care (Signed)
Pt was able to show improved communication at conclusion of self esteem, coping skills, wellness, team building and communication recreation therapy sessions.   Victorino Sparrow, LRT/CTRS

## 2017-01-14 NOTE — Progress Notes (Signed)
Recreation Therapy Notes  INPATIENT RECREATION TR PLAN  Patient Details Name: Vanessa Mills MRN: [id number] DOB: 04-Nov-1960 Today's Date: 01/14/2017  Rec Therapy Plan Is patient appropriate for Therapeutic Recreation?: Yes Treatment times per week: about 3 days Estimated Length of Stay: 5-7 days TR Treatment/Interventions: Group participation (Comment)  Discharge Criteria Pt will be discharged from therapy if:: Discharged Treatment plan/goals/alternatives discussed and agreed upon by:: Patient/family  Discharge Summary Short term goals set: Pt will be able to demonstrate improved communication at conclusion of recreation theapy sessions. Short term goals met: Complete Progress toward goals comments: Groups attended Which groups?: Self-esteem, Wellness, Communication, Coping skills, Other (Comment)(Team building) Reason goals not met: None Therapeutic equipment acquired: N/A Reason patient discharged from therapy: Discharge from hospital Pt/family agrees with progress & goals achieved: Yes Date patient discharged from therapy: 01/14/17   Victorino Sparrow, LRT/CTRS  [name], Julianne Chamberlin A 01/14/2017, 12:23 PM

## 2017-01-14 NOTE — Progress Notes (Signed)
Recreation Therapy Notes  Date: 01/14/17 Time: 0950 Location:  [address] Dayroom   Group Topic: Self-Esteem  Goal Area(s) Addresses:  Patient will successfully identify positive attributes about themselves.  Patient will successfully identify benefit of improved self-esteem.   Behavioral Response: Engaged  Intervention: Magazines, scissors, glue sticks, Architect paper  Activity: Collage.  Patients were to create a collage that highlighted things about them such as what they like, new things they would like to try, things that mean something to them, etc.  Education:  Self-Esteem, Discharge Planning.   Education Outcome: Acknowledges education/In group clarification offered/Needs additional education  Clinical Observations/Feedback: Pt arrived a little late.  Pt stated she liked the [employer], red wine, she's an all american girl, loves to cook, stated she was more than a wife to [place] and he was her husband.  Pt was bright and active.  Pt stated she enjoyed the activity.    Victorino Sparrow, LRT/CTRS    Victorino Sparrow A 01/14/2017 11:39 AM

## 2017-01-14 NOTE — Progress Notes (Signed)
Patient ID: Vanessa Mills, female   DOB: 1960-07-13, 56 y.o.   MRN: [id number] Patient discharged to home/self care.  Patient was excited to discharge and was happy to be home for the [place] holiday.  Patient denies SI, HI and AVH.  Patient was in a bright mood and accompanied by husband at discharge.  Patient acknowledged understanding of all discharge instructions and receipt of all personal belongings.

## 2017-02-24 ENCOUNTER — Encounter: Payer: Self-pay | Admitting: Oncology

## 2017-07-28 ENCOUNTER — Telehealth: Payer: Self-pay

## 2017-07-28 NOTE — Telephone Encounter (Signed)
Returned patient's call regarding concern for spot on leg.  No answer with return call, voicemail left to have patient return call at her convenience.

## 2017-12-09 ENCOUNTER — Ambulatory Visit: Payer: Self-pay | Admitting: Neurology

## 2018-11-16 ENCOUNTER — Telehealth: Payer: Self-pay | Admitting: Oncology

## 2018-11-16 NOTE — Telephone Encounter (Signed)
Returned patient's phone call regarding rescheduling an appointment, left a voicemail. 

## 2018-11-18 ENCOUNTER — Telehealth: Payer: Self-pay

## 2018-11-18 NOTE — Telephone Encounter (Signed)
RN returned call, voicemail left for patient to return call.

## 2018-11-19 ENCOUNTER — Telehealth: Payer: Self-pay | Admitting: Oncology

## 2018-11-19 ENCOUNTER — Encounter: Payer: Self-pay | Admitting: Oncology

## 2018-11-19 NOTE — Telephone Encounter (Signed)
Returned patient's phone call regarding voice mail that was left, left a voicemail.

## 2018-12-15 ENCOUNTER — Ambulatory Visit (HOSPITAL_COMMUNITY)
Admission: RE | Admit: 2018-12-15 | Discharge: 2018-12-15 | Disposition: A | Discharge status: Home / Self Care | Payer: Medicare Other | Attending: Psychiatry | Admitting: Psychiatry

## 2018-12-15 DIAGNOSIS — Z800 Family history of malignant neoplasm of digestive organs: Secondary | ICD-10-CM | POA: Diagnosis not present

## 2018-12-15 DIAGNOSIS — Z923 Personal history of irradiation: Secondary | ICD-10-CM | POA: Diagnosis not present

## 2018-12-15 DIAGNOSIS — Z915 Personal history of self-harm: Secondary | ICD-10-CM | POA: Insufficient documentation

## 2018-12-15 DIAGNOSIS — F319 Bipolar disorder, unspecified: Secondary | ICD-10-CM | POA: Diagnosis not present

## 2018-12-15 DIAGNOSIS — K5090 Crohn's disease, unspecified, without complications: Secondary | ICD-10-CM | POA: Diagnosis not present

## 2018-12-15 DIAGNOSIS — Z9012 Acquired absence of left breast and nipple: Secondary | ICD-10-CM | POA: Insufficient documentation

## 2018-12-15 DIAGNOSIS — F209 Schizophrenia, unspecified: Secondary | ICD-10-CM | POA: Diagnosis not present

## 2018-12-15 DIAGNOSIS — Z9221 Personal history of antineoplastic chemotherapy: Secondary | ICD-10-CM | POA: Diagnosis not present

## 2018-12-15 DIAGNOSIS — Z853 Personal history of malignant neoplasm of breast: Secondary | ICD-10-CM | POA: Diagnosis not present

## 2018-12-15 DIAGNOSIS — Z7289 Other problems related to lifestyle: Secondary | ICD-10-CM | POA: Diagnosis not present

## 2018-12-15 DIAGNOSIS — Z803 Family history of malignant neoplasm of breast: Secondary | ICD-10-CM | POA: Insufficient documentation

## 2018-12-15 DIAGNOSIS — Z833 Family history of diabetes mellitus: Secondary | ICD-10-CM | POA: Insufficient documentation

## 2018-12-15 DIAGNOSIS — F29 Unspecified psychosis not due to a substance or known physiological condition: Secondary | ICD-10-CM | POA: Diagnosis present

## 2018-12-15 DIAGNOSIS — Z79899 Other long term (current) drug therapy: Secondary | ICD-10-CM | POA: Insufficient documentation

## 2018-12-15 NOTE — BH Assessment (Addendum)
Assessment Note  Vanessa Mills is an 58 y.o. female presenting as a [facility] walk-in, brought in by her husband, for worsening auditory hallucinations. Patient denied SI and HI. Patient reported seeing Dr. Chucky May for medication management for schizophrenia. Patient reported onset of current episode was since 09/2018 when she [place] Patient reported hearing voices "telling me not to eat, not to pray and not to read bible". Patient has demonstrated erratic behaviors, [place]. On yesterday patient was going [place] drive [place] for [place] passport. Patient reported loosing control of these erratic behaviors. Patient reported having daily conversations with voices. Patient reported suicide attempt "long ago", unable to give timeframe. Per chart patient was last inpatient 12/2016 and 06/2016. Patient denied self-harming behaviors. Patient is currently being seen for medication management by Dr. RChucky May Patient reported taking Invega shot every 3 months and taking Depakote and Xanax.   Patient reported living alone with husband. Patient has 1 adult child. Patient reported getting along with family. Patient reported, "I get 3 hours of sleep, that's all I require and my appetite is good". Patient denied alcohol and drug usage.   Diagnosis: Psychosis disorder  Past Medical History:  Past Medical History:  Diagnosis Date  . Bipolar 1 disorder ([place]   . Breast cancer ([place] 03/07/2011  . Breast cancer, ILC, Left, receptor+, Her2- 10/10/2010  . Chemotherapy follow-up examination 12/28/2010  . Chronic mental illness   . Crohn's disease ([place]   . Fatigue 12/28/2010  . GERD (gastroesophageal reflux disease)    does not take medications for   . Headache(784.0)    takes midodrine for migraines prn  . Meniere's syndrome   . Mental disorder    bipolar, takes saphris at hs  . Movement disorder   . PONV  (postoperative nausea and vomiting)   . Raynaud's disease   . S/P radiation therapy 05/15/11 - 07/01/11   Left Breast: 4500 cGy/25 fractions with Boost to Left chest Wall/Mastectomy Scar for Toal dose of 5940 cGy  . Status post chemotherapy    4 cycles AC  . Status post chemotherapy 01/31/11 - 04/18/11   Taxol x 12 weeks    Past Surgical History:  Procedure Laterality Date  . 2 orbital fracture surgeries    . ABDOMINAL HYSTERECTOMY     uterus removed only  . BLADDER SUSPENSION     done 2005  . BREAST SURGERY     Axillary dissection  . MASTECTOMY MODIFIED RADICAL  10/23/2010   Left Dr SMargot Chimes . PORT-A-CATH REMOVAL  08/21/2011   Procedure: REMOVAL PORT-A-CATH;  Surgeon: CHaywood Lasso MD;  [place]  Service: General;  Laterality: Right;  . PORTACATH PLACEMENT  11/28/2010   via right subclavian - Dr SMargot Chimes . SINUS EXPLORATION      Family History:  Family History  Problem Relation Age of Onset  . Hypertension Mother   . Diabetes Mother   . Heart failure Father   . Cancer Maternal Aunt        breast, thyroid, colonm  . Heart attack Sister     Social History:  reports that she has never smoked. She has never used smokeless tobacco. She reports current alcohol use of about 3.0 standard drinks of alcohol per week. She reports that she does not use drugs.  Additional Social History:  Alcohol / Drug Use Pain Medications: see  MAR Prescriptions: see MAR Over the Counter: see MAR  CIWA: CIWA-Ar BP: (!) 155/100 Pulse Rate: (!) 108 COWS:    Allergies: No Known Allergies  Home Medications: (Not in a hospital admission)   OB/GYN Status:  No LMP recorded. Patient has had a hysterectomy.  General Assessment Data Location of Assessment: [facility] Assessment Services TTS Assessment: In system Is this a Tele or Face-to-Face Assessment?: Face-to-Face Is this an Initial Assessment or a Re-assessment for this encounter?: Initial Assessment Patient Accompanied  by:: Adult(Dan Nicki Reaper, husband) Permission Given to speak with another: Yes Name, Relationship and Phone Number: Gevena Barre, husband) Language Other than English: No Living Arrangements: (family home) What gender do you identify as?: Female Marital status: Married Living Arrangements: Spouse/significant other Can pt return to current living arrangement?: Yes Admission Status: Voluntary Is patient capable of signing voluntary admission?: Yes Referral Source: Self/Family/Friend     Crisis Care Plan Living Arrangements: Spouse/significant other Legal Guardian: (self) Name of Psychiatrist: (Dr. Chucky May) Name of Therapist: (none)  Education Status Is patient currently in school?: No Is the patient employed, unemployed or receiving disability?: Unemployed  Risk to self with the past 6 months Suicidal Ideation: No Has patient been a risk to self within the past 6 months prior to admission? : No Suicidal Intent: No Has patient had any suicidal intent within the past 6 months prior to admission? : No Is patient at risk for suicide?: No Suicidal Plan?: No Has patient had any suicidal plan within the past 6 months prior to admission? : No Access to Means: No What has been your use of drugs/alcohol within the last 12 months?: (none) Previous Attempts/Gestures: Yes How many times?: (1x many years ago) Other Self Harm Risks: (none) Triggers for Past Attempts: Unknown Intentional Self Injurious Behavior: None Family Suicide History: No Recent stressful life event(s): Other (Comment)(hallucinations) Persecutory voices/beliefs?: No Depression: No Depression Symptoms: (denied) Substance abuse history and/or treatment for substance abuse?: No Suicide prevention information given to non-admitted patients: Not applicable  Risk to Others within the past 6 months Homicidal Ideation: No Does patient have any lifetime risk of violence toward others beyond the six months prior to  admission? : No Thoughts of Harm to Others: No Current Homicidal Intent: No Current Homicidal Plan: No Access to Homicidal Means: No History of harm to others?: No Assessment of Violence: None Noted Violent Behavior Description: (none reported) Does patient have access to weapons?: No Criminal Charges Pending?: No Does patient have a court date: No Is patient on probation?: No  Psychosis Hallucinations: Auditory Delusions: None noted  Mental Status Report Appearance/Hygiene: Unremarkable Eye Contact: Good Motor Activity: Freedom of movement Speech: Logical/coherent Level of Consciousness: Alert Mood: Pleasant, Anxious Affect: Anxious, Appropriate to circumstance Anxiety Level: Moderate Thought Processes: Coherent, Relevant Judgement: Partial Orientation: Person, Place, Time, Situation Obsessive Compulsive Thoughts/Behaviors: None  Cognitive Functioning Concentration: Normal Memory: Recent Intact Is patient IDD: No Insight: Fair Impulse Control: Poor Appetite: Good Have you had any weight changes? : No Change Sleep: No Change Total Hours of Sleep: (3) Vegetative Symptoms: None  ADLScreening [facility] Assessment Services) Patient's cognitive ability adequate to safely complete daily activities?: Yes Patient able to express need for assistance with ADLs?: Yes Independently performs ADLs?: Yes (appropriate for developmental age)  Prior Inpatient Therapy Prior Inpatient Therapy: Yes Prior Therapy Dates: (2018) Prior Therapy Facilty/Provider(s): ([facility]) Reason for Treatment: (schizophrenia)  Prior Outpatient Therapy Prior Outpatient Therapy: No Does patient have an ACCT team?: No Does patient have Intensive In-House  Services?  : No Does patient have [facility] services? : No Does patient have P4CC services?: No  ADL Screening (condition at time of admission) Patient's cognitive ability adequate to safely complete daily activities?: Yes Patient able to express need  for assistance with ADLs?: Yes Independently performs ADLs?: Yes (appropriate for developmental age)  Disposition:  Disposition Initial Assessment Completed for this Encounter: Yes Disposition of Patient: Admit  Talbot Grumbling, NP, patient meets inpatient criteria. Per [name], patient accepted to [facility].  On Site Evaluation by:   Reviewed with Physician:    Venora Maples 12/15/2018 8:27 PM
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
## 2019-03-16 ENCOUNTER — Institutional Professional Consult (permissible substitution): Payer: Medicare Other | Admitting: Plastic Surgery

## 2019-04-23 ENCOUNTER — Institutional Professional Consult (permissible substitution): Payer: Self-pay | Admitting: Plastic Surgery

## 2019-09-30 ENCOUNTER — Encounter: Payer: Self-pay | Admitting: Neurology

## 2019-10-05 NOTE — Progress Notes (Signed)
Assessment/Plan:   1.  Cervical dystonia  -Muscles involved primarily on the right sternocleidomastoid, left splenius capitis, left levator scapulae.  Patient has tried Botox in the past with [facility] neurology.  She is interested in trying again.  Discussed extensively risk, benefits, and side effects, including black box warning.  Discussed risk of dysphagia.  She would like to try again.  We will try to get insurance prior authorization and go from there  -She asked me for a prescription for Xanax XR.  Discussed with the patient that we do not prescribe this medication in our office.  She is already on Xanax from another physician.  This also would not be standard of care treatment for cervical dystonia.  2.  Essential tremor  -Tremor in her hands is more characteristic with essential tremor.  Have given her primidone in the past.  She did not come back after she took that.  Is unclear how she did on that medication.   Subjective:   Vanessa Mills was seen in consultation in the movement disorder clinic at the request of Maurice Small, MD.  The evaluation is for tremor and cervical dystonia.  I have not seen the patient for over 4 years.  For the purpose of review, the patient does have a very long history of tremor.  She has been seen by several neurologist.  She was seen by Dr. Doy Mince at [facility] neurology in 2004 for essential tremor and cervical dystonia.  She was on propranolol and fairly high-dose Xanax for the symptoms.  She received Botox from 2006-2012, but discontinued it in 2012 as she did not think that it was particularly helpful.  When I last saw her, she was on primidone, 50 mg twice per day.  States that the symptoms of cervical dystonia are "bothering me."  The head is staying turned and "it shakes."  The L ear wants to go to the L shoulder.     Current/Previously tried tremor medications: Xanax - currently on 2 mg bid per PDMP  gabapentin; propranolol, 40 mg twice per  day (stopped because of bradycardia, but was bradycardic even off of medication) primidone-50 mg twice per day (this was given, but is unclear if she took it)  Current medications that may exacerbate tremor:  Lorayne Bender (Saphris in the past)  Outside reports reviewed: historical medical records, office notes and referral letter/letters.  Patient does have history of cervical dystonia, and when I saw her last visit we did discuss retrying Botox, but she opted to hold it.    No Known Allergies  Current Outpatient Medications  Medication Instructions  . alprazolam (XANAX) 2 mg, Oral, 2 times daily PRN  . lisinopril (ZESTRIL) 40 mg, Oral, Daily  . Paliperidone Palmitate ER (INVEGA TRINZA) 546 MG/1.75ML SUSY 546 mLs, Intramuscular, Every 3 months  . traZODone (DESYREL) 100 mg, Oral, At bedtime PRN  . zolpidem (AMBIEN) 10 mg, Oral, At bedtime PRN     Objective:   VITALS:   Vitals:   10/07/19 0954  BP: (!) 165/93  Pulse: 68  SpO2: 95%  Weight: 175 lb (79.4 kg)  Height: 5' 8"  (1.727 m)   Gen:  Appears stated age and in NAD. HEENT:  Normocephalic, atraumatic. The mucous membranes are moist. The superficial temporal arteries are without ropiness or tenderness. Cardiovascular: Regular rate and rhythm. Lungs: Clear to auscultation bilaterally. Neck: There are no carotid bruits noted bilaterally.  There is mild hypertrophy of the R SCM.  L ear slightly approximates the  L shoulder.  No significant tremor of the neck.    NEUROLOGICAL:  Orientation:  The patient is alert and oriented x 3.   Cranial nerves: There is good facial symmetry. Extraocular muscles are intact and visual fields are full to confrontational testing. Speech is fluent and clear. Soft palate rises symmetrically and there is no tongue deviation. Hearing is intact to conversational tone. Tone: Tone is good throughout. Sensation: Sensation is intact to light touch touch throughout  Coordination:  The patient has no  dysdiadichokinesia or dysmetria. Motor: Strength is 5/5 in the bilateral upper and lower extremities.  Shoulder shrug is equal bilaterally.  There is no pronator drift.  There are no fasciculations noted. Gait and Station: The patient is able to ambulate without difficulty.   MOVEMENT EXAM: Tremor:  There is mild tremor of the outstretched hands that slightly increases with intention.  I have reviewed and interpreted the following labs independently   Chemistry      Component Value Date/Time   NA 141 01/05/2017 1330   NA 141 07/08/2016 1029   K 4.6 01/05/2017 1330   K 4.1 07/08/2016 1029   CL 105 01/05/2017 1330   CL 104 03/05/2012 1005   CO2 28 01/05/2017 1330   CO2 28 07/08/2016 1029   BUN 11 01/05/2017 1330   BUN 14.9 07/08/2016 1029   CREATININE 0.62 01/05/2017 1330   CREATININE 0.8 07/08/2016 1029      Component Value Date/Time   CALCIUM 9.3 01/05/2017 1330   CALCIUM 9.8 07/08/2016 1029   ALKPHOS 92 11/04/2016 1556   ALKPHOS 102 07/08/2016 1029   AST 29 11/04/2016 1556   AST 37 (H) 07/08/2016 1029   ALT 23 11/04/2016 1556   ALT 27 07/08/2016 1029   BILITOT 0.5 11/04/2016 1556   BILITOT 0.69 07/08/2016 1029      Lab Results  Component Value Date   WBC 7.1 01/05/2017   HGB 13.7 01/05/2017   HCT 42.1 01/05/2017   MCV 96.1 01/05/2017   PLT 302 01/05/2017   Lab Results  Component Value Date   TSH 1.484 07/28/2016      Total time spent on today's visit was 45 minutes, including both face-to-face time and nonface-to-face time.  Time included that spent on review of records (prior notes available to me/labs/imaging if pertinent), discussing treatment and goals, answering patient's questions and coordinating care.  CC:  Maurice Small, MD

## 2019-10-07 ENCOUNTER — Other Ambulatory Visit: Payer: Self-pay

## 2019-10-07 ENCOUNTER — Encounter: Payer: Self-pay | Admitting: Neurology

## 2019-10-07 ENCOUNTER — Ambulatory Visit: Payer: Medicare PPO | Admitting: Neurology

## 2019-10-07 VITALS — BP 165/93 | HR 68 | Ht 68.0 in | Wt 175.0 lb

## 2019-10-07 DIAGNOSIS — G243 Spasmodic torticollis: Secondary | ICD-10-CM

## 2019-10-07 DIAGNOSIS — G250 Essential tremor: Secondary | ICD-10-CM | POA: Diagnosis not present

## 2019-11-23 ENCOUNTER — Encounter: Payer: Self-pay | Admitting: Oncology

## 2020-02-03 ENCOUNTER — Encounter: Payer: Self-pay | Admitting: Neurology

## 2020-02-03 ENCOUNTER — Telehealth: Payer: Self-pay | Admitting: Neurology

## 2020-02-03 NOTE — Telephone Encounter (Signed)
See patients September note.  I am not sure what happened or why she wasn't scheduled for botox.  Any ideas?

## 2020-02-03 NOTE — Telephone Encounter (Signed)
I was never told to start the process for her Botox. I will get started on it now and schedule her as soon as I get the approval. I don't ever go into the patient's chart to see if I need to start Botox usually someone just sends me a message to start it. And I guess they didn't schedule it at check-out. Not sure. I called patient and let her know I will get started on this for her and gave her the process info. I will call her back once I can verify insurance benefits to schedule her. Thanks!

## 2020-02-03 NOTE — Telephone Encounter (Signed)
Patient called in stating Dr. Carles Collet had mentioned getting her set up to do Botox. She would like to know what all is needed to be done to get that put into place?

## 2020-02-03 NOTE — Progress Notes (Addendum)
1/6- submitted Botox BV on [facility] per Tat.   1/28- received BV back; SP is [employer] SP and this does require PA. [employer] sent PA forms to complete and fax back to them so they could submit to insurance. I faxed all info back same day. Called [employer] and set up account/ gave verbal script.    300 units of Botox for Cervical Dystonia G24.3.

## 2020-02-22 NOTE — Telephone Encounter (Signed)
Received message from Carmell [place] that patient wanted me to call her about her botox. She said number on account was correct but every time I call its says that the number is not in service. Awaiting patient to call so we can schedule her Botox appt for March as I need to set up her specialty pharmacy account.

## 2020-02-28 NOTE — Progress Notes (Signed)
Received PA approval for the Botox through [place] valid until 01/27/21.

## 2020-03-20 NOTE — Progress Notes (Signed)
Called SP to set up delivery for the Botox and they said they spoke with patient and she did not want to order at this time. She wanted to hold off. I called and LMOM for patient letting her know we spoke with SP and received message that she wanted to hold off so we would cancel her appt and she can call back to R/S when she is ready.

## 2020-03-31 ENCOUNTER — Ambulatory Visit: Payer: Medicare PPO | Admitting: Neurology

## 2020-05-08 DIAGNOSIS — D485 Neoplasm of uncertain behavior of skin: Secondary | ICD-10-CM | POA: Diagnosis not present

## 2020-05-08 DIAGNOSIS — H61002 Unspecified perichondritis of left external ear: Secondary | ICD-10-CM | POA: Diagnosis not present

## 2020-06-01 DIAGNOSIS — H61002 Unspecified perichondritis of left external ear: Secondary | ICD-10-CM | POA: Diagnosis not present

## 2020-06-21 ENCOUNTER — Telehealth: Payer: Self-pay

## 2020-06-21 NOTE — Telephone Encounter (Signed)
We will go ahead and cancel that appt

## 2020-06-21 NOTE — Telephone Encounter (Signed)
Pt called and advised to follow up with her insurance to get her paperwork fixed so that she can get back on schedule with her botox pt verbalized understanding and that she will call them to work on it, pt also informed that we will DC her appointment,

## 2020-06-21 NOTE — Telephone Encounter (Signed)
Pt called she stated that insurance was not upfront with the price of her botox and that she would like to DC her appointment on 06/30/20, unless Dr Tat could buy the botox and she could pay it back pt advised that I was unsure if she could do that or not I would have to find that information out for her and then call her back. Pt verbalized understanding and is waiting for a return call

## 2020-06-30 ENCOUNTER — Ambulatory Visit: Payer: Medicare PPO | Admitting: Neurology

## 2020-07-07 DIAGNOSIS — H61002 Unspecified perichondritis of left external ear: Secondary | ICD-10-CM | POA: Diagnosis not present

## 2020-08-31 DIAGNOSIS — H61002 Unspecified perichondritis of left external ear: Secondary | ICD-10-CM | POA: Diagnosis not present

## 2020-09-01 DIAGNOSIS — U071 COVID-19: Secondary | ICD-10-CM | POA: Diagnosis not present

## 2020-09-13 DIAGNOSIS — D3131 Benign neoplasm of right choroid: Secondary | ICD-10-CM | POA: Diagnosis not present

## 2020-09-13 DIAGNOSIS — D3132 Benign neoplasm of left choroid: Secondary | ICD-10-CM | POA: Diagnosis not present

## 2020-09-13 DIAGNOSIS — H02834 Dermatochalasis of left upper eyelid: Secondary | ICD-10-CM | POA: Diagnosis not present

## 2020-09-13 DIAGNOSIS — H04123 Dry eye syndrome of bilateral lacrimal glands: Secondary | ICD-10-CM | POA: Diagnosis not present

## 2020-09-13 DIAGNOSIS — H02831 Dermatochalasis of right upper eyelid: Secondary | ICD-10-CM | POA: Diagnosis not present

## 2020-09-13 DIAGNOSIS — H2513 Age-related nuclear cataract, bilateral: Secondary | ICD-10-CM | POA: Diagnosis not present

## 2020-09-13 DIAGNOSIS — H10413 Chronic giant papillary conjunctivitis, bilateral: Secondary | ICD-10-CM | POA: Diagnosis not present

## 2020-10-05 DIAGNOSIS — L821 Other seborrheic keratosis: Secondary | ICD-10-CM | POA: Diagnosis not present

## 2020-10-05 DIAGNOSIS — H61002 Unspecified perichondritis of left external ear: Secondary | ICD-10-CM | POA: Diagnosis not present

## 2020-11-02 DIAGNOSIS — H61002 Unspecified perichondritis of left external ear: Secondary | ICD-10-CM | POA: Diagnosis not present

## 2020-11-02 DIAGNOSIS — Z23 Encounter for immunization: Secondary | ICD-10-CM | POA: Diagnosis not present

## 2020-11-28 ENCOUNTER — Encounter: Payer: Self-pay | Admitting: Oncology

## 2020-11-28 DIAGNOSIS — Z1231 Encounter for screening mammogram for malignant neoplasm of breast: Secondary | ICD-10-CM | POA: Diagnosis not present

## 2020-12-01 ENCOUNTER — Telehealth: Payer: Self-pay | Admitting: *Deleted

## 2020-12-01 DIAGNOSIS — G43909 Migraine, unspecified, not intractable, without status migrainosus: Secondary | ICD-10-CM | POA: Diagnosis not present

## 2020-12-01 DIAGNOSIS — G4700 Insomnia, unspecified: Secondary | ICD-10-CM | POA: Diagnosis not present

## 2020-12-01 NOTE — Telephone Encounter (Signed)
This RN spoke with pt per her call stating she had her normal screening right mammogram early this week and has been " called and told I need to come back in to speak with the radiologist " " The last time that happened is when I was diagnosed with breast cancer " " Can Dr Jana Hakim go ahead and order an U/S so if they need to the can do that "  This RN validated her concerns- as well as [facility] has standing orders for further work up if area of concern warrants it.  Note pt was released from care in 2018 post 5 years of follow up.  This RN reassured her of plan with verbal support of her concern.  No further needs at this time.

## 2020-12-06 ENCOUNTER — Telehealth: Payer: Self-pay

## 2020-12-06 NOTE — Telephone Encounter (Signed)
Pt called and LVM with concerns about abnormal mammo. Attempted to return pt's cal, LVM for pt to return call to [facility].

## 2020-12-11 ENCOUNTER — Telehealth: Payer: Self-pay

## 2020-12-11 NOTE — Telephone Encounter (Signed)
Attempted to return pt call, VM left with this attempt to reach pt.  Pt has questions about her mammogram (past results, and future appointments) with [facility]

## 2020-12-12 ENCOUNTER — Other Ambulatory Visit: Payer: Self-pay | Admitting: *Deleted

## 2020-12-12 ENCOUNTER — Telehealth: Payer: Self-pay | Admitting: *Deleted

## 2020-12-12 DIAGNOSIS — R928 Other abnormal and inconclusive findings on diagnostic imaging of breast: Secondary | ICD-10-CM

## 2020-12-12 NOTE — Telephone Encounter (Signed)
This RN contacted [facility] - verified pt is scheduled for return follow up of abnormal R mammo on 12/15/2020- but was also informed that they have note received orders from Dr Luan Pulling.  This RN asked about standing orders- and was told they do not have them for this patient.  This RN placed order for dx R mammo and U/S with bx of right breast ( if needed ).  This RN attempted to reach pt- obtained identified VM- detailed message left per above- including orders faxed.  This RN's name and return call number given.

## 2020-12-13 DIAGNOSIS — H61002 Unspecified perichondritis of left external ear: Secondary | ICD-10-CM | POA: Diagnosis not present

## 2020-12-13 DIAGNOSIS — Z23 Encounter for immunization: Secondary | ICD-10-CM | POA: Diagnosis not present

## 2020-12-15 ENCOUNTER — Encounter: Payer: Self-pay | Admitting: Oncology

## 2020-12-15 DIAGNOSIS — R922 Inconclusive mammogram: Secondary | ICD-10-CM | POA: Diagnosis not present

## 2020-12-15 DIAGNOSIS — R928 Other abnormal and inconclusive findings on diagnostic imaging of breast: Secondary | ICD-10-CM | POA: Diagnosis not present

## 2021-02-07 DIAGNOSIS — Z23 Encounter for immunization: Secondary | ICD-10-CM | POA: Diagnosis not present

## 2021-02-07 DIAGNOSIS — H61002 Unspecified perichondritis of left external ear: Secondary | ICD-10-CM | POA: Diagnosis not present

## 2021-03-27 DIAGNOSIS — E221 Hyperprolactinemia: Secondary | ICD-10-CM | POA: Diagnosis not present

## 2021-04-02 DIAGNOSIS — H61002 Unspecified perichondritis of left external ear: Secondary | ICD-10-CM | POA: Diagnosis not present

## 2021-04-03 DIAGNOSIS — N390 Urinary tract infection, site not specified: Secondary | ICD-10-CM | POA: Diagnosis not present

## 2021-04-03 DIAGNOSIS — F319 Bipolar disorder, unspecified: Secondary | ICD-10-CM | POA: Diagnosis not present

## 2021-04-03 DIAGNOSIS — I10 Essential (primary) hypertension: Secondary | ICD-10-CM | POA: Diagnosis not present

## 2021-04-17 DIAGNOSIS — F250 Schizoaffective disorder, bipolar type: Secondary | ICD-10-CM | POA: Diagnosis not present

## 2021-04-19 DIAGNOSIS — N6453 Retraction of nipple: Secondary | ICD-10-CM | POA: Diagnosis not present

## 2021-04-19 DIAGNOSIS — R928 Other abnormal and inconclusive findings on diagnostic imaging of breast: Secondary | ICD-10-CM | POA: Diagnosis not present

## 2021-05-02 DIAGNOSIS — F250 Schizoaffective disorder, bipolar type: Secondary | ICD-10-CM | POA: Diagnosis not present

## 2021-05-11 DIAGNOSIS — F250 Schizoaffective disorder, bipolar type: Secondary | ICD-10-CM | POA: Diagnosis not present

## 2021-06-08 DIAGNOSIS — F250 Schizoaffective disorder, bipolar type: Secondary | ICD-10-CM | POA: Diagnosis not present

## 2021-06-13 DIAGNOSIS — F209 Schizophrenia, unspecified: Secondary | ICD-10-CM | POA: Diagnosis not present

## 2021-06-13 DIAGNOSIS — G43909 Migraine, unspecified, not intractable, without status migrainosus: Secondary | ICD-10-CM | POA: Diagnosis not present

## 2021-06-13 DIAGNOSIS — I10 Essential (primary) hypertension: Secondary | ICD-10-CM | POA: Diagnosis not present

## 2021-06-13 DIAGNOSIS — G4700 Insomnia, unspecified: Secondary | ICD-10-CM | POA: Diagnosis not present

## 2021-06-13 DIAGNOSIS — E785 Hyperlipidemia, unspecified: Secondary | ICD-10-CM | POA: Diagnosis not present

## 2021-06-14 DIAGNOSIS — H61002 Unspecified perichondritis of left external ear: Secondary | ICD-10-CM | POA: Diagnosis not present

## 2021-07-19 DIAGNOSIS — M8580 Other specified disorders of bone density and structure, unspecified site: Secondary | ICD-10-CM | POA: Diagnosis not present

## 2021-07-19 DIAGNOSIS — F209 Schizophrenia, unspecified: Secondary | ICD-10-CM | POA: Diagnosis not present

## 2021-07-19 DIAGNOSIS — K5010 Crohn's disease of large intestine without complications: Secondary | ICD-10-CM | POA: Diagnosis not present

## 2021-07-19 DIAGNOSIS — E785 Hyperlipidemia, unspecified: Secondary | ICD-10-CM | POA: Diagnosis not present

## 2021-07-19 DIAGNOSIS — G43909 Migraine, unspecified, not intractable, without status migrainosus: Secondary | ICD-10-CM | POA: Diagnosis not present

## 2021-07-19 DIAGNOSIS — Z0000 Encounter for general adult medical examination without abnormal findings: Secondary | ICD-10-CM | POA: Diagnosis not present

## 2021-07-19 DIAGNOSIS — G4700 Insomnia, unspecified: Secondary | ICD-10-CM | POA: Diagnosis not present

## 2021-07-19 DIAGNOSIS — I10 Essential (primary) hypertension: Secondary | ICD-10-CM | POA: Diagnosis not present

## 2021-07-19 DIAGNOSIS — D696 Thrombocytopenia, unspecified: Secondary | ICD-10-CM | POA: Diagnosis not present

## 2021-07-20 DIAGNOSIS — E785 Hyperlipidemia, unspecified: Secondary | ICD-10-CM | POA: Diagnosis not present

## 2021-07-20 DIAGNOSIS — I10 Essential (primary) hypertension: Secondary | ICD-10-CM | POA: Diagnosis not present

## 2021-08-28 DIAGNOSIS — M8589 Other specified disorders of bone density and structure, multiple sites: Secondary | ICD-10-CM | POA: Diagnosis not present

## 2021-08-31 DIAGNOSIS — F250 Schizoaffective disorder, bipolar type: Secondary | ICD-10-CM | POA: Diagnosis not present

## 2021-09-03 DIAGNOSIS — B353 Tinea pedis: Secondary | ICD-10-CM | POA: Diagnosis not present

## 2021-09-03 DIAGNOSIS — B351 Tinea unguium: Secondary | ICD-10-CM | POA: Diagnosis not present

## 2021-09-03 DIAGNOSIS — Z79899 Other long term (current) drug therapy: Secondary | ICD-10-CM | POA: Diagnosis not present

## 2021-09-24 DIAGNOSIS — H61002 Unspecified perichondritis of left external ear: Secondary | ICD-10-CM | POA: Diagnosis not present

## 2021-09-24 DIAGNOSIS — B351 Tinea unguium: Secondary | ICD-10-CM | POA: Diagnosis not present

## 2021-09-24 DIAGNOSIS — L821 Other seborrheic keratosis: Secondary | ICD-10-CM | POA: Diagnosis not present

## 2021-09-24 DIAGNOSIS — Z86018 Personal history of other benign neoplasm: Secondary | ICD-10-CM | POA: Diagnosis not present

## 2021-09-24 DIAGNOSIS — L814 Other melanin hyperpigmentation: Secondary | ICD-10-CM | POA: Diagnosis not present

## 2021-09-24 DIAGNOSIS — L578 Other skin changes due to chronic exposure to nonionizing radiation: Secondary | ICD-10-CM | POA: Diagnosis not present

## 2021-09-24 DIAGNOSIS — L859 Epidermal thickening, unspecified: Secondary | ICD-10-CM | POA: Diagnosis not present

## 2021-09-26 DIAGNOSIS — H04123 Dry eye syndrome of bilateral lacrimal glands: Secondary | ICD-10-CM | POA: Diagnosis not present

## 2021-09-26 DIAGNOSIS — H02831 Dermatochalasis of right upper eyelid: Secondary | ICD-10-CM | POA: Diagnosis not present

## 2021-09-26 DIAGNOSIS — H02834 Dermatochalasis of left upper eyelid: Secondary | ICD-10-CM | POA: Diagnosis not present

## 2021-09-26 DIAGNOSIS — D3132 Benign neoplasm of left choroid: Secondary | ICD-10-CM | POA: Diagnosis not present

## 2021-09-26 DIAGNOSIS — D3131 Benign neoplasm of right choroid: Secondary | ICD-10-CM | POA: Diagnosis not present

## 2021-09-26 DIAGNOSIS — H10413 Chronic giant papillary conjunctivitis, bilateral: Secondary | ICD-10-CM | POA: Diagnosis not present

## 2021-09-26 DIAGNOSIS — Z9889 Other specified postprocedural states: Secondary | ICD-10-CM | POA: Diagnosis not present

## 2021-09-26 DIAGNOSIS — H2513 Age-related nuclear cataract, bilateral: Secondary | ICD-10-CM | POA: Diagnosis not present

## 2021-10-08 DIAGNOSIS — Z79899 Other long term (current) drug therapy: Secondary | ICD-10-CM | POA: Diagnosis not present

## 2021-10-08 DIAGNOSIS — B353 Tinea pedis: Secondary | ICD-10-CM | POA: Diagnosis not present

## 2021-11-01 DIAGNOSIS — F411 Generalized anxiety disorder: Secondary | ICD-10-CM | POA: Diagnosis not present

## 2021-11-01 DIAGNOSIS — F250 Schizoaffective disorder, bipolar type: Secondary | ICD-10-CM | POA: Diagnosis not present

## 2021-11-15 ENCOUNTER — Telehealth: Payer: Self-pay

## 2021-11-15 DIAGNOSIS — G4700 Insomnia, unspecified: Secondary | ICD-10-CM | POA: Diagnosis not present

## 2021-11-15 DIAGNOSIS — R1310 Dysphagia, unspecified: Secondary | ICD-10-CM | POA: Diagnosis not present

## 2021-11-15 DIAGNOSIS — R195 Other fecal abnormalities: Secondary | ICD-10-CM | POA: Diagnosis not present

## 2021-11-15 NOTE — Patient Outreach (Signed)
  Care Coordination   Initial Visit Note   11/15/2021 Name: Sabel Hornbeck MRN: [id number] DOB: 1960/03/30  Mahiya Kercheval Blodgett is a 61 y.o. year old female who sees Maurice Small, MD for primary care. I spoke with  Graciella Freer by phone today.  What matters to the patients health and wellness today?  No concerns today.  I have my wellness visit scheduled in December    Goals Addressed             This Visit's Progress    COMPLETED: Care Coordination Activities - no follow up required       Care Coordination Interventions: Provided education to patient re: Annual Wellness Visit, care coordination services Assessed social determinant of health barriers          SDOH assessments and interventions completed:  Yes  SDOH Interventions Today    Flowsheet Row Most Recent Value  SDOH Interventions   Food Insecurity Interventions Intervention Not Indicated  Housing Interventions Intervention Not Indicated  Transportation Interventions Intervention Not Indicated  Utilities Interventions Intervention Not Indicated  Financial Strain Interventions Intervention Not Indicated        Care Coordination Interventions Activated:  Yes  Care Coordination Interventions:  Yes, provided   Follow up plan: No further intervention required.   Encounter Outcome:  Pt. Visit Completed  Peter Garter RN, BSN,CCM, CDE Care Management Coordinator [facility] [phone]

## 2021-11-15 NOTE — Patient Outreach (Signed)
  Care Coordination   11/15/2021 Name: Debi Cousin MRN: [id number] DOB: 08-13-1960   Care Coordination Outreach Attempts:  An unsuccessful telephone outreach was attempted today to offer the patient information about available care coordination services as a benefit of their health plan.   Follow Up Plan:  Additional outreach attempts will be made to offer the patient care coordination information and services.   Encounter Outcome:  No Answer  Care Coordination Interventions Activated:  No   Care Coordination Interventions:  No, not indicated    Peter Garter RN, BSN,CCM, [facility] [phone]

## 2021-11-20 DIAGNOSIS — R195 Other fecal abnormalities: Secondary | ICD-10-CM | POA: Diagnosis not present

## 2021-11-21 ENCOUNTER — Emergency Department (HOSPITAL_BASED_OUTPATIENT_CLINIC_OR_DEPARTMENT_OTHER): Payer: Medicare PPO | Admitting: Radiology

## 2021-11-21 ENCOUNTER — Emergency Department (HOSPITAL_BASED_OUTPATIENT_CLINIC_OR_DEPARTMENT_OTHER)
Admission: EM | Admit: 2021-11-21 | Discharge: 2021-11-21 | Disposition: A | Discharge status: Home / Self Care | Payer: Medicare PPO | Attending: Emergency Medicine | Admitting: Emergency Medicine

## 2021-11-21 ENCOUNTER — Other Ambulatory Visit: Payer: Self-pay

## 2021-11-21 ENCOUNTER — Encounter (HOSPITAL_BASED_OUTPATIENT_CLINIC_OR_DEPARTMENT_OTHER): Payer: Self-pay

## 2021-11-21 DIAGNOSIS — S8262XA Displaced fracture of lateral malleolus of left fibula, initial encounter for closed fracture: Secondary | ICD-10-CM | POA: Insufficient documentation

## 2021-11-21 DIAGNOSIS — S8265XA Nondisplaced fracture of lateral malleolus of left fibula, initial encounter for closed fracture: Secondary | ICD-10-CM | POA: Diagnosis not present

## 2021-11-21 DIAGNOSIS — W010XXA Fall on same level from slipping, tripping and stumbling without subsequent striking against object, initial encounter: Secondary | ICD-10-CM | POA: Insufficient documentation

## 2021-11-21 DIAGNOSIS — M25572 Pain in left ankle and joints of left foot: Secondary | ICD-10-CM | POA: Diagnosis present

## 2021-11-21 DIAGNOSIS — Z853 Personal history of malignant neoplasm of breast: Secondary | ICD-10-CM | POA: Diagnosis not present

## 2021-11-21 MED ORDER — ONDANSETRON 8 MG PO TBDP: 8.0000 mg | ORAL_TABLET | Freq: Three times a day (TID) | ORAL | 0 refills | Status: DC | PRN

## 2021-11-21 MED ORDER — ONDANSETRON 4 MG PO TBDP
8.0000 mg | ORAL_TABLET | Freq: Once | ORAL | Status: AC
  Administered 2021-11-21: 8 mg via ORAL
  Filled 2021-11-21: qty 2

## 2021-11-21 MED ORDER — OXYCODONE-ACETAMINOPHEN 5-325 MG PO TABS: 1.0000 | ORAL_TABLET | Freq: Four times a day (QID) | ORAL | 0 refills | Status: DC | PRN

## 2021-11-21 MED ORDER — OXYCODONE-ACETAMINOPHEN 5-325 MG PO TABS
1.0000 | ORAL_TABLET | Freq: Once | ORAL | Status: AC
  Administered 2021-11-21: 1 via ORAL
  Filled 2021-11-21: qty 1

## 2021-11-21 NOTE — Discharge Instructions (Addendum)
You have an avulsion fracture of the lateral malleolus.  Wear the cam walking boot for support, can take it off when you sleep.  Use crutches as needed for walking.  Take the pain medicine as prescribed with nausea medicine prevent vomiting.  Avoid taking at the same time as the Ambien and Xanax as this can make you drowsy and increased risk for falling.  Follow-up with orthopedics in a week, call schedule appointment tomorrow.  Return for new or concerning symptoms.

## 2021-11-21 NOTE — ED Notes (Signed)
CAM boot applied and walking assessed with crutches and pt tolerated well.

## 2021-11-21 NOTE — ED Triage Notes (Signed)
Patient here POV from Home.  Endorses Tripping into a [employer] approximately 30 Minutes ago injuring her Left Ankle in the Process. Swelling and Pain to Same.   NAD Noted during Triage. A&Ox4. GCS 15. BIB Wheelchair.

## 2021-11-21 NOTE — ED Notes (Signed)
Patient given ice pack for left ankle

## 2021-11-21 NOTE — ED Provider Notes (Signed)
[facility] DEPT Provider Note   CSN: [id number] Arrival date & time: 11/21/21  1648     History  Chief Complaint  Patient presents with   Ankle Injury    Vanessa Mills is a 61 y.o. female.   Ankle Injury     Patient with with medical history of bipolar disorder, previous breast cancer status post chemoradiation presents today due to ankle pain after fall.  She tripped walking outside her husband's doctor's office.  Did not hit her head or lose consciousness.  Pain to the left ankle on the lateral side.  Denies any pain elsewhere.  Not on blood thinners, has not had any medicine prior to arrival.  Denies hitting her head or losing consciousness.  Home Medications Prior to Admission medications   Medication Sig Start Date End Date Taking? Authorizing Provider  ondansetron (ZOFRAN-ODT) 8 MG disintegrating tablet Take 1 tablet (8 mg total) by mouth every 8 (eight) hours as needed for nausea or vomiting. 11/21/21  Yes Sherrill Raring, PA-C  oxyCODONE-acetaminophen (PERCOCET/ROXICET) 5-325 MG tablet Take 1 tablet by mouth every 6 (six) hours as needed for severe pain. 11/21/21  Yes Sherrill Raring, PA-C  alprazolam Duanne Moron) 2 MG tablet Take 2 mg by mouth 2 (two) times daily as needed. 09/13/19   [provider]  lisinopril (ZESTRIL) 40 MG tablet Take 40 mg by mouth daily.    [provider]  Paliperidone Palmitate ER (INVEGA TRINZA) 546 MG/1.75ML SUSY Inject 546 mLs into the muscle every 3 (three) months.    [provider]  traZODone (DESYREL) 100 MG tablet Take 1 tablet (100 mg total) by mouth at bedtime as needed for sleep. 01/14/17   Lindell Spar I, NP  zolpidem (AMBIEN) 10 MG tablet Take 10 mg by mouth at bedtime as needed. 09/13/19   [provider]      Allergies    Patient has no known allergies.    Review of Systems   Review of Systems  Physical Exam Updated Vital Signs BP 99/67 (BP Location: Right Arm)    Pulse 72   Temp 98.2 F (36.8 C) (Oral)   Resp 16   Ht 5' 8"  (1.727 m)   Wt 89.4 kg   SpO2 100%   BMI 29.95 kg/m  Physical Exam Vitals and nursing note reviewed. Exam conducted with a chaperone present.  Constitutional:      Appearance: Normal appearance.  HENT:     Head: Normocephalic and atraumatic.  Eyes:     General: No scleral icterus.       Right eye: No discharge.        Left eye: No discharge.     Extraocular Movements: Extraocular movements intact.     Pupils: Pupils are equal, round, and reactive to light.  Cardiovascular:     Rate and Rhythm: Normal rate and regular rhythm.     Pulses: Normal pulses.     Heart sounds: Normal heart sounds. No murmur heard.    No friction rub. No gallop.  Pulmonary:     Effort: Pulmonary effort is normal. No respiratory distress.     Breath sounds: Normal breath sounds.  Abdominal:     General: Abdomen is flat. Bowel sounds are normal. There is no distension.     Palpations: Abdomen is soft.     Tenderness: There is no abdominal tenderness.  Musculoskeletal:        General: Swelling and tenderness present.     Comments: Moving upper  extremity without difficulty.  Full ROM to hip, knees, right lower extremity.  Able to flex and extend the digits.  Patient has reproducible tenderness over the lateral malleolus with trace swelling.  No crepitus, tolerates passive ROM.  Decreased active ROM secondary to pain especially with inversion eversion.  Skin:    General: Skin is warm and dry.     Capillary Refill: Capillary refill takes less than 2 seconds.     Coloration: Skin is not jaundiced.  Neurological:     Mental Status: She is alert. Mental status is at baseline.     Coordination: Coordination normal.     Comments: Sensation to light touch is grossly intact.     ED Results / Procedures / Treatments   Labs (all labs ordered are listed, but only abnormal results are displayed) Labs Reviewed - No data to  display  EKG None  Radiology DG Ankle Complete Left  Result Date: 11/21/2021 CLINICAL DATA:  Fall, tripped and fell EXAM: LEFT ANKLE COMPLETE - 3+ VIEW COMPARISON:  None Available. FINDINGS: Avulsion fracture of the tip of the lateral malleolus with mild distraction 4 mm. Ankle mortise intact. Talar dome normal. IMPRESSION: Ulcer fracture of the tip of the lateral malleolus. Electronically Signed   By: Suzy Bouchard M.D.   On: 11/21/2021 17:40    Procedures Procedures    Medications Ordered in ED Medications  ondansetron (ZOFRAN-ODT) disintegrating tablet 8 mg (8 mg Oral Given 11/21/21 1840)  oxyCODONE-acetaminophen (PERCOCET/ROXICET) 5-325 MG per tablet 1 tablet (1 tablet Oral Given 11/21/21 1839)    ED Course/ Medical Decision Making/ A&P                           Medical Decision Making Amount and/or Complexity of Data Reviewed Radiology: ordered.  Risk Prescription drug management.   Patient presents due to left ankle pain after mechanical fall.  On exam she is neurovascular intact with brisk cap refill and DP and PT are palpable and symmetric bilaterally.  Reproducible tenderness over lateral malleolus, compartments are soft without any appreciable crepitus, lacerations or laxity.  I ordered plain film which is notable for avulsion fracture over the lateral malleolus.  Cam walking boot, crutches ordered for outpatient.  She will follow-up with orthopedics, preference to [facility] as she has seen them previously.  Percocet Zofran ordered for pain with some improvement.  Improvement of pain on reevaluation.  I reviewed external med, discussed with patient she cannot take the narcotics with Ambien and Xanax as these are all CNS depressants.  Return precautions were discussed, stable for outpatient follow-up at this time.        Final Clinical Impression(s) / ED Diagnoses Final diagnoses:  Closed avulsion fracture of lateral malleolus of left fibula, initial  encounter    Rx / DC Orders ED Discharge Orders          Ordered    oxyCODONE-acetaminophen (PERCOCET/ROXICET) 5-325 MG tablet  Every 6 hours PRN        11/21/21 1934    ondansetron (ZOFRAN-ODT) 8 MG disintegrating tablet  Every 8 hours PRN        11/21/21 1934              Sherrill Raring, Hershal Coria 11/21/21 2146    Sherwood Gambler, MD 11/23/21 1535

## 2021-11-27 DIAGNOSIS — M25572 Pain in left ankle and joints of left foot: Secondary | ICD-10-CM | POA: Diagnosis not present

## 2021-12-11 DIAGNOSIS — H61002 Unspecified perichondritis of left external ear: Secondary | ICD-10-CM | POA: Diagnosis not present

## 2021-12-25 DIAGNOSIS — J310 Chronic rhinitis: Secondary | ICD-10-CM | POA: Diagnosis not present

## 2021-12-25 DIAGNOSIS — H6123 Impacted cerumen, bilateral: Secondary | ICD-10-CM | POA: Diagnosis not present

## 2021-12-31 ENCOUNTER — Other Ambulatory Visit: Payer: Self-pay | Admitting: Gastroenterology

## 2021-12-31 DIAGNOSIS — R1311 Dysphagia, oral phase: Secondary | ICD-10-CM | POA: Diagnosis not present

## 2021-12-31 DIAGNOSIS — R1310 Dysphagia, unspecified: Secondary | ICD-10-CM

## 2022-01-01 ENCOUNTER — Ambulatory Visit
Admission: RE | Admit: 2022-01-01 | Discharge: 2022-01-01 | Disposition: A | Discharge status: Home / Self Care | Payer: Medicare PPO | Source: Ambulatory Visit | Attending: Gastroenterology | Admitting: Gastroenterology

## 2022-01-01 DIAGNOSIS — K219 Gastro-esophageal reflux disease without esophagitis: Secondary | ICD-10-CM | POA: Diagnosis not present

## 2022-01-01 DIAGNOSIS — K224 Dyskinesia of esophagus: Secondary | ICD-10-CM | POA: Diagnosis not present

## 2022-01-01 DIAGNOSIS — R1310 Dysphagia, unspecified: Secondary | ICD-10-CM | POA: Diagnosis not present

## 2022-01-18 DIAGNOSIS — I10 Essential (primary) hypertension: Secondary | ICD-10-CM | POA: Diagnosis not present

## 2022-01-18 DIAGNOSIS — G43909 Migraine, unspecified, not intractable, without status migrainosus: Secondary | ICD-10-CM | POA: Diagnosis not present

## 2022-01-18 DIAGNOSIS — S82891A Other fracture of right lower leg, initial encounter for closed fracture: Secondary | ICD-10-CM | POA: Diagnosis not present

## 2022-01-18 DIAGNOSIS — F5101 Primary insomnia: Secondary | ICD-10-CM | POA: Diagnosis not present

## 2022-01-31 DIAGNOSIS — F250 Schizoaffective disorder, bipolar type: Secondary | ICD-10-CM | POA: Diagnosis not present

## 2022-02-05 DIAGNOSIS — M25572 Pain in left ankle and joints of left foot: Secondary | ICD-10-CM | POA: Diagnosis not present

## 2022-02-13 DIAGNOSIS — S8265XD Nondisplaced fracture of lateral malleolus of left fibula, subsequent encounter for closed fracture with routine healing: Secondary | ICD-10-CM | POA: Diagnosis not present

## 2022-02-13 DIAGNOSIS — R262 Difficulty in walking, not elsewhere classified: Secondary | ICD-10-CM | POA: Diagnosis not present

## 2022-03-18 DIAGNOSIS — Z86010 Personal history of colonic polyps: Secondary | ICD-10-CM | POA: Diagnosis not present

## 2022-03-18 DIAGNOSIS — R1311 Dysphagia, oral phase: Secondary | ICD-10-CM | POA: Diagnosis not present

## 2022-03-18 DIAGNOSIS — R194 Change in bowel habit: Secondary | ICD-10-CM | POA: Diagnosis not present

## 2022-03-21 DIAGNOSIS — K5010 Crohn's disease of large intestine without complications: Secondary | ICD-10-CM | POA: Diagnosis not present

## 2022-03-21 DIAGNOSIS — K5900 Constipation, unspecified: Secondary | ICD-10-CM | POA: Diagnosis not present

## 2022-03-21 DIAGNOSIS — R031 Nonspecific low blood-pressure reading: Secondary | ICD-10-CM | POA: Diagnosis not present

## 2022-03-26 DIAGNOSIS — R0981 Nasal congestion: Secondary | ICD-10-CM | POA: Diagnosis not present

## 2022-03-26 DIAGNOSIS — K5909 Other constipation: Secondary | ICD-10-CM | POA: Diagnosis not present

## 2022-04-08 DIAGNOSIS — E785 Hyperlipidemia, unspecified: Secondary | ICD-10-CM | POA: Diagnosis not present

## 2022-04-08 DIAGNOSIS — D696 Thrombocytopenia, unspecified: Secondary | ICD-10-CM | POA: Diagnosis not present

## 2022-04-23 DIAGNOSIS — S8265XD Nondisplaced fracture of lateral malleolus of left fibula, subsequent encounter for closed fracture with routine healing: Secondary | ICD-10-CM | POA: Diagnosis not present

## 2022-04-25 DIAGNOSIS — Z1231 Encounter for screening mammogram for malignant neoplasm of breast: Secondary | ICD-10-CM | POA: Diagnosis not present

## 2022-05-01 DIAGNOSIS — F411 Generalized anxiety disorder: Secondary | ICD-10-CM | POA: Diagnosis not present

## 2022-05-01 DIAGNOSIS — F250 Schizoaffective disorder, bipolar type: Secondary | ICD-10-CM | POA: Diagnosis not present

## 2022-05-02 DIAGNOSIS — R922 Inconclusive mammogram: Secondary | ICD-10-CM | POA: Diagnosis not present

## 2022-05-02 DIAGNOSIS — N6489 Other specified disorders of breast: Secondary | ICD-10-CM | POA: Diagnosis not present

## 2022-05-08 DIAGNOSIS — C50912 Malignant neoplasm of unspecified site of left female breast: Secondary | ICD-10-CM | POA: Diagnosis not present

## 2022-05-13 DIAGNOSIS — J988 Other specified respiratory disorders: Secondary | ICD-10-CM | POA: Diagnosis not present

## 2022-05-13 DIAGNOSIS — F209 Schizophrenia, unspecified: Secondary | ICD-10-CM | POA: Diagnosis not present

## 2022-06-04 DIAGNOSIS — M545 Low back pain, unspecified: Secondary | ICD-10-CM | POA: Diagnosis not present

## 2022-06-18 DIAGNOSIS — M4696 Unspecified inflammatory spondylopathy, lumbar region: Secondary | ICD-10-CM | POA: Diagnosis not present

## 2022-06-18 DIAGNOSIS — M5451 Vertebrogenic low back pain: Secondary | ICD-10-CM | POA: Diagnosis not present

## 2022-07-02 DIAGNOSIS — M5451 Vertebrogenic low back pain: Secondary | ICD-10-CM | POA: Diagnosis not present

## 2022-07-18 DIAGNOSIS — M545 Low back pain, unspecified: Secondary | ICD-10-CM | POA: Diagnosis not present

## 2022-07-24 DIAGNOSIS — E559 Vitamin D deficiency, unspecified: Secondary | ICD-10-CM | POA: Diagnosis not present

## 2022-07-24 DIAGNOSIS — E785 Hyperlipidemia, unspecified: Secondary | ICD-10-CM | POA: Diagnosis not present

## 2022-07-24 DIAGNOSIS — I10 Essential (primary) hypertension: Secondary | ICD-10-CM | POA: Diagnosis not present

## 2022-07-24 DIAGNOSIS — F259 Schizoaffective disorder, unspecified: Secondary | ICD-10-CM | POA: Diagnosis not present

## 2022-07-24 DIAGNOSIS — Z0000 Encounter for general adult medical examination without abnormal findings: Secondary | ICD-10-CM | POA: Diagnosis not present

## 2022-07-24 DIAGNOSIS — G43909 Migraine, unspecified, not intractable, without status migrainosus: Secondary | ICD-10-CM | POA: Diagnosis not present

## 2022-07-24 DIAGNOSIS — F5101 Primary insomnia: Secondary | ICD-10-CM | POA: Diagnosis not present

## 2022-07-24 DIAGNOSIS — M8588 Other specified disorders of bone density and structure, other site: Secondary | ICD-10-CM | POA: Diagnosis not present

## 2022-07-24 DIAGNOSIS — R945 Abnormal results of liver function studies: Secondary | ICD-10-CM | POA: Diagnosis not present

## 2022-07-25 DIAGNOSIS — M545 Low back pain, unspecified: Secondary | ICD-10-CM | POA: Diagnosis not present

## 2022-07-30 DIAGNOSIS — F250 Schizoaffective disorder, bipolar type: Secondary | ICD-10-CM | POA: Diagnosis not present

## 2022-07-30 DIAGNOSIS — F411 Generalized anxiety disorder: Secondary | ICD-10-CM | POA: Diagnosis not present

## 2022-08-08 DIAGNOSIS — M47816 Spondylosis without myelopathy or radiculopathy, lumbar region: Secondary | ICD-10-CM | POA: Diagnosis not present

## 2022-08-29 DIAGNOSIS — M47816 Spondylosis without myelopathy or radiculopathy, lumbar region: Secondary | ICD-10-CM | POA: Diagnosis not present

## 2022-09-05 DIAGNOSIS — M47816 Spondylosis without myelopathy or radiculopathy, lumbar region: Secondary | ICD-10-CM | POA: Diagnosis not present

## 2022-09-24 DIAGNOSIS — R0981 Nasal congestion: Secondary | ICD-10-CM | POA: Diagnosis not present

## 2022-09-24 DIAGNOSIS — Z9109 Other allergy status, other than to drugs and biological substances: Secondary | ICD-10-CM | POA: Diagnosis not present

## 2022-09-27 DIAGNOSIS — J300 Vasomotor rhinitis: Secondary | ICD-10-CM | POA: Diagnosis not present

## 2022-09-27 DIAGNOSIS — J3089 Other allergic rhinitis: Secondary | ICD-10-CM | POA: Diagnosis not present

## 2022-10-02 DIAGNOSIS — Z9889 Other specified postprocedural states: Secondary | ICD-10-CM | POA: Diagnosis not present

## 2022-10-02 DIAGNOSIS — H04123 Dry eye syndrome of bilateral lacrimal glands: Secondary | ICD-10-CM | POA: Diagnosis not present

## 2022-10-02 DIAGNOSIS — D3131 Benign neoplasm of right choroid: Secondary | ICD-10-CM | POA: Diagnosis not present

## 2022-10-02 DIAGNOSIS — G43B0 Ophthalmoplegic migraine, not intractable: Secondary | ICD-10-CM | POA: Diagnosis not present

## 2022-10-02 DIAGNOSIS — H02831 Dermatochalasis of right upper eyelid: Secondary | ICD-10-CM | POA: Diagnosis not present

## 2022-10-02 DIAGNOSIS — H10413 Chronic giant papillary conjunctivitis, bilateral: Secondary | ICD-10-CM | POA: Diagnosis not present

## 2022-10-02 DIAGNOSIS — H2513 Age-related nuclear cataract, bilateral: Secondary | ICD-10-CM | POA: Diagnosis not present

## 2022-10-02 DIAGNOSIS — D3132 Benign neoplasm of left choroid: Secondary | ICD-10-CM | POA: Diagnosis not present

## 2022-10-02 DIAGNOSIS — H02834 Dermatochalasis of left upper eyelid: Secondary | ICD-10-CM | POA: Diagnosis not present

## 2022-10-03 DIAGNOSIS — M47816 Spondylosis without myelopathy or radiculopathy, lumbar region: Secondary | ICD-10-CM | POA: Diagnosis not present

## 2022-10-14 DIAGNOSIS — M47816 Spondylosis without myelopathy or radiculopathy, lumbar region: Secondary | ICD-10-CM | POA: Diagnosis not present

## 2022-10-14 DIAGNOSIS — J310 Chronic rhinitis: Secondary | ICD-10-CM | POA: Diagnosis not present

## 2022-10-14 DIAGNOSIS — J343 Hypertrophy of nasal turbinates: Secondary | ICD-10-CM | POA: Diagnosis not present

## 2022-10-14 DIAGNOSIS — J342 Deviated nasal septum: Secondary | ICD-10-CM | POA: Diagnosis not present

## 2022-10-15 DIAGNOSIS — M4807 Spinal stenosis, lumbosacral region: Secondary | ICD-10-CM | POA: Diagnosis not present

## 2022-10-22 DIAGNOSIS — M4807 Spinal stenosis, lumbosacral region: Secondary | ICD-10-CM | POA: Diagnosis not present

## 2022-10-24 DIAGNOSIS — M4807 Spinal stenosis, lumbosacral region: Secondary | ICD-10-CM | POA: Diagnosis not present

## 2022-10-29 DIAGNOSIS — M4807 Spinal stenosis, lumbosacral region: Secondary | ICD-10-CM | POA: Diagnosis not present

## 2022-10-31 DIAGNOSIS — J3489 Other specified disorders of nose and nasal sinuses: Secondary | ICD-10-CM | POA: Diagnosis not present

## 2022-10-31 DIAGNOSIS — J343 Hypertrophy of nasal turbinates: Secondary | ICD-10-CM | POA: Diagnosis not present

## 2022-10-31 DIAGNOSIS — J342 Deviated nasal septum: Secondary | ICD-10-CM | POA: Diagnosis not present

## 2022-10-31 DIAGNOSIS — J351 Hypertrophy of tonsils: Secondary | ICD-10-CM | POA: Diagnosis not present

## 2022-11-04 ENCOUNTER — Ambulatory Visit (INDEPENDENT_AMBULATORY_CARE_PROVIDER_SITE_OTHER): Payer: Medicare PPO | Admitting: Otolaryngology

## 2022-11-04 ENCOUNTER — Encounter (INDEPENDENT_AMBULATORY_CARE_PROVIDER_SITE_OTHER): Payer: Self-pay | Admitting: Otolaryngology

## 2022-11-04 VITALS — Ht 69.0 in | Wt 210.0 lb

## 2022-11-04 DIAGNOSIS — Z9889 Other specified postprocedural states: Secondary | ICD-10-CM

## 2022-11-04 DIAGNOSIS — J310 Chronic rhinitis: Secondary | ICD-10-CM

## 2022-11-04 NOTE — Progress Notes (Signed)
Patient ID: Vanessa Mills, female   DOB: [birth year], 62 y.o.   MRN: [id number]  Ralph Leyden splints removed.   Septum and turbinates are healing well.   Both [place] debrided.Nasal saline irrigation.  Recheck in 3 weeks.

## 2022-11-06 DIAGNOSIS — R945 Abnormal results of liver function studies: Secondary | ICD-10-CM | POA: Diagnosis not present

## 2022-11-25 ENCOUNTER — Ambulatory Visit (INDEPENDENT_AMBULATORY_CARE_PROVIDER_SITE_OTHER): Payer: Medicare PPO | Admitting: Otolaryngology

## 2022-11-25 ENCOUNTER — Encounter (INDEPENDENT_AMBULATORY_CARE_PROVIDER_SITE_OTHER): Payer: Self-pay

## 2022-11-25 VITALS — Ht 69.0 in | Wt 210.0 lb

## 2022-11-25 DIAGNOSIS — Z9889 Other specified postprocedural states: Secondary | ICD-10-CM

## 2022-11-25 DIAGNOSIS — J310 Chronic rhinitis: Secondary | ICD-10-CM

## 2022-11-25 NOTE — Progress Notes (Signed)
Patient ID: Vanessa Mills, female   DOB: 05/04/1960, 62 y.o.   MRN: [id number]  Septum and turbinates are healing well.   Both [place] debrided.  Nasal saline irrigation as needed.   Recheck in 3 months.

## 2022-11-26 DIAGNOSIS — H16141 Punctate keratitis, right eye: Secondary | ICD-10-CM | POA: Diagnosis not present

## 2022-11-26 DIAGNOSIS — M4807 Spinal stenosis, lumbosacral region: Secondary | ICD-10-CM | POA: Diagnosis not present

## 2022-11-28 DIAGNOSIS — M4807 Spinal stenosis, lumbosacral region: Secondary | ICD-10-CM | POA: Diagnosis not present

## 2022-12-03 DIAGNOSIS — L821 Other seborrheic keratosis: Secondary | ICD-10-CM | POA: Diagnosis not present

## 2022-12-03 DIAGNOSIS — M4807 Spinal stenosis, lumbosacral region: Secondary | ICD-10-CM | POA: Diagnosis not present

## 2022-12-03 DIAGNOSIS — Z86018 Personal history of other benign neoplasm: Secondary | ICD-10-CM | POA: Diagnosis not present

## 2022-12-03 DIAGNOSIS — L814 Other melanin hyperpigmentation: Secondary | ICD-10-CM | POA: Diagnosis not present

## 2022-12-03 DIAGNOSIS — L578 Other skin changes due to chronic exposure to nonionizing radiation: Secondary | ICD-10-CM | POA: Diagnosis not present

## 2022-12-05 DIAGNOSIS — H0589 Other disorders of orbit: Secondary | ICD-10-CM | POA: Diagnosis not present

## 2022-12-10 DIAGNOSIS — M4807 Spinal stenosis, lumbosacral region: Secondary | ICD-10-CM | POA: Diagnosis not present

## 2022-12-12 DIAGNOSIS — M4807 Spinal stenosis, lumbosacral region: Secondary | ICD-10-CM | POA: Diagnosis not present

## 2022-12-17 DIAGNOSIS — M4807 Spinal stenosis, lumbosacral region: Secondary | ICD-10-CM | POA: Diagnosis not present

## 2022-12-19 DIAGNOSIS — M4807 Spinal stenosis, lumbosacral region: Secondary | ICD-10-CM | POA: Diagnosis not present

## 2022-12-24 DIAGNOSIS — M4807 Spinal stenosis, lumbosacral region: Secondary | ICD-10-CM | POA: Diagnosis not present

## 2022-12-31 DIAGNOSIS — M4807 Spinal stenosis, lumbosacral region: Secondary | ICD-10-CM | POA: Diagnosis not present

## 2023-01-02 DIAGNOSIS — M4807 Spinal stenosis, lumbosacral region: Secondary | ICD-10-CM | POA: Diagnosis not present

## 2023-01-09 DIAGNOSIS — M4807 Spinal stenosis, lumbosacral region: Secondary | ICD-10-CM | POA: Diagnosis not present

## 2023-01-13 DIAGNOSIS — F250 Schizoaffective disorder, bipolar type: Secondary | ICD-10-CM | POA: Diagnosis not present

## 2023-01-13 DIAGNOSIS — F411 Generalized anxiety disorder: Secondary | ICD-10-CM | POA: Diagnosis not present

## 2023-01-16 DIAGNOSIS — M4807 Spinal stenosis, lumbosacral region: Secondary | ICD-10-CM | POA: Diagnosis not present

## 2023-02-04 DIAGNOSIS — M4807 Spinal stenosis, lumbosacral region: Secondary | ICD-10-CM | POA: Diagnosis not present

## 2023-02-05 DIAGNOSIS — Z5181 Encounter for therapeutic drug level monitoring: Secondary | ICD-10-CM | POA: Diagnosis not present

## 2023-02-05 DIAGNOSIS — F259 Schizoaffective disorder, unspecified: Secondary | ICD-10-CM | POA: Diagnosis not present

## 2023-02-05 DIAGNOSIS — F5101 Primary insomnia: Secondary | ICD-10-CM | POA: Diagnosis not present

## 2023-02-05 DIAGNOSIS — I10 Essential (primary) hypertension: Secondary | ICD-10-CM | POA: Diagnosis not present

## 2023-02-05 DIAGNOSIS — K5010 Crohn's disease of large intestine without complications: Secondary | ICD-10-CM | POA: Diagnosis not present

## 2023-02-06 DIAGNOSIS — M4807 Spinal stenosis, lumbosacral region: Secondary | ICD-10-CM | POA: Diagnosis not present

## 2023-02-25 DIAGNOSIS — M4807 Spinal stenosis, lumbosacral region: Secondary | ICD-10-CM | POA: Diagnosis not present

## 2023-03-03 DIAGNOSIS — F259 Schizoaffective disorder, unspecified: Secondary | ICD-10-CM | POA: Diagnosis not present

## 2023-03-03 DIAGNOSIS — F10220 Alcohol dependence with intoxication, uncomplicated: Secondary | ICD-10-CM | POA: Diagnosis not present

## 2023-03-06 DIAGNOSIS — M4807 Spinal stenosis, lumbosacral region: Secondary | ICD-10-CM | POA: Diagnosis not present

## 2023-03-13 DIAGNOSIS — F251 Schizoaffective disorder, depressive type: Secondary | ICD-10-CM | POA: Diagnosis not present

## 2023-03-13 DIAGNOSIS — F10220 Alcohol dependence with intoxication, uncomplicated: Secondary | ICD-10-CM | POA: Diagnosis not present

## 2023-03-19 DIAGNOSIS — M4807 Spinal stenosis, lumbosacral region: Secondary | ICD-10-CM | POA: Diagnosis not present

## 2023-03-20 DIAGNOSIS — F10220 Alcohol dependence with intoxication, uncomplicated: Secondary | ICD-10-CM | POA: Diagnosis not present

## 2023-03-20 DIAGNOSIS — F251 Schizoaffective disorder, depressive type: Secondary | ICD-10-CM | POA: Diagnosis not present

## 2023-03-26 DIAGNOSIS — M4807 Spinal stenosis, lumbosacral region: Secondary | ICD-10-CM | POA: Diagnosis not present

## 2023-03-27 DIAGNOSIS — F251 Schizoaffective disorder, depressive type: Secondary | ICD-10-CM | POA: Diagnosis not present

## 2023-03-27 DIAGNOSIS — F10220 Alcohol dependence with intoxication, uncomplicated: Secondary | ICD-10-CM | POA: Diagnosis not present

## 2023-04-02 DIAGNOSIS — M4807 Spinal stenosis, lumbosacral region: Secondary | ICD-10-CM | POA: Diagnosis not present

## 2023-04-03 DIAGNOSIS — F251 Schizoaffective disorder, depressive type: Secondary | ICD-10-CM | POA: Diagnosis not present

## 2023-04-03 DIAGNOSIS — F10220 Alcohol dependence with intoxication, uncomplicated: Secondary | ICD-10-CM | POA: Diagnosis not present

## 2023-04-09 DIAGNOSIS — M4807 Spinal stenosis, lumbosacral region: Secondary | ICD-10-CM | POA: Diagnosis not present

## 2023-05-05 DIAGNOSIS — R922 Inconclusive mammogram: Secondary | ICD-10-CM | POA: Diagnosis not present

## 2023-05-05 DIAGNOSIS — Z1231 Encounter for screening mammogram for malignant neoplasm of breast: Secondary | ICD-10-CM | POA: Diagnosis not present

## 2023-05-20 ENCOUNTER — Telehealth (INDEPENDENT_AMBULATORY_CARE_PROVIDER_SITE_OTHER): Payer: Self-pay | Admitting: Otolaryngology

## 2023-05-20 NOTE — Telephone Encounter (Signed)
 Patient called to confirm 04/29 appointment with Dr. Darlin Ehrlich

## 2023-05-27 ENCOUNTER — Ambulatory Visit (INDEPENDENT_AMBULATORY_CARE_PROVIDER_SITE_OTHER): Payer: Medicare PPO | Admitting: Otolaryngology

## 2023-05-27 ENCOUNTER — Encounter (INDEPENDENT_AMBULATORY_CARE_PROVIDER_SITE_OTHER): Payer: Self-pay

## 2023-05-27 VITALS — BP 134/83 | HR 70 | Ht 69.0 in | Wt 207.0 lb

## 2023-05-27 DIAGNOSIS — J343 Hypertrophy of nasal turbinates: Secondary | ICD-10-CM | POA: Insufficient documentation

## 2023-05-27 DIAGNOSIS — R0981 Nasal congestion: Secondary | ICD-10-CM | POA: Diagnosis not present

## 2023-05-27 DIAGNOSIS — J310 Chronic rhinitis: Secondary | ICD-10-CM | POA: Insufficient documentation

## 2023-05-27 NOTE — Progress Notes (Signed)
 Patient ID: Vanessa Mills, female   DOB: 12/07/1960, 63 y.o.   MRN: [id number]  Follow-up: Chronic nasal obstruction  HPI: The patient is a 63 year old female who returns today for her follow-up evaluation.  The patient was previously seen for chronic nasal obstruction.  She was noted to have nasal septal deviation and bilateral inferior turbinate hypertrophy.  She underwent septoplasty and turbinate reduction surgery in October 2024.  The patient returns today reporting significant improvement in her nasal breathing.  She has noted only mild nasal congestion during the pollen seasons.  She denies any facial pain, fever, or visual change.  Exam: General: Communicates without difficulty, well nourished, no acute distress. Head: Normocephalic, no evidence injury, no tenderness, facial buttresses intact without stepoff. Face/sinus: No tenderness to palpation and percussion. Facial movement is normal and symmetric. Eyes: PERRL, EOMI. No scleral icterus, conjunctivae clear. Neuro: CN II exam reveals vision grossly intact.  No nystagmus at any point of gaze. Ears: Auricles well formed without lesions.  Ear canals are intact without mass or lesion.  No erythema or edema is appreciated.  The TMs are intact without fluid. Nose: External evaluation reveals normal support and skin without lesions.  Dorsum is intact.  Anterior rhinoscopy reveals congested mucosa over anterior aspect of inferior turbinates and intact septum.  No purulence noted. Oral:  Oral cavity and oropharynx are intact, symmetric, without erythema or edema.  Mucosa is moist without lesions. Neck: Full range of motion without pain.  There is no significant lymphadenopathy.  No masses palpable.  Thyroid  bed within normal limits to palpation.  Parotid glands and submandibular glands equal bilaterally without mass.  Trachea is midline. Neuro:  CN 2-12 grossly intact.   Assessment: 1.  Chronic rhinitis with mild nasal mucosal congestion. 2.   Her septum and turbinates are well-healed.  Her nasal passageways are patent bilaterally.  Plan: 1.  The physical exam findings are reviewed with the patient. 2.  Flonase nasal spray and nasal saline irrigation as needed. 3.  The patient is encouraged to call with any questions or concerns.

## 2023-06-17 DIAGNOSIS — M4807 Spinal stenosis, lumbosacral region: Secondary | ICD-10-CM | POA: Diagnosis not present

## 2023-06-18 DIAGNOSIS — F411 Generalized anxiety disorder: Secondary | ICD-10-CM | POA: Diagnosis not present

## 2023-06-18 DIAGNOSIS — F250 Schizoaffective disorder, bipolar type: Secondary | ICD-10-CM | POA: Diagnosis not present

## 2023-06-19 ENCOUNTER — Encounter (HOSPITAL_BASED_OUTPATIENT_CLINIC_OR_DEPARTMENT_OTHER): Payer: Self-pay

## 2023-06-19 ENCOUNTER — Ambulatory Visit: Payer: Self-pay | Admitting: *Deleted

## 2023-06-19 ENCOUNTER — Other Ambulatory Visit: Payer: Self-pay

## 2023-06-19 ENCOUNTER — Emergency Department (HOSPITAL_BASED_OUTPATIENT_CLINIC_OR_DEPARTMENT_OTHER)
Admission: EM | Admit: 2023-06-19 | Discharge: 2023-06-19 | Disposition: A | Discharge status: Home / Self Care | Attending: Emergency Medicine | Admitting: Emergency Medicine

## 2023-06-19 ENCOUNTER — Emergency Department (HOSPITAL_BASED_OUTPATIENT_CLINIC_OR_DEPARTMENT_OTHER): Admitting: Radiology

## 2023-06-19 DIAGNOSIS — R0789 Other chest pain: Secondary | ICD-10-CM | POA: Insufficient documentation

## 2023-06-19 DIAGNOSIS — R079 Chest pain, unspecified: Secondary | ICD-10-CM | POA: Diagnosis not present

## 2023-06-19 LAB — BASIC METABOLIC PANEL WITH GFR
Anion gap: 14 (ref 5–15)
BUN: 9 mg/dL (ref 8–23)
CO2: 22 mmol/L (ref 22–32)
Calcium: 9.7 mg/dL (ref 8.9–10.3)
Chloride: 100 mmol/L (ref 98–111)
Creatinine, Ser: 0.86 mg/dL (ref 0.44–1.00)
GFR, Estimated: >60 mL/min (ref >60)
Glucose, Bld: 112 mg/dL — ABNORMAL HIGH (ref 70–99)
Potassium: 4.1 mmol/L (ref 3.5–5.1)
Sodium: 136 mmol/L (ref 135–145)

## 2023-06-19 LAB — TROPONIN T, HIGH SENSITIVITY
Troponin T High Sensitivity: <15 ng/L (ref <19)
Troponin T High Sensitivity: <15 ng/L (ref <19)

## 2023-06-19 LAB — CBC
HCT: 44.6 % (ref 36.0–46.0)
Hemoglobin: 15.2 g/dL — ABNORMAL HIGH (ref 12.0–15.0)
MCH: 31.7 pg (ref 26.0–34.0)
MCHC: 34.1 g/dL (ref 30.0–36.0)
MCV: 92.9 fL (ref 80.0–100.0)
Platelets: 392 10*3/uL (ref 150–400)
RBC: 4.8 MIL/uL (ref 3.87–5.11)
RDW: 12.6 % (ref 11.5–15.5)
WBC: 6.6 10*3/uL (ref 4.0–10.5)
nRBC: 0 % (ref 0.0–0.2)

## 2023-06-19 NOTE — ED Notes (Signed)
 ED Provider at bedside.

## 2023-06-19 NOTE — Discharge Instructions (Signed)

## 2023-06-19 NOTE — Telephone Encounter (Signed)
 Copied from CRM [id number]. Topic: Clinical - Red Word Triage >> Jun 19, 2023  1:33 PM Leory Rands wrote: Red Word that prompted transfer to Nurse Triage: Patient is calling to report left side heart pain for couple of months. Reason for Disposition  [1] Chest pain (or "angina") comes and goes AND [2] is happening more often (increasing in frequency) or getting worse (increasing in severity)  (Exception: Chest pains that last only a few seconds.)  Answer Assessment - Initial Assessment Questions 1. LOCATION: "Where does it hurt?"       I'm having left sided chest pain for 2 months.    I have high BP.   No heart issues.    2. RADIATION: "Does the pain go anywhere else?" (e.g., into neck, jaw, arms, back)     Just the left side of my chest 3. ONSET: "When did the chest pain begin?" (Minutes, hours or days)      2 months ago 4. PATTERN: "Does the pain come and go, or has it been constant since it started?"  "Does it get worse with exertion?"      It's not all the time.   It's intermittent.   5. DURATION: "How long does it last" (e.g., seconds, minutes, hours)     If I swallow something it hurts going down.  It's painful.   If I burp I feel the pain in my heart.    I have a swallowing problem that I take a pill for.   6. SEVERITY: "How bad is the pain?"  (e.g., Scale 1-10; mild, moderate, or severe)    - MILD (1-3): doesn't interfere with normal activities     - MODERATE (4-7): interferes with normal activities or awakens from sleep    - SEVERE (8-10): excruciating pain, unable to do any normal activities       3/10 pain scale 7. CARDIAC RISK FACTORS: "Do you have any history of heart problems or risk factors for heart disease?" (e.g., angina, prior heart attack; diabetes, high blood pressure, high cholesterol, smoker, or strong family history of heart disease)     No  except both parents have heart issues.    8. PULMONARY RISK FACTORS: "Do you have any history of lung disease?"  (e.g., blood clots  in lung, asthma, emphysema, birth control pills)     No 9. CAUSE: "What do you think is causing the chest pain?"     I have a swallowing problem.   The GI doctor diagnosed me swallowing difficulty.  I did a barium swallow test.    This pain feels very different than that. 10. OTHER SYMPTOMS: "Do you have any other symptoms?" (e.g., dizziness, nausea, vomiting, sweating, fever, difficulty breathing, cough)       Denies dizziness, sweating or arm pain or pain in shoulder blades or upper back.    My sister has had a heart attack.  She has a NP she sees.  Judene Noss, NP with [facility] at [place].    I advised she go to the ED. 11. PREGNANCY: "Is there any chance you are pregnant?" "When was your last menstrual period?"       N/A due to age  Protocols used: Chest Pain-A-AH  Chief Complaint: Pt called in on the community line of the [facility] c/o left sided chest pain for 2 months. Symptoms: No radiation.   She has a swallowing problem but this pain is very different than that.  Both parents have cardiac issues and her sister has had a heart attack.   Frequency: daily for 2 months now. Pertinent Negatives: Patient denies sweating, dizziness, radiation of pain in either arm/shoulder or upper back, no shortness of breath. Disposition: [x] ED /[] Urgent Care (no appt availability in office) / [] Appointment(In office/virtual)/ []  [facility]/ [] Home Care/ [] Refused Recommended Disposition /[] [facility]/ []  Follow-up with PCP Additional Notes: I have referred her to the ED.   She sees Judene Noss, NP at [facility] at [place] as her PCP.    She thanked me for my help but did not say one way or the other if she was going to the ED or not.

## 2023-06-19 NOTE — ED Provider Notes (Signed)
 [place] EMERGENCY DEPARTMENT AT [facility] Provider Note   CSN: [id number] Arrival date & time: 06/19/23  1401     History  Chief Complaint  Patient presents with   Chest Pain    Vanessa Mills is a 63 y.o. female.  The history is provided by the patient. No language interpreter was used.  Chest Pain Pain location:  L chest Pain quality: sharp   Pain radiates to:  Does not radiate Pain severity:  Moderate Onset quality:  Sudden Duration:  2 weeks Timing:  Intermittent Context comment:  Swallowing, belching, intermittently when walking Relieved by:  Nothing Associated symptoms: no back pain, no diaphoresis, no fatigue, no nausea, no shortness of breath and no vomiting   Risk factors: hypertension and obesity   Risk factors comment:  Family hx sister w/ MI in her 44s      Home Medications Prior to Admission medications   Medication Sig Start Date End Date Taking? Authorizing Provider  alprazolam  (XANAX ) 2 MG tablet Take 2 mg by mouth 2 (two) times daily as needed. 09/13/19   [provider]  lisinopril  (ZESTRIL ) 40 MG tablet Take 40 mg by mouth daily.    [provider]  ondansetron  (ZOFRAN -ODT) 8 MG disintegrating tablet Take 1 tablet (8 mg total) by mouth every 8 (eight) hours as needed for nausea or vomiting. 11/21/21   Delray Fielding, PA-C  oxyCODONE -acetaminophen  (PERCOCET/ROXICET) 5-325 MG tablet Take 1 tablet by mouth every 6 (six) hours as needed for severe pain. 11/21/21   Delray Fielding, PA-C  Paliperidone  Palmitate ER (INVEGA  TRINZA) 546 MG/1.75ML SUSY Inject 546 mLs into the muscle every 3 (three) months.    [provider]  traZODone  (DESYREL ) 100 MG tablet Take 1 tablet (100 mg total) by mouth at bedtime as needed for sleep. 01/14/17   Asuncion Layer I, NP  zolpidem  (AMBIEN ) 10 MG tablet Take 10 mg by mouth at bedtime as needed. 09/13/19   [provider]      Allergies    Patient has no known allergies.     Review of Systems   Review of Systems  Constitutional:  Negative for diaphoresis and fatigue.  Respiratory:  Negative for shortness of breath.   Cardiovascular:  Positive for chest pain.  Gastrointestinal:  Negative for nausea and vomiting.  Musculoskeletal:  Negative for back pain.    Physical Exam Updated Vital Signs BP 138/79   Pulse 61   Temp 98.5 F (36.9 C)   Resp 18   SpO2 95%  Physical Exam Vitals and nursing note reviewed.  Constitutional:      General: She is not in acute distress.    Appearance: She is well-developed. She is not diaphoretic.  HENT:     Head: Normocephalic and atraumatic.     Right Ear: External ear normal.     Left Ear: External ear normal.     Nose: Nose normal.     Mouth/Throat:     Mouth: Mucous membranes are moist.  Eyes:     General: No scleral icterus.    Conjunctiva/sclera: Conjunctivae normal.  Cardiovascular:     Rate and Rhythm: Normal rate and regular rhythm.     Heart sounds: Normal heart sounds. No murmur heard.    No friction rub. No gallop.  Pulmonary:     Effort: Pulmonary effort is normal. No respiratory distress.     Breath sounds: Normal breath sounds.  Abdominal:     General: Bowel sounds are normal. There  is no distension.     Palpations: Abdomen is soft. There is no mass.     Tenderness: There is no abdominal tenderness. There is no guarding.  Musculoskeletal:     Cervical back: Normal range of motion.  Skin:    General: Skin is warm and dry.  Neurological:     Mental Status: She is alert and oriented to person, place, and time.  Psychiatric:        Behavior: Behavior normal.     ED Results / Procedures / Treatments   Labs (all labs ordered are listed, but only abnormal results are displayed) Labs Reviewed  BASIC METABOLIC PANEL WITH GFR - Abnormal; Notable for the following components:      Result Value   Glucose, Bld 112 (*)    All other components within normal limits  CBC - Abnormal; Notable for  the following components:   Hemoglobin 15.2 (*)    All other components within normal limits  TROPONIN T, HIGH SENSITIVITY  TROPONIN T, HIGH SENSITIVITY    EKG EKG Interpretation Date/Time:  Thursday Jun 19 2023 15:54:32 EDT Ventricular Rate:  63 PR Interval:  113 QRS Duration:  80 QT Interval:  456 QTC Calculation: 467 R Axis:   52  Text Interpretation: Sinus rhythm Borderline short PR interval Anteroseptal infarct, age indeterminate No significant change since prior 7/18 Confirmed by Vanessa Mills [phone]) on 06/19/2023 4:36:31 PM  Radiology DG Chest 2 View Result Date: 06/19/2023 CLINICAL DATA:  Chest pain. EXAM: CHEST - 2 VIEW COMPARISON:  None Available. FINDINGS: No focal consolidation, pleural effusion, pneumothorax. The cardiac silhouette is within normal limits. No acute osseous pathology. IMPRESSION: No active cardiopulmonary disease. Electronically Signed   By: Vanessa Mills M.D.   On: 06/19/2023 17:01    Procedures Procedures    Medications Ordered in ED Medications - No data to display  ED Course/ Medical Decision Making/ A&P Clinical Course as of 06/20/23 1104  Thu Jun 19, 2023  1612 Hemoglobin(!): 15.2 [AH]  1612 Troponin T High Sensitivity: <15 [AH]  1627 EKG not crossing into epic.  Normal sinus rhythm no acute ST-T changes.  Possible old anteroseptal infarct [MB]  1853 Basic metabolic panel(!) [AH]  1853 CBC(!) [AH]    Clinical Course User Index [AH] Vanessa Stangelo, PA-C [MB] Vanessa Fredrickson, MD             HEART Score: 3                    Medical Decision Making Amount and/or Complexity of Data Reviewed Labs: ordered. Decision-making details documented in ED Course. Radiology: ordered and independent interpretation performed.    Details: I personally visualized and interpreted the images using our PACS system. Acute findings include:  No acute findings on cxr  ECG/medicine tests: ordered and independent interpretation performed.     Details: Sinus rhythm at a rate of 63   Given the large differential diagnosis for [facility], the decision making in this case is of high complexity.  After evaluating all of the data points in this case, the presentation of Vanessa Mills is NOT consistent with Acute Coronary Syndrome (ACS) and/or myocardial ischemia, pulmonary embolism, aortic dissection; Borhaave's, significant arrythmia, pneumothorax, cardiac tamponade, or other emergent cardiopulmonary condition.  Further, the presentation of Merina Behrendt is NOT consistent with pericarditis, myocarditis, cholecystitis, pancreatitis, mediastinitis, endocarditis, new valvular disease.  Additionally, the presentation of Tameka Hoiland Scottis NOT consistent with flail chest, cardiac contusion,  ARDS, or significant intra-thoracic or intra-abdominal bleeding.  Moreover, this presentation is NOT consistent with pneumonia, sepsis, or pyelonephritis.  The patient has a HEART Score: 3     Strict return and follow-up precautions have been given by me personally or by detailed written instruction given verbally by nursing staff using the teach back method to the patient/family/caregiver(s).  Data Reviewed/Counseling: I have reviewed the patient's vital signs, nursing notes, and other relevant tests/information. I had a detailed discussion regarding the historical points, exam findings, and any diagnostic results supporting the discharge diagnosis. I also discussed the need for outpatient follow-up and the need to return to the ED if symptoms worsen or if there are any questions or concerns that arise at home.         Final Clinical Impression(s) / ED Diagnoses Final diagnoses:  Atypical chest pain    Rx / DC Orders ED Discharge Orders     None         Tama Fails, PA-C 06/20/23 1105    Vanessa Fredrickson, MD 06/21/23 1025

## 2023-06-19 NOTE — ED Triage Notes (Signed)
 Pt c/o "heart pain" x56mos, states she mentioned it "to old cardiologist office nurse, she told me to come in." C/o "sharp pain on L side of my heart," intermittent w no associated SHOB, NV, radiation. Denies known cardiac hx besides HTN

## 2023-06-20 ENCOUNTER — Other Ambulatory Visit: Payer: Self-pay | Admitting: Neurosurgery

## 2023-06-20 DIAGNOSIS — M4807 Spinal stenosis, lumbosacral region: Secondary | ICD-10-CM

## 2023-06-24 ENCOUNTER — Encounter: Payer: Self-pay | Admitting: Neurosurgery

## 2023-06-25 DIAGNOSIS — Z853 Personal history of malignant neoplasm of breast: Secondary | ICD-10-CM | POA: Diagnosis not present

## 2023-06-25 DIAGNOSIS — R600 Localized edema: Secondary | ICD-10-CM | POA: Diagnosis not present

## 2023-06-25 DIAGNOSIS — R079 Chest pain, unspecified: Secondary | ICD-10-CM | POA: Diagnosis not present

## 2023-07-02 ENCOUNTER — Other Ambulatory Visit

## 2023-07-14 DIAGNOSIS — M25562 Pain in left knee: Secondary | ICD-10-CM | POA: Diagnosis not present

## 2023-07-16 DIAGNOSIS — Z860100 Personal history of colon polyps, unspecified: Secondary | ICD-10-CM | POA: Diagnosis not present

## 2023-07-16 DIAGNOSIS — R194 Change in bowel habit: Secondary | ICD-10-CM | POA: Diagnosis not present

## 2023-07-16 DIAGNOSIS — R1311 Dysphagia, oral phase: Secondary | ICD-10-CM | POA: Diagnosis not present

## 2023-07-16 DIAGNOSIS — K5010 Crohn's disease of large intestine without complications: Secondary | ICD-10-CM | POA: Diagnosis not present

## 2023-07-16 DIAGNOSIS — K219 Gastro-esophageal reflux disease without esophagitis: Secondary | ICD-10-CM | POA: Diagnosis not present

## 2023-08-05 ENCOUNTER — Telehealth (INDEPENDENT_AMBULATORY_CARE_PROVIDER_SITE_OTHER): Payer: Self-pay | Admitting: Otolaryngology

## 2023-08-05 NOTE — Telephone Encounter (Signed)
 Patient called and is experiencing a terrible stuffy nose and patient took Excedrin Migraine for a headache.  Stuffiness is on both sides of nose but right side is worse.  Patient would like to know if Dr. Karis can call in anything or if there is something OTC he can recommend.  She can be reached at [phone].

## 2023-08-05 NOTE — Telephone Encounter (Signed)
 Patient called today and was c/o terrible stuffy nose. Per Dr.Teoh, I called a Prednisone 10 mg Dosepack for 10 days at [facility] in [place].

## 2023-08-12 DIAGNOSIS — Z780 Asymptomatic menopausal state: Secondary | ICD-10-CM | POA: Diagnosis not present

## 2023-08-12 DIAGNOSIS — Z1331 Encounter for screening for depression: Secondary | ICD-10-CM | POA: Diagnosis not present

## 2023-08-12 DIAGNOSIS — F259 Schizoaffective disorder, unspecified: Secondary | ICD-10-CM | POA: Diagnosis not present

## 2023-08-12 DIAGNOSIS — G43909 Migraine, unspecified, not intractable, without status migrainosus: Secondary | ICD-10-CM | POA: Diagnosis not present

## 2023-08-12 DIAGNOSIS — M8580 Other specified disorders of bone density and structure, unspecified site: Secondary | ICD-10-CM | POA: Diagnosis not present

## 2023-08-12 DIAGNOSIS — E785 Hyperlipidemia, unspecified: Secondary | ICD-10-CM | POA: Diagnosis not present

## 2023-08-12 DIAGNOSIS — F5101 Primary insomnia: Secondary | ICD-10-CM | POA: Diagnosis not present

## 2023-08-12 DIAGNOSIS — Z0000 Encounter for general adult medical examination without abnormal findings: Secondary | ICD-10-CM | POA: Diagnosis not present

## 2023-08-12 DIAGNOSIS — I10 Essential (primary) hypertension: Secondary | ICD-10-CM | POA: Diagnosis not present

## 2023-08-12 DIAGNOSIS — E559 Vitamin D deficiency, unspecified: Secondary | ICD-10-CM | POA: Diagnosis not present

## 2023-08-14 DIAGNOSIS — K5010 Crohn's disease of large intestine without complications: Secondary | ICD-10-CM | POA: Diagnosis not present

## 2023-08-14 DIAGNOSIS — R1310 Dysphagia, unspecified: Secondary | ICD-10-CM | POA: Diagnosis not present

## 2023-08-14 DIAGNOSIS — K5730 Diverticulosis of large intestine without perforation or abscess without bleeding: Secondary | ICD-10-CM | POA: Diagnosis not present

## 2023-08-14 DIAGNOSIS — Z860101 Personal history of adenomatous and serrated colon polyps: Secondary | ICD-10-CM | POA: Diagnosis not present

## 2023-08-14 DIAGNOSIS — Z09 Encounter for follow-up examination after completed treatment for conditions other than malignant neoplasm: Secondary | ICD-10-CM | POA: Diagnosis not present

## 2023-08-14 DIAGNOSIS — K529 Noninfective gastroenteritis and colitis, unspecified: Secondary | ICD-10-CM | POA: Diagnosis not present

## 2023-08-14 DIAGNOSIS — K2930 Chronic superficial gastritis without bleeding: Secondary | ICD-10-CM | POA: Diagnosis not present

## 2023-08-14 DIAGNOSIS — K649 Unspecified hemorrhoids: Secondary | ICD-10-CM | POA: Diagnosis not present

## 2023-08-14 DIAGNOSIS — K219 Gastro-esophageal reflux disease without esophagitis: Secondary | ICD-10-CM | POA: Diagnosis not present

## 2023-08-18 DIAGNOSIS — R600 Localized edema: Secondary | ICD-10-CM | POA: Diagnosis not present

## 2023-08-18 DIAGNOSIS — R079 Chest pain, unspecified: Secondary | ICD-10-CM | POA: Diagnosis not present

## 2023-08-19 ENCOUNTER — Ambulatory Visit: Attending: Cardiology | Admitting: Cardiology

## 2023-08-19 ENCOUNTER — Telehealth: Payer: Self-pay | Admitting: Cardiology

## 2023-08-19 ENCOUNTER — Encounter: Payer: Self-pay | Admitting: Cardiology

## 2023-08-19 VITALS — BP 114/68 | HR 66 | Ht 69.0 in | Wt 211.8 lb

## 2023-08-19 DIAGNOSIS — I10 Essential (primary) hypertension: Secondary | ICD-10-CM | POA: Diagnosis not present

## 2023-08-19 DIAGNOSIS — E782 Mixed hyperlipidemia: Secondary | ICD-10-CM | POA: Insufficient documentation

## 2023-08-19 DIAGNOSIS — I209 Angina pectoris, unspecified: Secondary | ICD-10-CM | POA: Diagnosis not present

## 2023-08-19 DIAGNOSIS — Z01812 Encounter for preprocedural laboratory examination: Secondary | ICD-10-CM | POA: Diagnosis not present

## 2023-08-19 DIAGNOSIS — R072 Precordial pain: Secondary | ICD-10-CM | POA: Diagnosis not present

## 2023-08-19 DIAGNOSIS — E66811 Obesity, class 1: Secondary | ICD-10-CM | POA: Insufficient documentation

## 2023-08-19 MED ORDER — METOPROLOL TARTRATE 100 MG PO TABS: ORAL_TABLET | ORAL | 0 refills | Status: AC

## 2023-08-19 NOTE — Telephone Encounter (Signed)
No objections

## 2023-08-19 NOTE — Telephone Encounter (Signed)
 New Patient:       Patient would like to switch from Dr Edwyna to Dr Francyne. Is this alright with you?

## 2023-08-19 NOTE — Progress Notes (Signed)
 Cardiology Office Note:    Date:  08/19/2023   ID:  Vanessa Mills,  November 22, 1960, MRN [id number]  PCP:  Dyane Anthony RAMAN, FNP  Cardiologist:  Jennifer JONELLE Crape, MD   Referring MD: Dyane Anthony RAMAN, FNP    ASSESSMENT:    No diagnosis found. PLAN:    In order of problems listed above:  Primary prevention stressed with the patient.  Importance of compliance with diet medication stressed and patient verbalized standing. Chest pain: Angina pectoris: Chest pain is significant.  She has multiple risk factors for coronary artery disease.  She also needs preop assessment.  I think a CT FFR will help us  guide definitely and advise her surgeons about her risks.  She is agreeable.  I will set her up for this. Cardiac murmur: She had an echo done at primary care and we will try to get a copy of this.  This was done at [facility] physicians. Mixed dyslipidemia: On lipid-lowering medications followed by primary care.  Goal LDL will be decided based on findings of CAD. CT: Weight reduction stressed diet emphasized and she promises to do better. Patient will be seen in follow-up appointment in 9 months or earlier if the patient has any concerns.    Medication Adjustments/Labs and Tests Ordered: Current medicines are reviewed at length with the patient today.  Concerns regarding medicines are outlined above.  No orders of the defined types were placed in this encounter.  No orders of the defined types were placed in this encounter.    History of Present Illness:    Vanessa Mills is a 63 y.o. female who is being seen today for the evaluation of chest pain at the request of Dyane Anthony RAMAN, FNP.  Patient is a pleasant 63 year old female.  She has past medical history of essential hypertension and mixed dyslipidemia.  She mentions to me that she has chest discomfort at times.  This is not related to exertion.  No orthopnea or PND.  She leads a sedentary lifestyle because of orthopedic  issues and she needs a surgery on her back.  At the time of my evaluation, the patient is alert awake oriented and in no distress.  Past Medical History:  Diagnosis Date   Bipolar 1 disorder (HCC)    Breast cancer (HCC) 03/07/2011   Breast cancer, ILC, Left, receptor+, Her2- 10/10/2010   Chemotherapy follow-up examination 12/28/2010   Chronic mental illness    Crohn's disease (HCC)    Fatigue 12/28/2010   GERD (gastroesophageal reflux disease)    does not take medications for    Headache(784.0)    takes midodrine for migraines prn   Meniere's syndrome    Mental disorder    bipolar, takes saphris at hs   Movement disorder    PONV (postoperative nausea and vomiting)    Raynaud's disease    S/P radiation therapy 05/15/11 - 07/01/11   Left Breast: 4500 cGy/25 fractions with Boost to Left chest Wall/Mastectomy Scar for Toal dose of 5940 cGy   Status post chemotherapy    4 cycles AC   Status post chemotherapy 01/31/11 - 04/18/11   Taxol  x 12 weeks    Past Surgical History:  Procedure Laterality Date   2 orbital fracture surgeries     ABDOMINAL HYSTERECTOMY     uterus removed only   BLADDER SUSPENSION     done 2005   BREAST SURGERY     Axillary dissection   MASTECTOMY MODIFIED RADICAL  10/23/2010  Left Dr Merrilyn   [facility] REMOVAL  08/21/2011   Procedure: REMOVAL PORT-A-CATH;  Surgeon: Sherlean JINNY Merrilyn, MD;  Location: [facility];  Service: General;  Laterality: Right;   PORTACATH PLACEMENT  11/28/2010   via right subclavian - Dr Merrilyn   SINUS EXPLORATION      Current Medications: Current Meds  Medication Sig   alprazolam  (XANAX ) 2 MG tablet Take 2 mg by mouth 2 (two) times daily as needed.   amLODipine (NORVASC) 10 MG tablet Take 10 mg by mouth daily.   cloNIDine (CATAPRES - DOSED IN MG/24 HR) 0.2 mg/24hr patch Place 0.2 mg onto the skin once a week.   doxepin (SINEQUAN) 10 MG capsule Take 10 mg by mouth at bedtime.   hydrALAZINE  (APRESOLINE ) 25 MG tablet  Take 25 mg by mouth in the morning and at bedtime.   lisinopril  (ZESTRIL ) 40 MG tablet Take 40 mg by mouth daily.   Paliperidone  Palmitate ER (INVEGA  TRINZA) 546 MG/1.75ML SUSY Inject 546 mLs into the muscle every 3 (three) months.   pantoprazole (PROTONIX) 40 MG tablet Take 40 mg by mouth daily.     Allergies:   Patient has no known allergies.   Social History   Socioeconomic History   Marital status: Married    Spouse name: Dan    Number of children: 1   Years of education: Masters   Highest education level: Not on file  Occupational History   Occupation: unemployed  Tobacco Use   Smoking status: Never   Smokeless tobacco: Never  Vaping Use   Vaping status: Never Used  Substance and Sexual Activity   Alcohol use: Yes    Alcohol/week: 3.0 standard drinks of alcohol    Types: 2 Glasses of wine, 1 Unspecified drink type per week   Drug use: No    Comment: in college   Sexual activity: Yes    Comment: erpr+/HER-2 neg.  Other Topics Concern   Not on file  Social History Narrative   Married to [place]   One dtr- 65 years old- Manuelita- in [place]   Patient has a Masters   Patient  has 1 child.    Social Drivers of Corporate investment banker Strain: Low Risk  (11/15/2021)   Overall Financial Resource Strain (CARDIA)    Difficulty of Paying Living Expenses: Not hard at all  Food Insecurity: No Food Insecurity (11/15/2021)   Hunger Vital Sign    Worried About Running Out of Food in the Last Year: Never true    Ran Out of Food in the Last Year: Never true  Transportation Needs: No Transportation Needs (11/15/2021)   PRAPARE - Administrator, Civil Service (Medical): No    Lack of Transportation (Non-Medical): No  Physical Activity: Not on file  Stress: Not on file  Social Connections: Unknown (06/12/2021)   [facility]   Social Network    Social Network: Not on file     Family History: The patient's family history includes Cancer in her  maternal aunt; Diabetes in her mother; Heart attack in her sister; Heart failure in her father; Hypertension in her mother.  ROS:   Please see the history of present illness.    All other systems reviewed and are negative.  EKGs/Labs/Other Studies Reviewed:    The following studies were reviewed today: EKG reveals sinus rhythm and nonspecific ST changes       Recent Labs: 06/19/2023: BUN 9; Creatinine, Ser 0.86; Hemoglobin  15.2; Platelets 392; Potassium 4.1; Sodium 136  Recent Lipid Panel    Component Value Date/Time   CHOL 230 (H) 07/29/2016 0620   TRIG 92 07/29/2016 0620   HDL 100 07/29/2016 0620   CHOLHDL 2.3 07/29/2016 0620   VLDL 18 07/29/2016 0620   LDLCALC 112 (H) 07/29/2016 0620    Physical Exam:    VS:  BP 114/68 (BP Location: Left Arm, Patient Position: Sitting, Cuff Size: Normal)   Pulse 66   Ht 5' 9 (1.753 m)   Wt 211 lb 12.8 oz (96.1 kg)   SpO2 94%   BMI 31.28 kg/m     Wt Readings from Last 3 Encounters:  08/19/23 211 lb 12.8 oz (96.1 kg)  05/27/23 207 lb (93.9 kg)  11/25/22 210 lb (95.3 kg)     GEN: Patient is in no acute distress HEENT: Normal NECK: No JVD; No carotid bruits LYMPHATICS: No lymphadenopathy CARDIAC: S1 S2 regular, 2/6 systolic murmur at the apex. RESPIRATORY:  Clear to auscultation without rales, wheezing or rhonchi  ABDOMEN: Soft, non-tender, non-distended MUSCULOSKELETAL:  No edema; No deformity  SKIN: Warm and dry NEUROLOGIC:  Alert and oriented x 3 PSYCHIATRIC:  Normal affect    Signed, Jennifer JONELLE Crape, MD  08/19/2023 11:24 AM    [facility]

## 2023-08-19 NOTE — Patient Instructions (Addendum)
 Medication Instructions:  Your physician recommends that you continue on your current medications as directed. Please refer to the Current Medication list given to you today.  *If you need a refill on your cardiac medications before your next appointment, please call your pharmacy*  Lab Work: CMET, CBC  If you have labs (blood work) drawn today and your tests are completely normal, you will receive your results only by: MyChart Message (if you have MyChart) OR A paper copy in the mail If you have any lab test that is abnormal or we need to change your treatment, we will call you to review the results.  Testing/Procedures:   Your cardiac CT will be scheduled at one of the below locations:   [facility] [address] [place], [place] [address] [phone]  OR  [facility] and Vascular Tower [address]  [place], [place] [address]  OR   [facility] [address] [place],   If scheduled at [facility], please arrive at the [facility] and Children's Entrance (Entrance C2) of [facility] 30 minutes prior to test start time. You can use the FREE valet parking offered at entrance C (encouraged to control the heart rate for the test)  Proceed to the [facility] Radiology Department (first floor) to check-in and test prep.  All radiology patients and guests should use entrance C2 at [facility], accessed from [facility], even though the hospital's physical address listed is [address]  If scheduled at the Heart and Vascular Tower at [address], please enter the parking lot using the [address] entrance and use the FREE valet service at the patient drop-off area. Enter the buidling and check-in with registration on the main floor.  There is spacious parking and easy access to the radiology department from the [facility] Heart and Vascular entrance. Please enter here and check-in with the desk  attendant.   If scheduled at [facility] [address] [facility], please arrive 30 minutes early for check-in and test prep.  Please follow these instructions carefully (unless otherwise directed):  An IV will be required for this test and Nitroglycerin will be given.   On the Night Before the Test: Be sure to Drink plenty of water. Do not consume any caffeinated/decaffeinated beverages or chocolate 12 hours prior to your test. Do not take any antihistamines 12 hours prior to your test.  On the Day of the Test: Drink plenty of water until 1 hour prior to the test. Do not eat any food 1 hour prior to test. You may take your regular medications prior to the test.  Take metoprolol  (Lopressor ) two hours prior to test. If you take Furosemide/Hydrochlorothiazide/Spironolactone/Chlorthalidone, please HOLD on the morning of the test. Patients who wear a continuous glucose monitor MUST remove the device prior to scanning. FEMALES- please wear underwire-free bra if available, avoid dresses & tight clothing       After the Test: Drink plenty of water. After receiving IV contrast, you may experience a mild flushed feeling. This is normal. On occasion, you may experience a mild rash up to 24 hours after the test. This is not dangerous. If this occurs, you can take Benadryl  25 mg, Zyrtec, Claritin, or Allegra and increase your fluid intake. (Patients taking Tikosyn should avoid Benadryl , and may take Zyrtec, Claritin, or Allegra) If you experience trouble breathing, this can be serious. If it is severe call 911 IMMEDIATELY. If it is mild, please call our office.  We will call to schedule your test 2-4 weeks out understanding that some insurance companies will need an authorization prior to the service being performed.   For more information and frequently asked questions, please visit our website : [email or web address]  For non-scheduling related questions, please contact the cardiac imaging nurse  navigator should you have any questions/concerns: Cardiac Imaging Nurse Navigators Direct Office Dial: [phone]   For scheduling needs, including cancellations and rescheduling, please call [place], [phone].   Follow-Up: At [facility], you and your health needs are our priority.  As part of our continuing mission to provide you with exceptional heart care, our providers are all part of one team.  This team includes your primary Cardiologist (physician) and Advanced Practice Providers or APPs (Physician Assistants and Nurse Practitioners) who all work together to provide you with the care you need, when you need it.  Your next appointment:   9 month(s)  Provider:   Jennifer Crape, MD

## 2023-08-19 NOTE — Addendum Note (Signed)
 Addended by: MELIDA ROLIN HERO on: 08/19/2023 12:18 PM   Modules accepted: Orders

## 2023-08-22 DIAGNOSIS — K2930 Chronic superficial gastritis without bleeding: Secondary | ICD-10-CM | POA: Diagnosis not present

## 2023-08-26 ENCOUNTER — Telehealth (HOSPITAL_COMMUNITY): Payer: Self-pay | Admitting: *Deleted

## 2023-08-26 NOTE — Telephone Encounter (Signed)
 Attempted to call patient regarding upcoming cardiac CT appointment. Left message on voicemail with name and callback number Johney Frame RN Navigator Cardiac Imaging [facility] [phone] Office

## 2023-08-26 NOTE — Telephone Encounter (Signed)
 Reaching out to patient to offer assistance regarding upcoming cardiac imaging study; pt verbalizes understanding of appt date/time, parking situation and where to check in, pre-test NPO status and medications ordered, and verified current allergies; name and call back number provided for further questions should they arise Johney Frame RN Navigator Cardiac Imaging [facility] [phone] office [phone] cell

## 2023-08-27 ENCOUNTER — Ambulatory Visit: Payer: Self-pay | Admitting: Cardiology

## 2023-08-27 ENCOUNTER — Ambulatory Visit (HOSPITAL_COMMUNITY)
Admission: RE | Admit: 2023-08-27 | Discharge: 2023-08-27 | Disposition: A | Discharge status: Home / Self Care | Source: Ambulatory Visit | Attending: Cardiovascular Disease | Admitting: Cardiovascular Disease

## 2023-08-27 DIAGNOSIS — R079 Chest pain, unspecified: Secondary | ICD-10-CM | POA: Insufficient documentation

## 2023-08-27 DIAGNOSIS — R072 Precordial pain: Secondary | ICD-10-CM | POA: Insufficient documentation

## 2023-08-27 MED ORDER — IOHEXOL 350 MG/ML SOLN
100.0000 mL | Freq: Once | INTRAVENOUS | Status: AC | PRN
  Administered 2023-08-27: 100 mL via INTRAVENOUS

## 2023-08-27 MED ORDER — NITROGLYCERIN 0.4 MG SL SUBL
0.8000 mg | SUBLINGUAL_TABLET | Freq: Once | SUBLINGUAL | Status: AC
Start: 2023-08-27 — End: 2023-08-27
  Administered 2023-08-27: 0.8 mg via SUBLINGUAL

## 2023-09-09 DIAGNOSIS — M8589 Other specified disorders of bone density and structure, multiple sites: Secondary | ICD-10-CM | POA: Diagnosis not present

## 2023-09-09 DIAGNOSIS — M85832 Other specified disorders of bone density and structure, left forearm: Secondary | ICD-10-CM | POA: Diagnosis not present

## 2023-09-16 DIAGNOSIS — R918 Other nonspecific abnormal finding of lung field: Secondary | ICD-10-CM | POA: Diagnosis not present

## 2023-09-16 DIAGNOSIS — K5900 Constipation, unspecified: Secondary | ICD-10-CM | POA: Diagnosis not present

## 2023-09-26 DIAGNOSIS — M25562 Pain in left knee: Secondary | ICD-10-CM | POA: Diagnosis not present

## 2023-10-02 DIAGNOSIS — M25562 Pain in left knee: Secondary | ICD-10-CM | POA: Diagnosis not present

## 2023-10-06 DIAGNOSIS — M25562 Pain in left knee: Secondary | ICD-10-CM | POA: Diagnosis not present

## 2023-10-07 DIAGNOSIS — G43B0 Ophthalmoplegic migraine, not intractable: Secondary | ICD-10-CM | POA: Diagnosis not present

## 2023-10-07 DIAGNOSIS — D3132 Benign neoplasm of left choroid: Secondary | ICD-10-CM | POA: Diagnosis not present

## 2023-10-07 DIAGNOSIS — H2513 Age-related nuclear cataract, bilateral: Secondary | ICD-10-CM | POA: Diagnosis not present

## 2023-10-07 DIAGNOSIS — D3131 Benign neoplasm of right choroid: Secondary | ICD-10-CM | POA: Diagnosis not present

## 2023-10-07 DIAGNOSIS — H04123 Dry eye syndrome of bilateral lacrimal glands: Secondary | ICD-10-CM | POA: Diagnosis not present

## 2023-10-07 DIAGNOSIS — H10413 Chronic giant papillary conjunctivitis, bilateral: Secondary | ICD-10-CM | POA: Diagnosis not present

## 2023-11-21 ENCOUNTER — Ambulatory Visit: Admitting: Cardiovascular Disease

## 2023-11-26 DIAGNOSIS — H02133 Senile ectropion of right eye, unspecified eyelid: Secondary | ICD-10-CM | POA: Diagnosis not present

## 2023-12-16 DIAGNOSIS — L821 Other seborrheic keratosis: Secondary | ICD-10-CM | POA: Diagnosis not present

## 2023-12-16 DIAGNOSIS — Z86018 Personal history of other benign neoplasm: Secondary | ICD-10-CM | POA: Diagnosis not present

## 2023-12-16 DIAGNOSIS — L578 Other skin changes due to chronic exposure to nonionizing radiation: Secondary | ICD-10-CM | POA: Diagnosis not present

## 2023-12-16 DIAGNOSIS — L601 Onycholysis: Secondary | ICD-10-CM | POA: Diagnosis not present

## 2023-12-16 DIAGNOSIS — L814 Other melanin hyperpigmentation: Secondary | ICD-10-CM | POA: Diagnosis not present

## 2023-12-18 DIAGNOSIS — I10 Essential (primary) hypertension: Secondary | ICD-10-CM | POA: Diagnosis not present

## 2023-12-18 DIAGNOSIS — Z1272 Encounter for screening for malignant neoplasm of vagina: Secondary | ICD-10-CM | POA: Diagnosis not present

## 2023-12-19 DIAGNOSIS — F250 Schizoaffective disorder, bipolar type: Secondary | ICD-10-CM | POA: Diagnosis not present

## 2023-12-19 DIAGNOSIS — F411 Generalized anxiety disorder: Secondary | ICD-10-CM | POA: Diagnosis not present
